# Patient Record
Sex: Female | Born: 1960 | Race: Black or African American | Hispanic: No | Marital: Married | Smoking: Never smoker
Health system: Southern US, Community
[De-identification: ages and names within clinical notes are randomized; demographics above are authoritative.]

## PROBLEM LIST (undated history)

## (undated) DIAGNOSIS — M329 Systemic lupus erythematosus, unspecified: Secondary | ICD-10-CM

## (undated) DIAGNOSIS — J45909 Unspecified asthma, uncomplicated: Secondary | ICD-10-CM

## (undated) DIAGNOSIS — I10 Essential (primary) hypertension: Secondary | ICD-10-CM

## (undated) DIAGNOSIS — IMO0002 Reserved for concepts with insufficient information to code with codable children: Secondary | ICD-10-CM

## (undated) DIAGNOSIS — L438 Other lichen planus: Secondary | ICD-10-CM

## (undated) DIAGNOSIS — Z86711 Personal history of pulmonary embolism: Secondary | ICD-10-CM

## (undated) DIAGNOSIS — Z8619 Personal history of other infectious and parasitic diseases: Secondary | ICD-10-CM

## (undated) HISTORY — PX: TOTAL ABDOMINAL HYSTERECTOMY W/ BILATERAL SALPINGOOPHORECTOMY: SHX83

## (undated) HISTORY — DX: Essential (primary) hypertension: I10

## (undated) HISTORY — DX: Other lichen planus: L43.8

## (undated) HISTORY — DX: Systemic lupus erythematosus, unspecified: M32.9

## (undated) HISTORY — DX: Personal history of other infectious and parasitic diseases: Z86.19

## (undated) HISTORY — DX: Unspecified asthma, uncomplicated: J45.909

## (undated) HISTORY — DX: Personal history of pulmonary embolism: Z86.711

## (undated) HISTORY — DX: Reserved for concepts with insufficient information to code with codable children: IMO0002

---

## 2001-01-15 ENCOUNTER — Other Ambulatory Visit: Admission: RE | Admit: 2001-01-15 | Discharge: 2001-01-15 | Payer: Self-pay | Admitting: Obstetrics and Gynecology

## 2002-10-26 ENCOUNTER — Other Ambulatory Visit: Admission: RE | Admit: 2002-10-26 | Discharge: 2002-10-26 | Payer: Self-pay | Admitting: Obstetrics and Gynecology

## 2004-06-29 ENCOUNTER — Other Ambulatory Visit: Admission: RE | Admit: 2004-06-29 | Discharge: 2004-06-29 | Payer: Self-pay | Admitting: Obstetrics and Gynecology

## 2004-08-30 ENCOUNTER — Encounter (INDEPENDENT_AMBULATORY_CARE_PROVIDER_SITE_OTHER): Payer: Self-pay | Admitting: *Deleted

## 2004-08-30 ENCOUNTER — Ambulatory Visit (HOSPITAL_COMMUNITY): Admission: RE | Admit: 2004-08-30 | Discharge: 2004-08-30 | Payer: Self-pay | Admitting: Obstetrics and Gynecology

## 2007-10-07 ENCOUNTER — Encounter (INDEPENDENT_AMBULATORY_CARE_PROVIDER_SITE_OTHER): Payer: Self-pay | Admitting: Obstetrics and Gynecology

## 2007-10-08 ENCOUNTER — Inpatient Hospital Stay (HOSPITAL_COMMUNITY): Admission: RE | Admit: 2007-10-08 | Discharge: 2007-10-09 | Payer: Self-pay | Admitting: Obstetrics and Gynecology

## 2007-10-19 DIAGNOSIS — Z86711 Personal history of pulmonary embolism: Secondary | ICD-10-CM

## 2007-10-19 HISTORY — DX: Personal history of pulmonary embolism: Z86.711

## 2007-11-05 ENCOUNTER — Inpatient Hospital Stay (HOSPITAL_COMMUNITY): Admission: AD | Admit: 2007-11-05 | Discharge: 2007-11-12 | Payer: Self-pay | Admitting: Obstetrics and Gynecology

## 2007-11-05 ENCOUNTER — Ambulatory Visit: Payer: Self-pay | Admitting: Internal Medicine

## 2007-11-05 IMAGING — CT CT ANGIO CHEST
3 of 4 series · 18 of 30 positions shown · IV contrast (150ml omni/300%)
Comparison: No prior studies are available for comparison.

CLINICAL DATA: 46-year-old with shortness of breath. Pain on inspiration. Hysterectomy one month ago. Evaluate for pulmonary embolism.
CT ANGIOGRAPHY OF CHEST:
TECHNIQUE: Multidetector CT imaging of the chest was performed during bolus injection of intravenous contrast.  Multiplanar CT angiographic image reconstructions were generated to evaluate the vascular anatomy.
Contrast:  150 cc Omnipaque 300.

[Series 4: recon 3: pe chest · axial · 0.58mm/px · z∈[-213,-24]mm · 11 of 231 slices shown]
[im 21/231  lung]
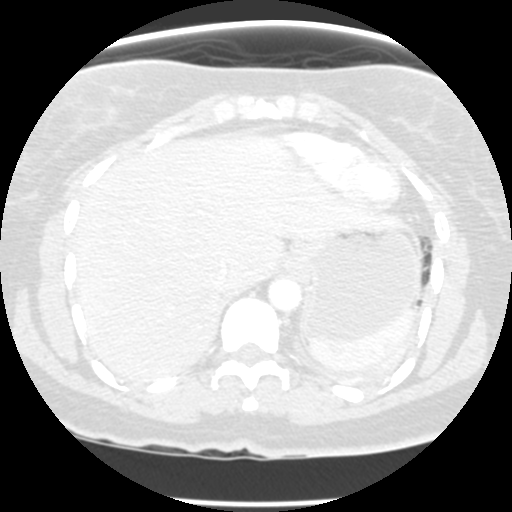
[im 42/231  mediastinal]
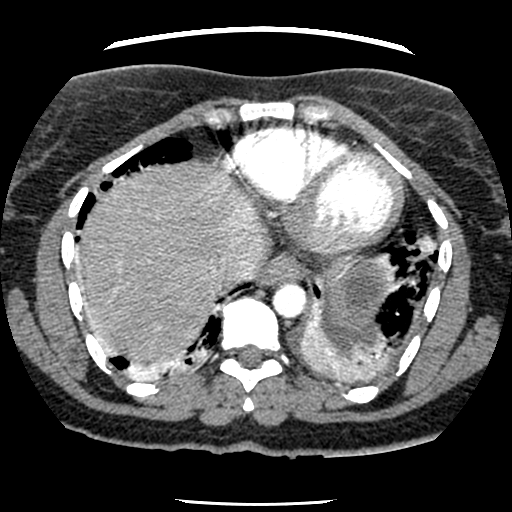
[im 63/231  lung]
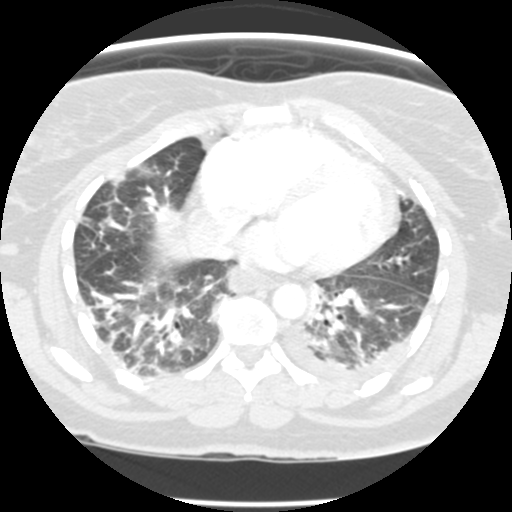
[im 84/231  mediastinal]
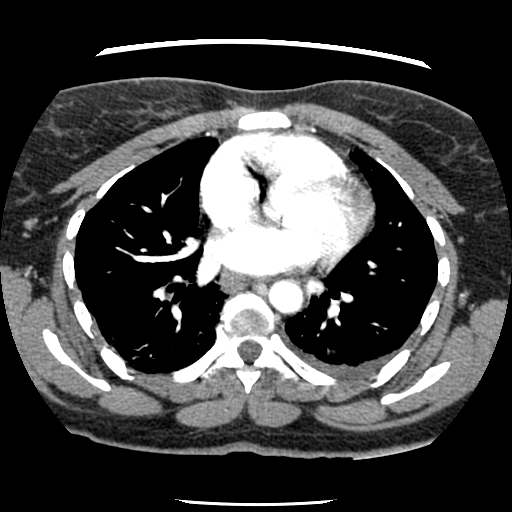
[im 105/231  lung]
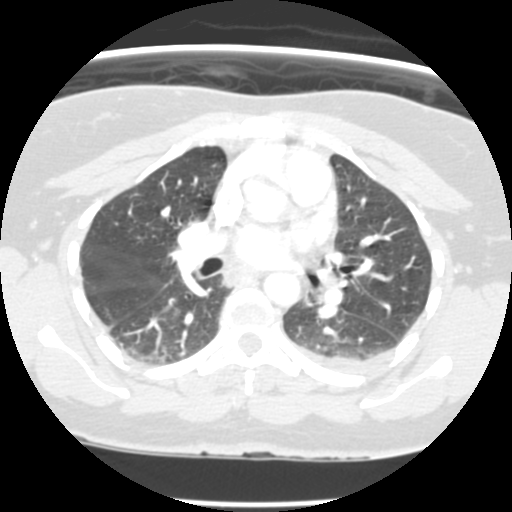
[im 116/231  mediastinal]
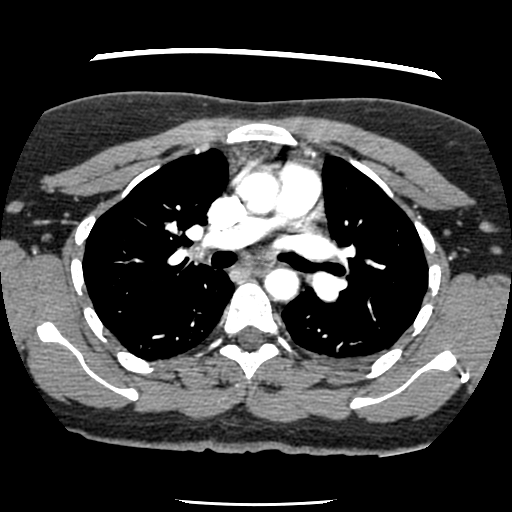
[im 126/231  lung]
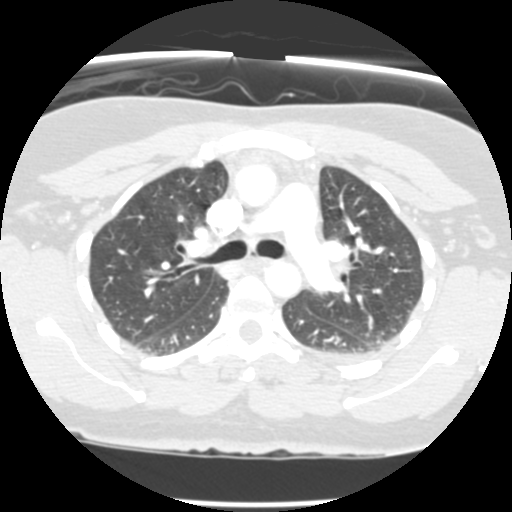
[im 147/231  mediastinal]
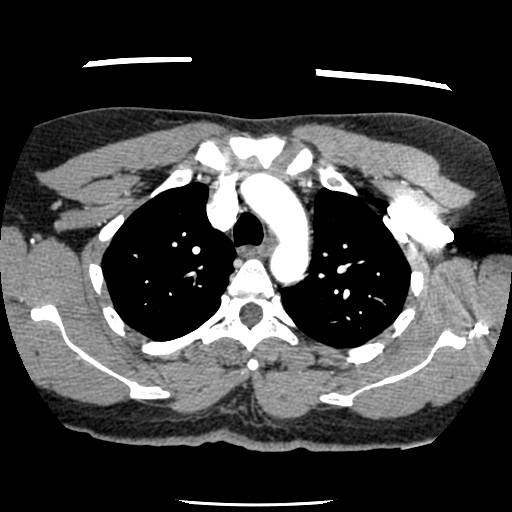
[im 168/231  lung]
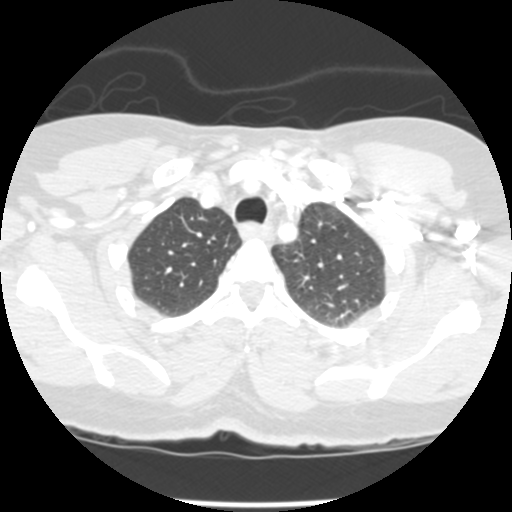
[im 189/231  mediastinal]
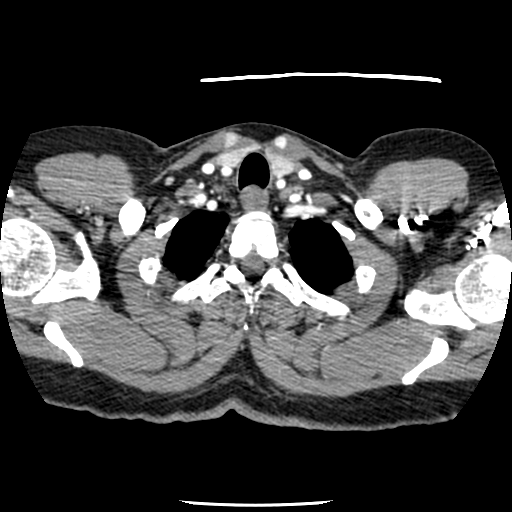
[im 210/231  lung]
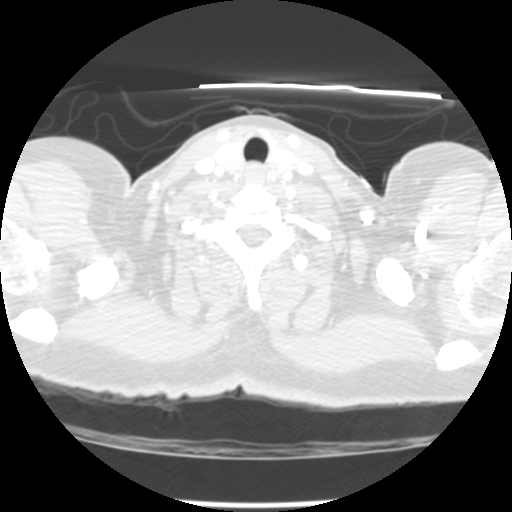

[Series 400: reformatted · coronal · 0.58mm/px · 3 of 86 slices shown (1 of 2)]
[im 22/86  lung]
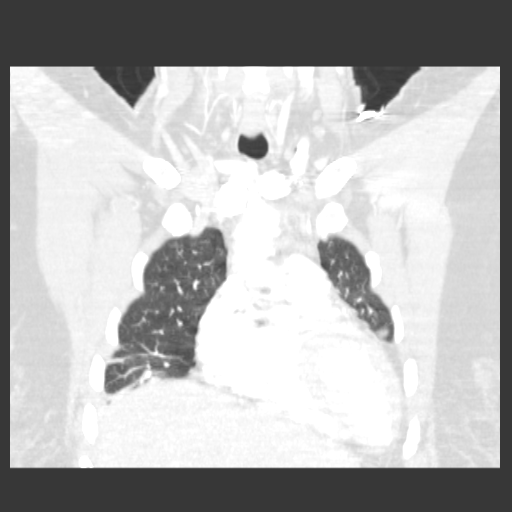
[im 43/86  lung]
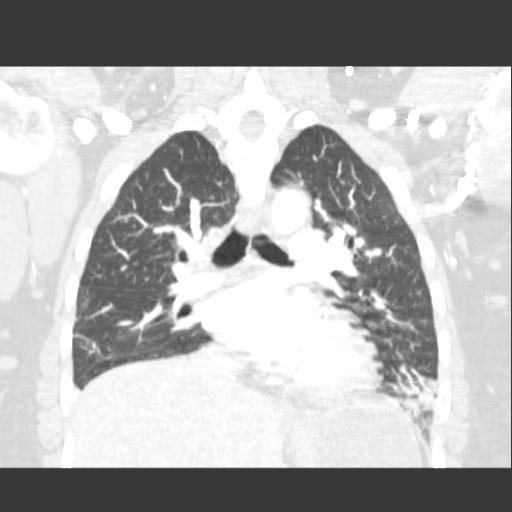
[im 64/86  lung]
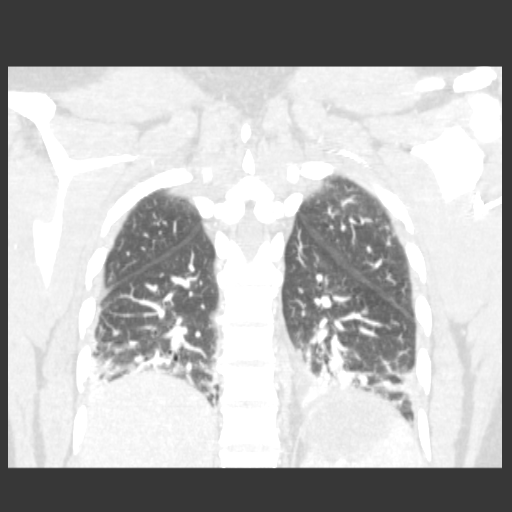

[Series 401: reformatted · sagittal · 0.58mm/px · 4 of 121 slices shown (2 of 2)]
[im 25/121  lung]
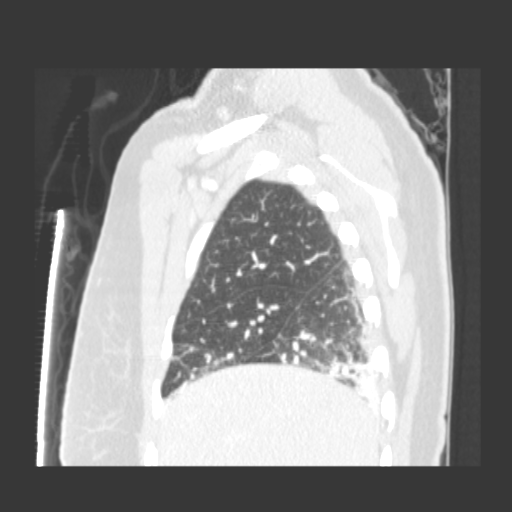
[im 49/121  lung]
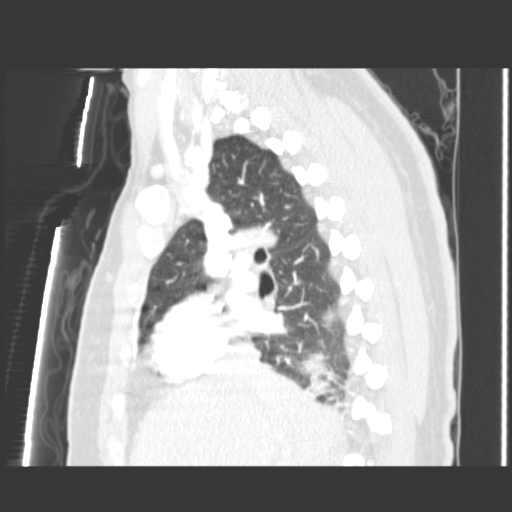
[im 73/121  lung]
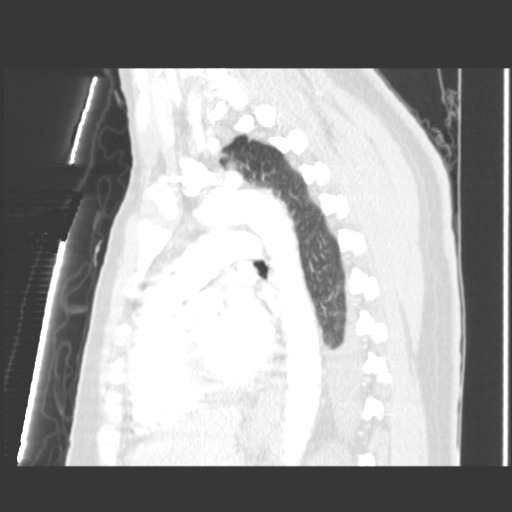
[im 97/121  lung]
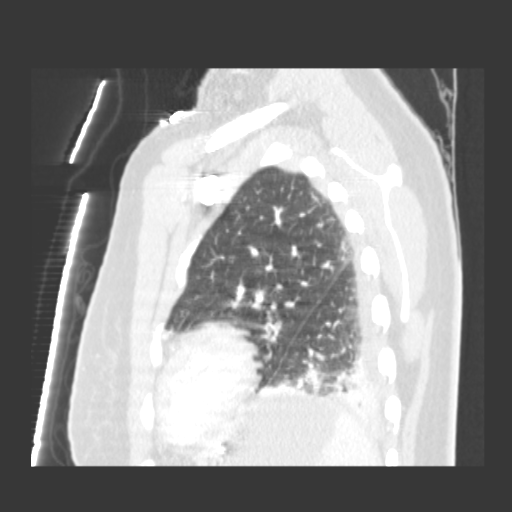

[18 of 30 positions shown; findings below may reference images not displayed]

FINDINGS: There are filling defects in a subsegmental branch of the left lower lobe (images #[id number] of series 4), consistent with pulmonary embolism. Additionally, there are filling defects in a subsegmental branch of the right lower lobe (image #[id number] of series 4).  There is significant atelectasis at both lung bases, somewhat limiting evaluation of some of the additional subsegmental pulmonary arteries.
No filling defects are identified in the main pulmonary arteries or the lobar branches.  No definite evidence of right heart strain is identified.  There is a small left pleural effusion. 
Extensive atelectasis in both lower lobe and focal areas of atelectasis in the right middle lobe and lingula. No discrete consolidation is identified. The trachea and mainstem bronchi are patent. The visualized thoracic spine vertebral bodies are aligned. The vertebral bodies are normal in height. 
The thoracic aorta is normal in caliber and contour. No dissection is identified. 
No mediastinal or hilar lymphadenopathy is identified.
IMPRESSION: 1.  Positive for pulmonary emboli to both lower lobes. This finding was called to Dr. PREM at [DATE] a.m. on [DATE].
2.  Small left pleural effusion.
3.  Extensive bilateral lower lobe atelectasis and scattered atelectasis in the right middle lobe and lingula.

## 2007-11-06 ENCOUNTER — Encounter (INDEPENDENT_AMBULATORY_CARE_PROVIDER_SITE_OTHER): Payer: Self-pay | Admitting: Internal Medicine

## 2007-11-06 ENCOUNTER — Ambulatory Visit: Payer: Self-pay | Admitting: Surgery

## 2007-11-06 IMAGING — CR DG CHEST 1V PORT
1 series · 1 of 1 positions shown · non-contrast
Comparison: CT chest [DATE].

CLINICAL DATA: Pulmonary embolism.  
 PORTABLE CHEST - 1 VIEW [DATE]:

[view not recorded]
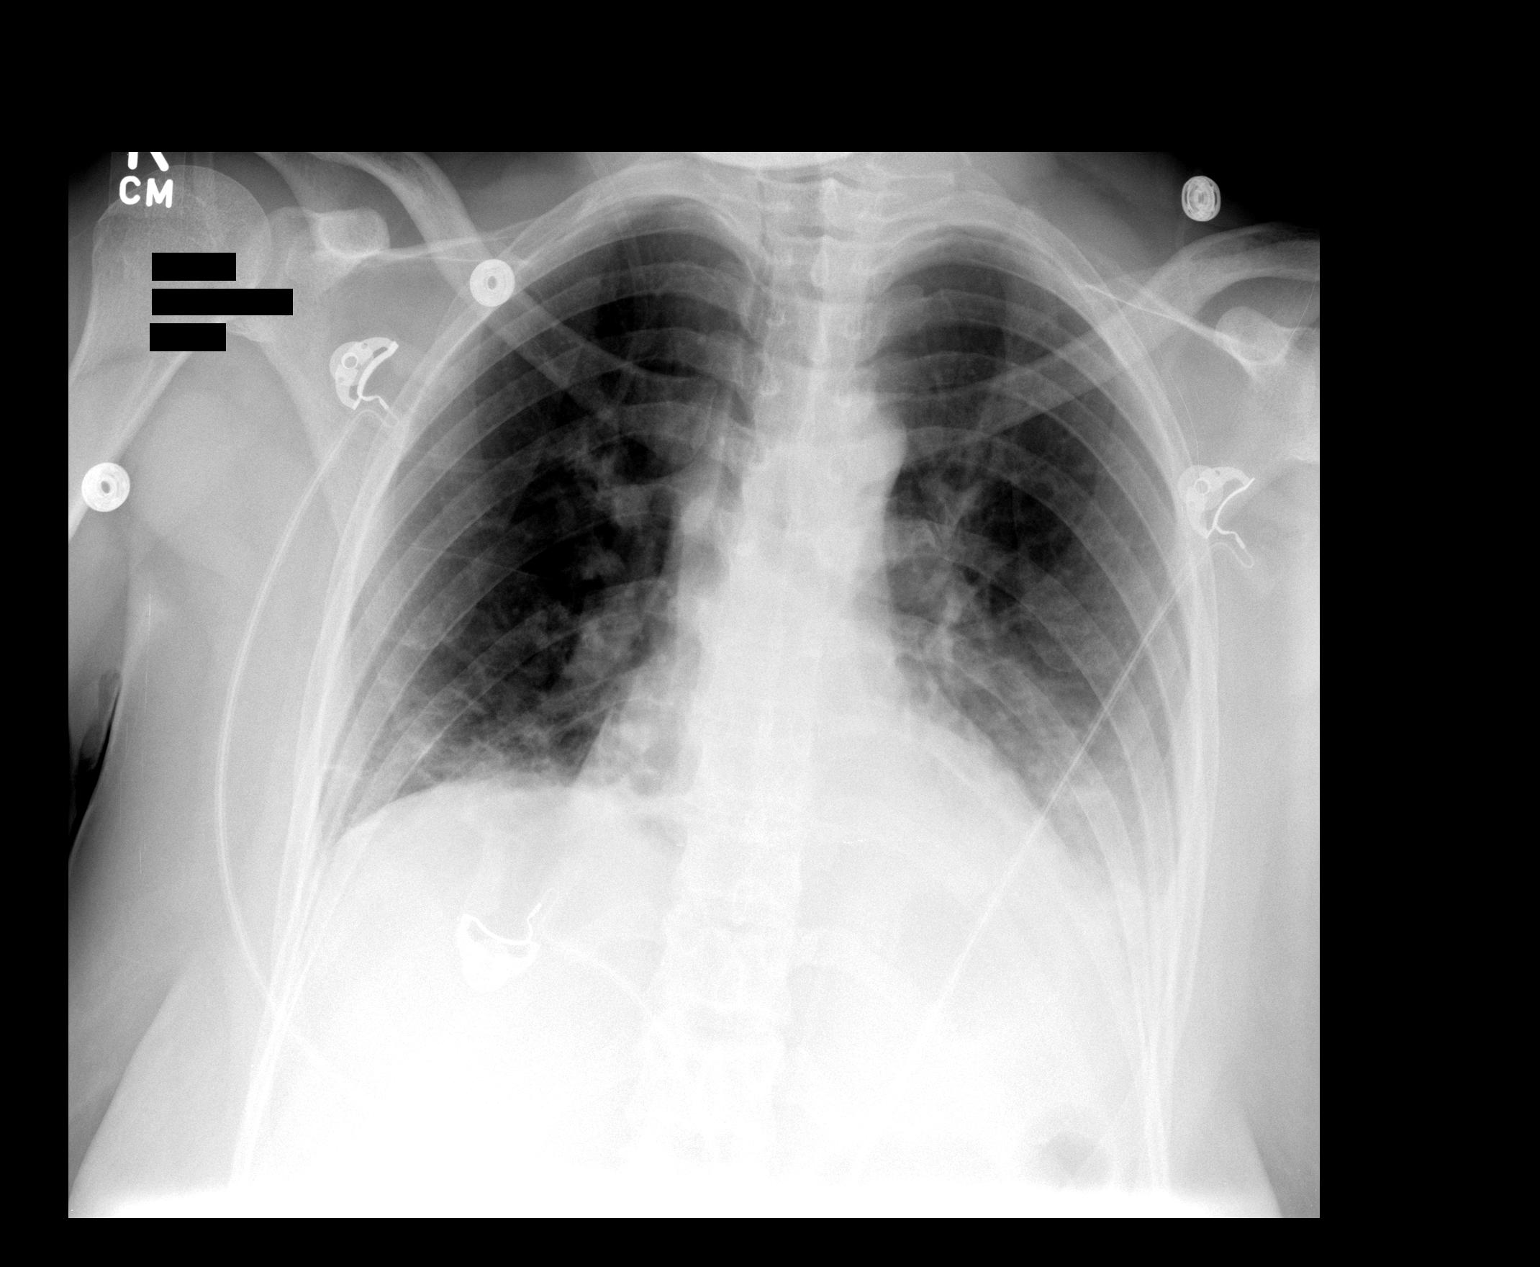

[1 of 1 positions shown; findings below may reference images not displayed]

FINDINGS: Trachea is midline.  Heart is at the upper limits of normal to mildly enlarged.  Left pleural effusion and bibasilar atelectasis.
IMPRESSION: Left pleural effusion and bibasilar atelectasis.

## 2007-11-08 ENCOUNTER — Encounter: Payer: Self-pay | Admitting: Internal Medicine

## 2007-11-08 IMAGING — CR DG CHEST 1V PORT
1 series · 1 of 1 positions shown · non-contrast
Comparison: [DATE]

CLINICAL DATA: Pulmonary embolism, followup.    
 PORTABLE CHEST - 1 VIEW:

[view not recorded]
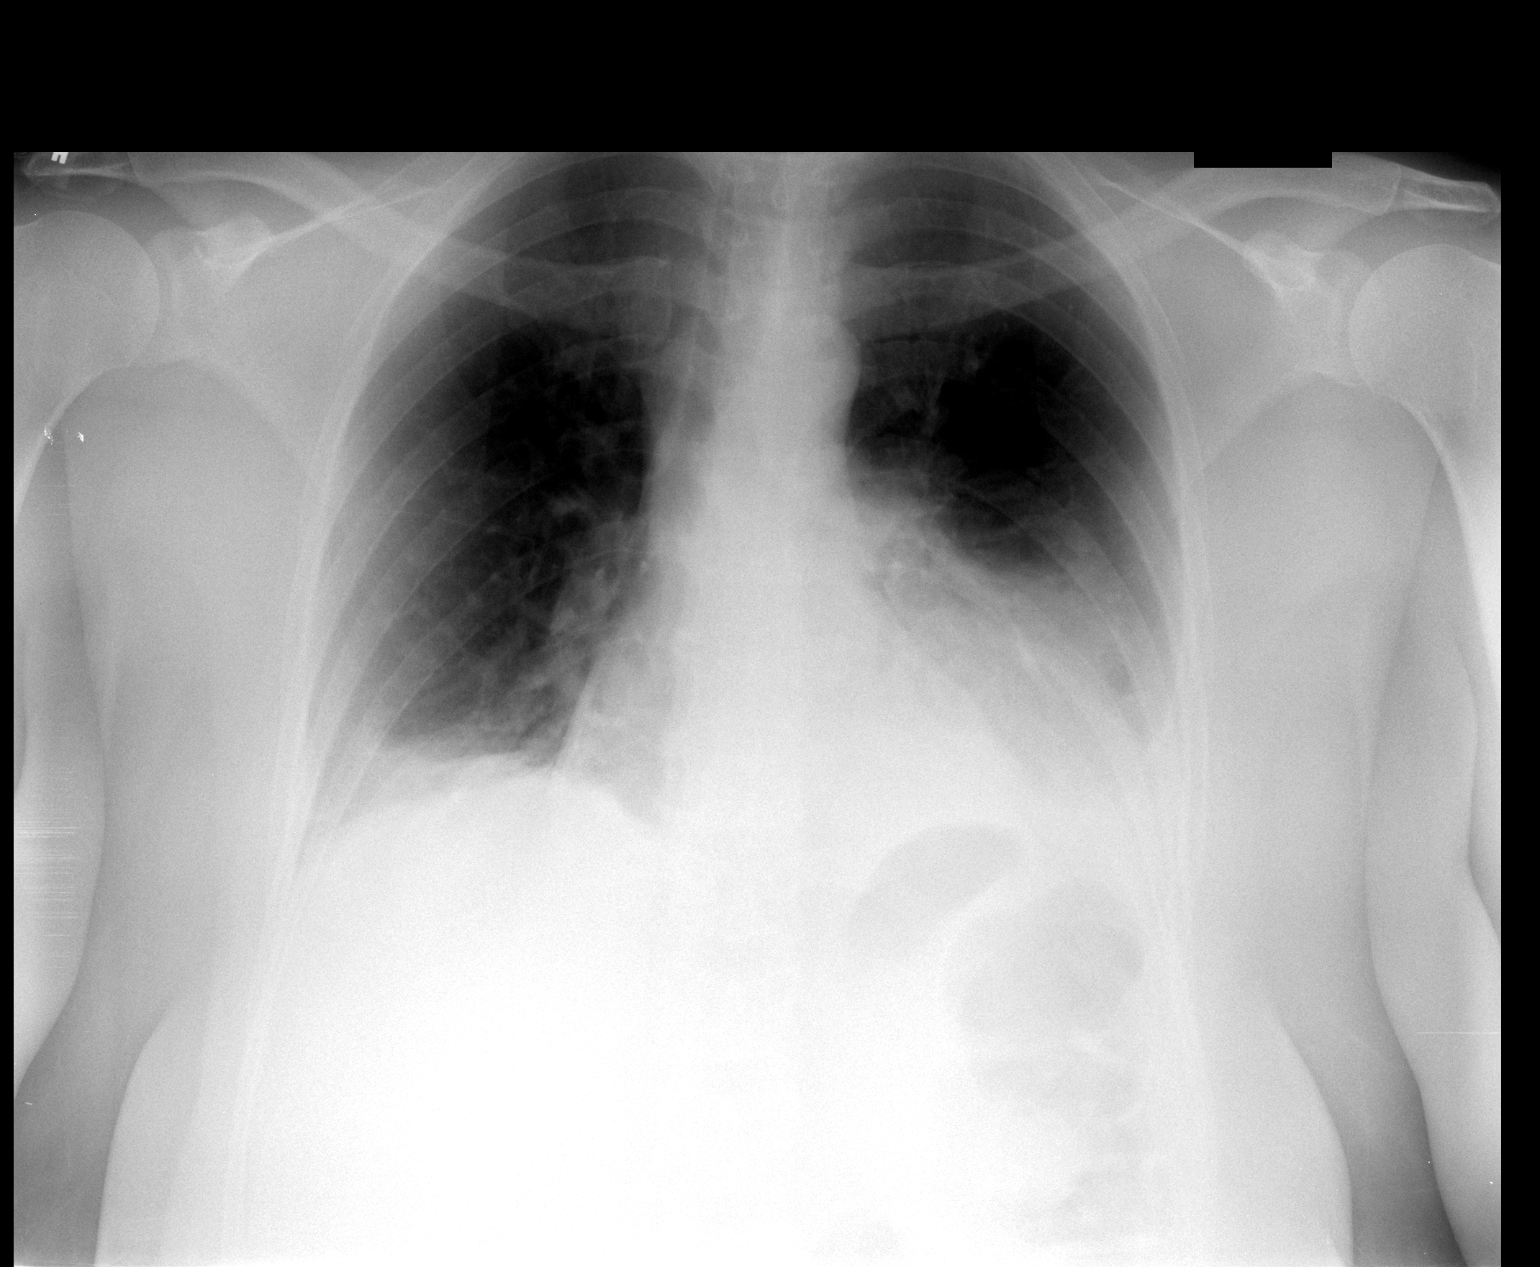

[1 of 1 positions shown; findings below may reference images not displayed]

FINDINGS: Heart size is normal.  Lung volumes are suboptimal.  Bibasilar atelectasis and trace effusions.  No change.
IMPRESSION: No change in trace effusions.

## 2007-11-10 IMAGING — CR DG CHEST 2V
2 series · 2 of 2 positions shown · non-contrast
Comparison: [DATE] and CT chest [DATE].

CLINICAL DATA: Pulmonary embolism.  Shortness of breath.
 [AP] VIEWS:

[w chest pa]
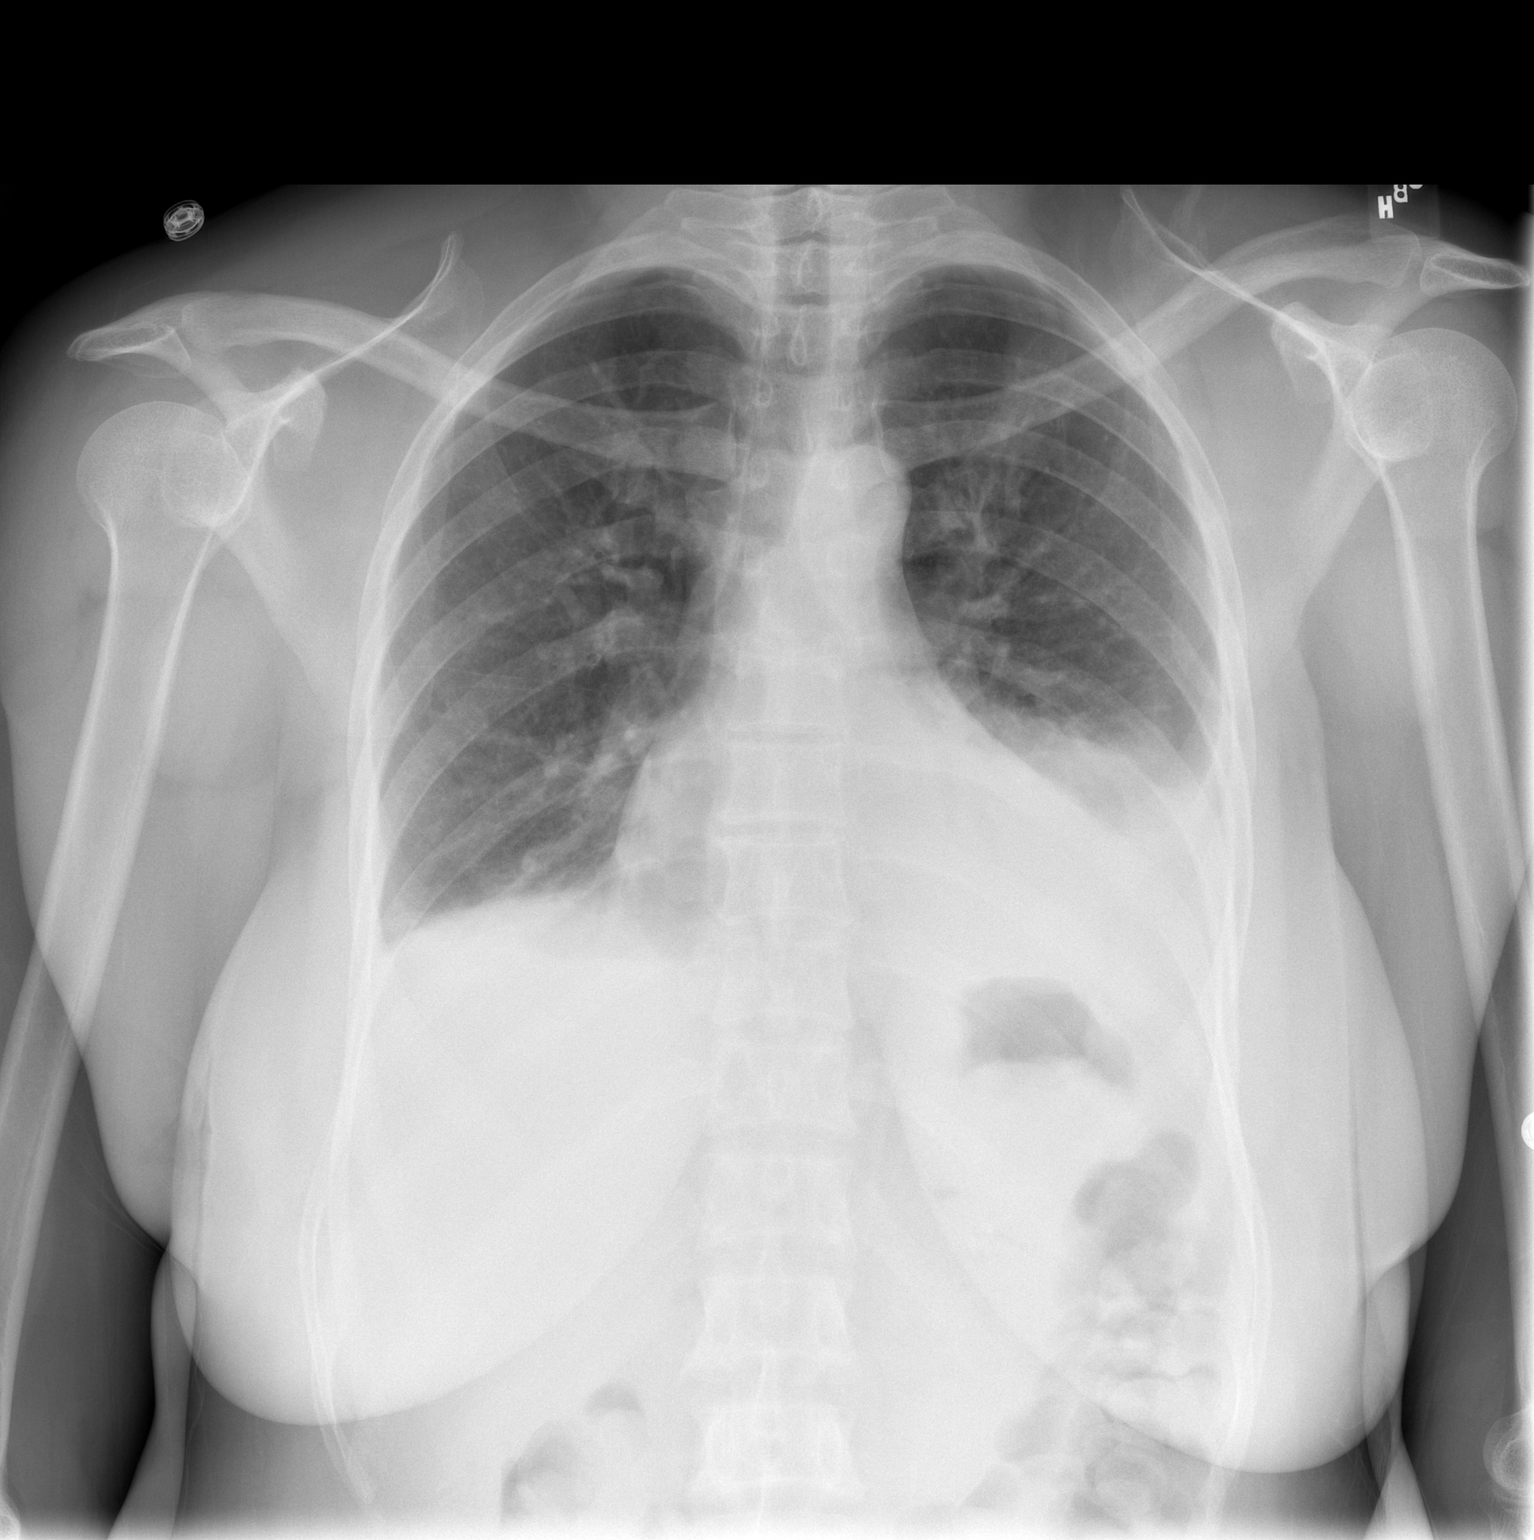

[w chest lat]
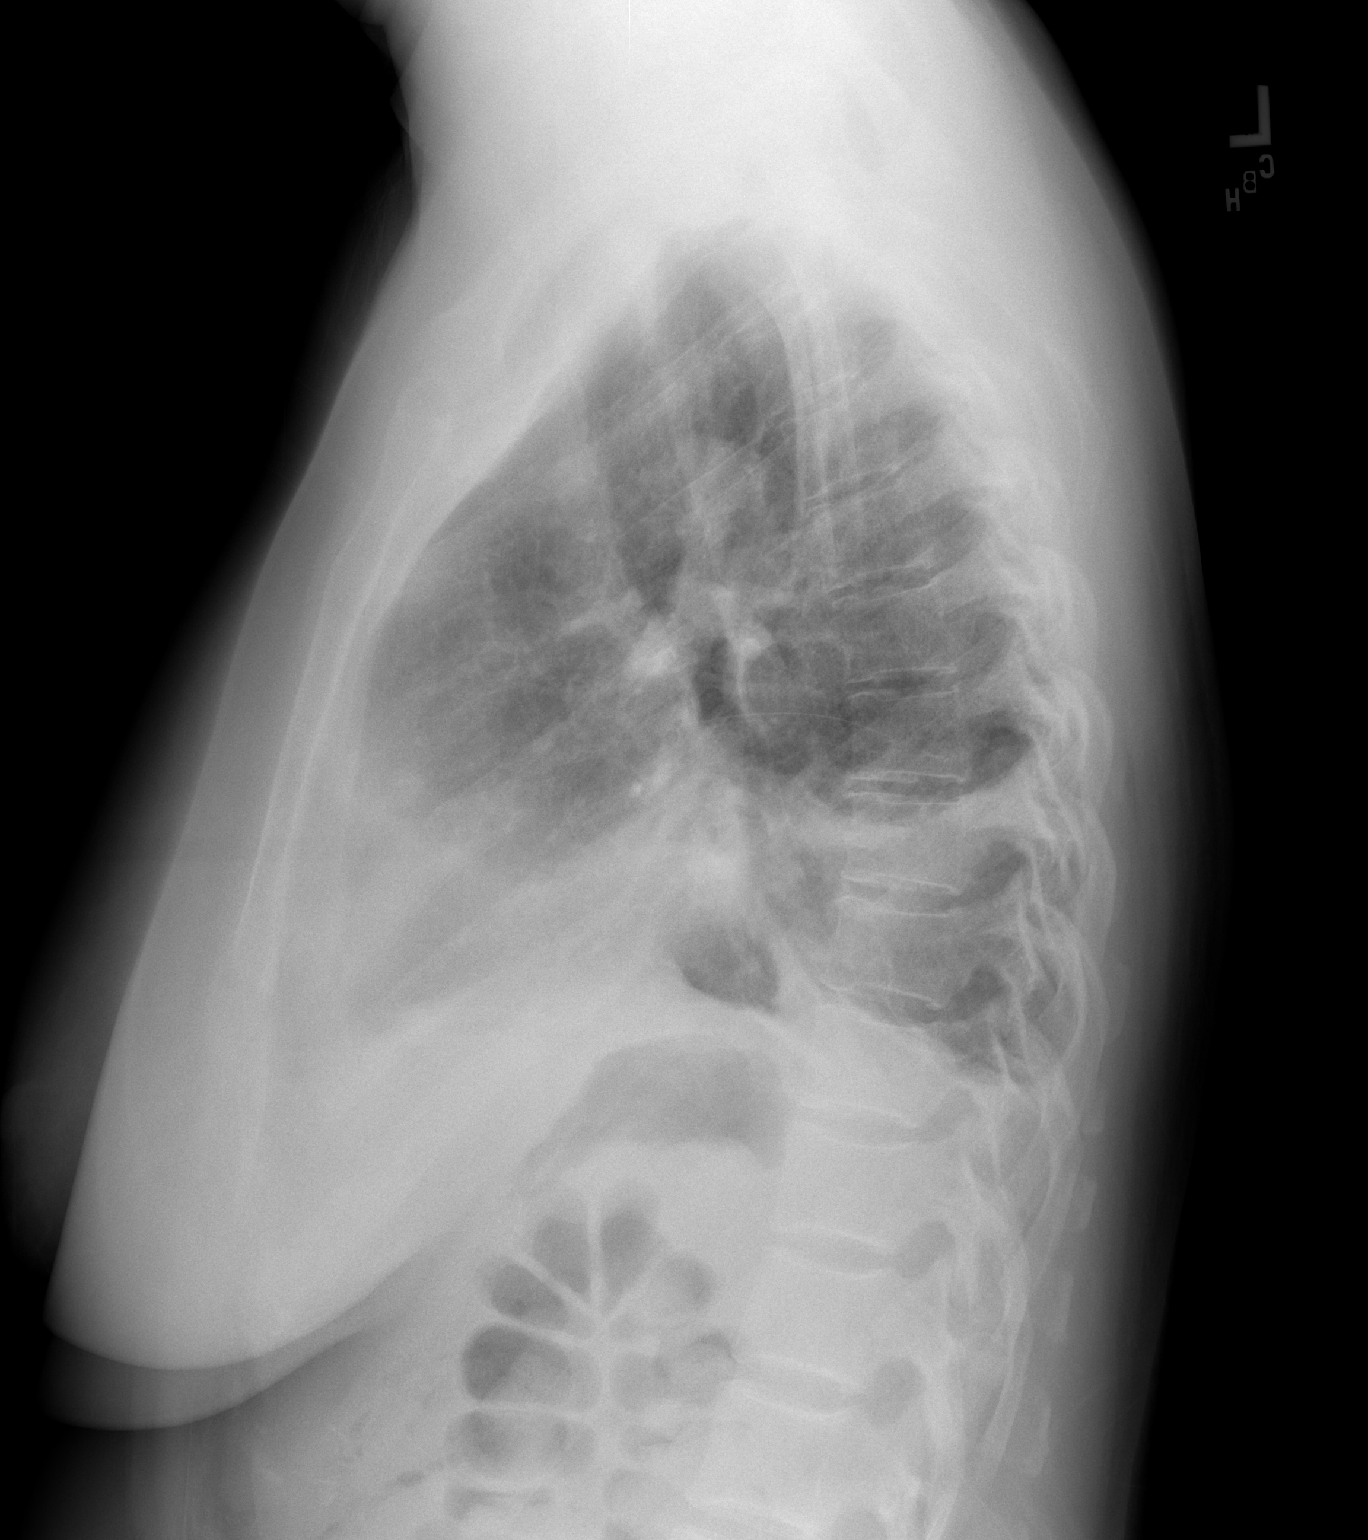

[2 of 2 positions shown; findings below may reference images not displayed]

FINDINGS: Left pleural effusion and left lower lobe airspace disease.  Trace right pleural effusion.
IMPRESSION: 1.  Left pleural effusion and left lower lobe airspace disease which may represent pulmonary infarction.
 2.  Trace right pleural effusion.

## 2007-11-11 IMAGING — CT CT ANGIO CHEST
2 series · 19 of 30 positions shown · IV contrast (100 ML OMNI 300)
Comparison: CT [DATE].

CLINICAL DATA: Left-sided chest pain.  Known pulmonary embolism. 
CT ANGIOGRAPHY OF CHEST ? [DATE]:
TECHNIQUE: Multidetector CT imaging of the chest was performed during bolus injection of intravenous contrast.  Multiplanar CT angiographic image reconstructions were generated to evaluate the vascular anatomy.
Contrast:  100 cc Omnipaque 300.

[Series 201: reformatted · sagittal · 0.60mm/px · 17 of 135 slices shown (1 of 2)]
[im 9/135  lung]
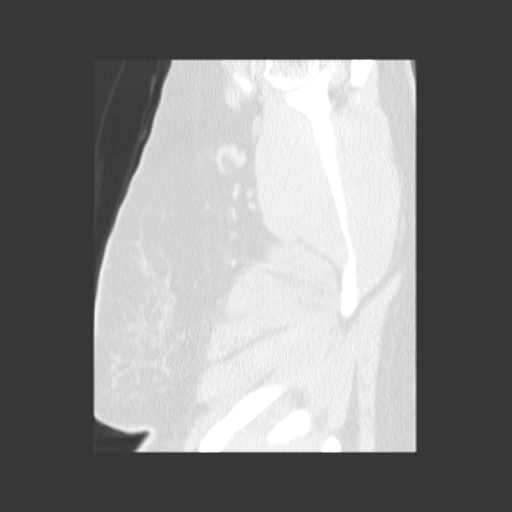
[im 17/135  mediastinal]
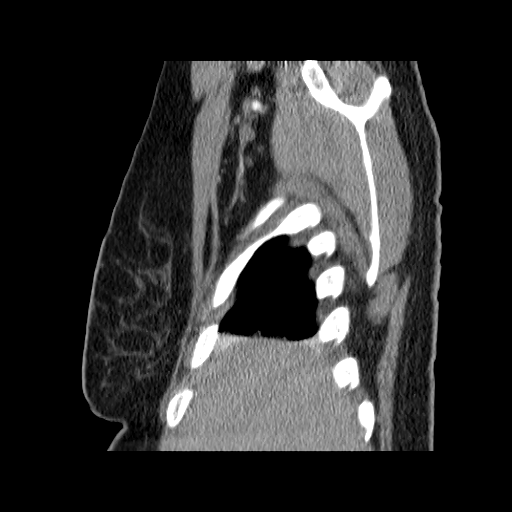
[im 26/135  lung]
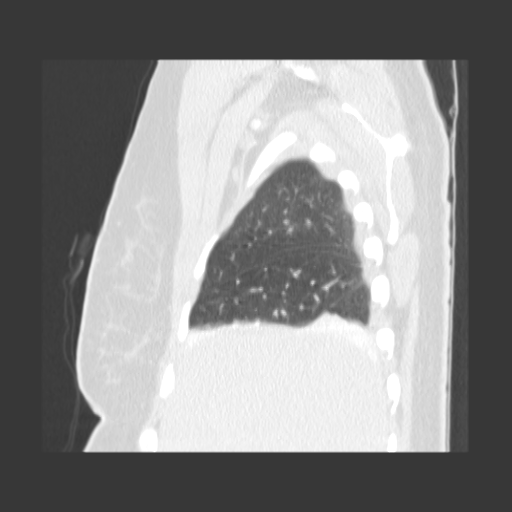
[im 34/135  mediastinal]
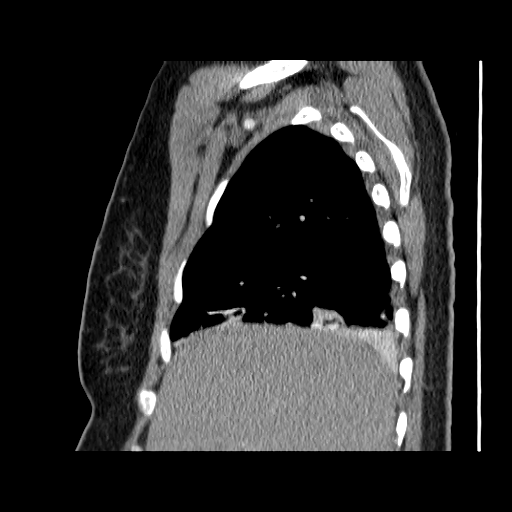
[im 42/135  lung]
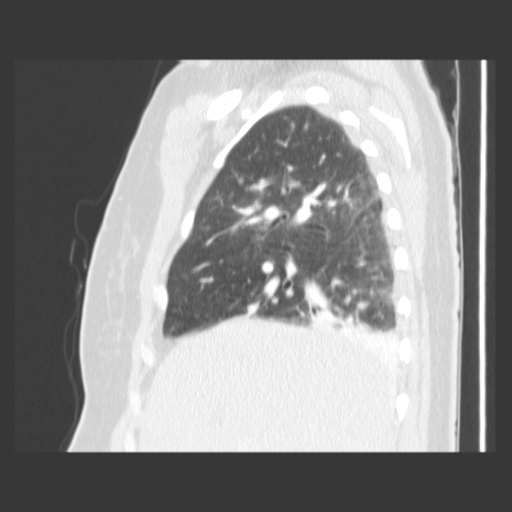
[im 45/135  mediastinal]
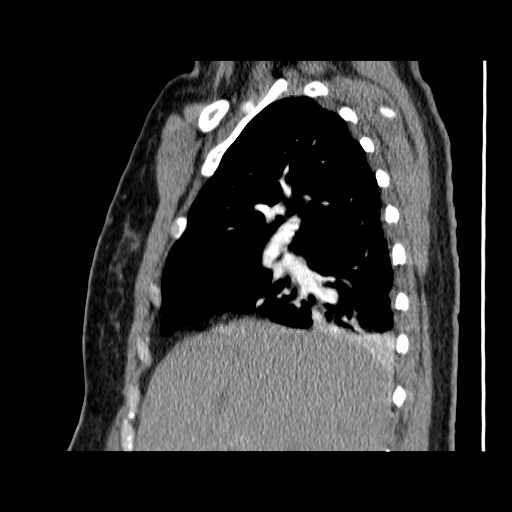
[im 51/135  lung]
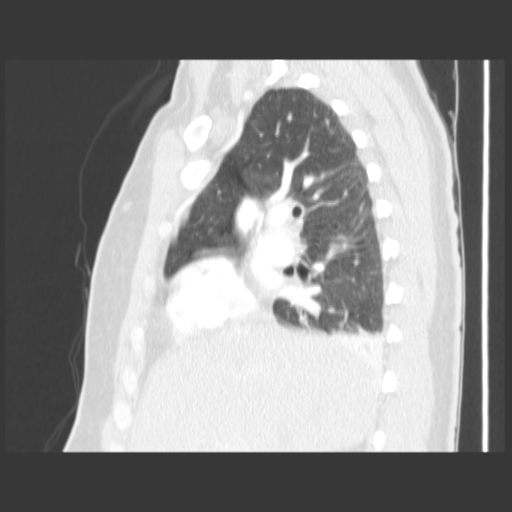
[im 59/135  mediastinal]
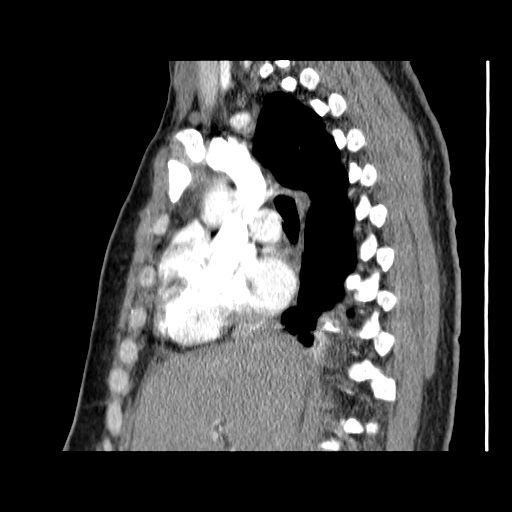
[im 68/135  lung]
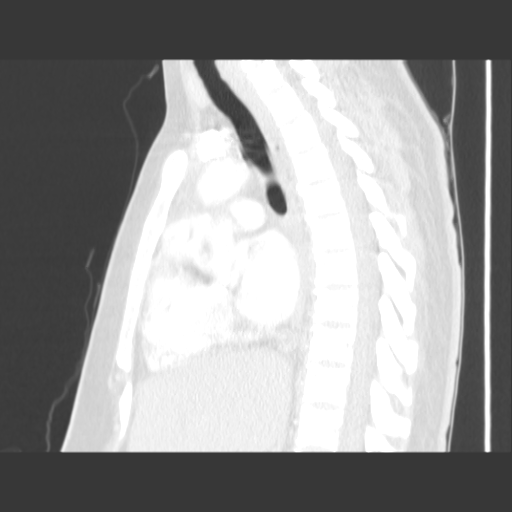
[im 76/135  mediastinal]
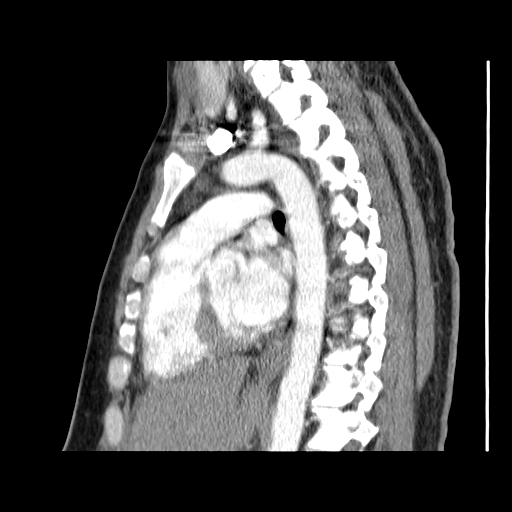
[im 84/135  lung]
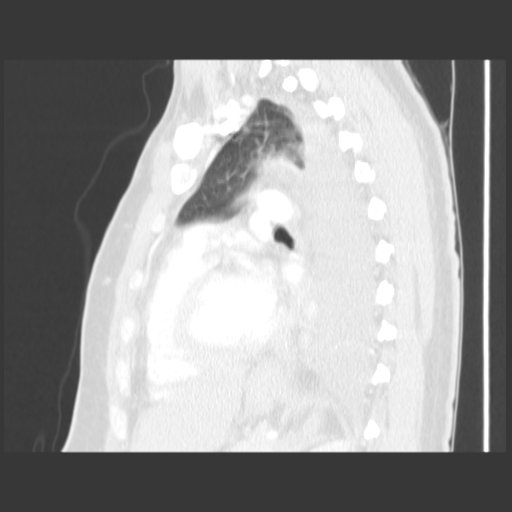
[im 90/135  mediastinal]
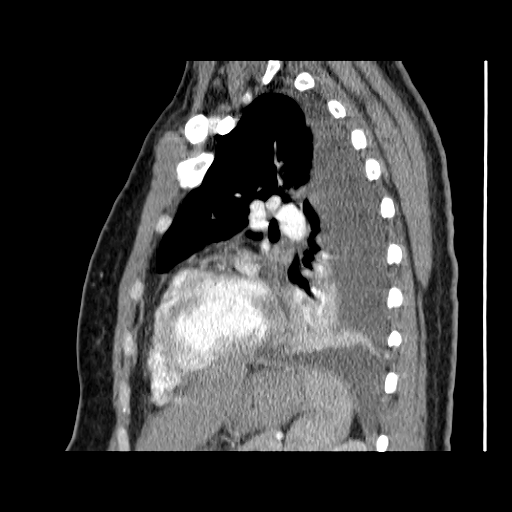
[im 93/135  lung]
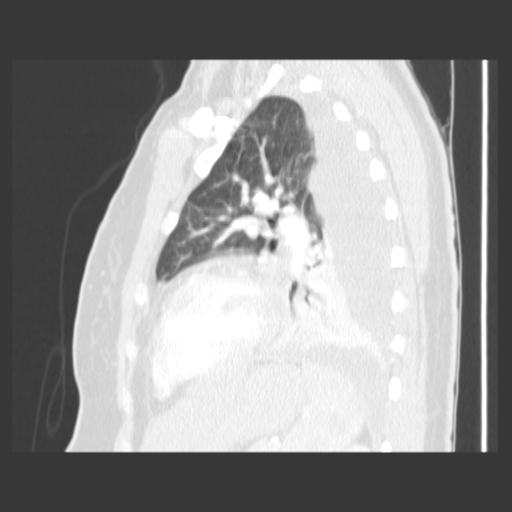
[im 101/135  mediastinal]
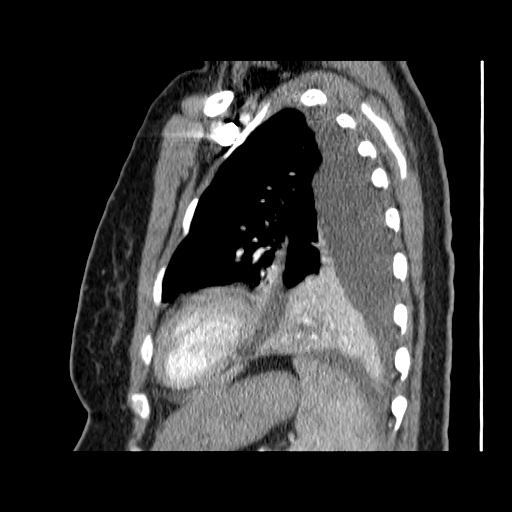
[im 109/135  lung]
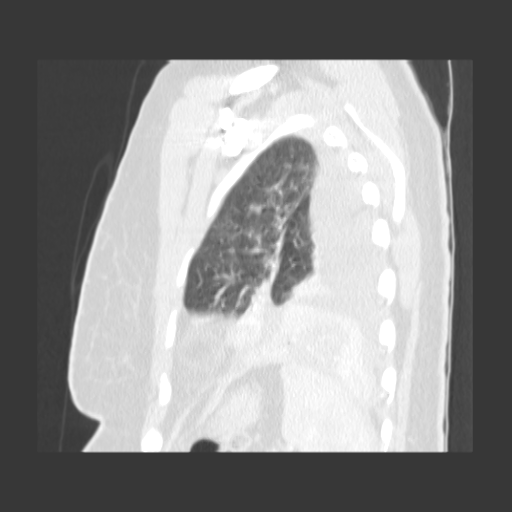
[im 118/135  mediastinal]
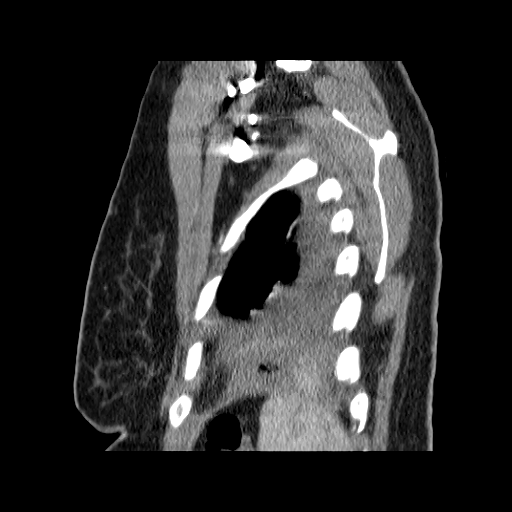
[im 126/135  lung]
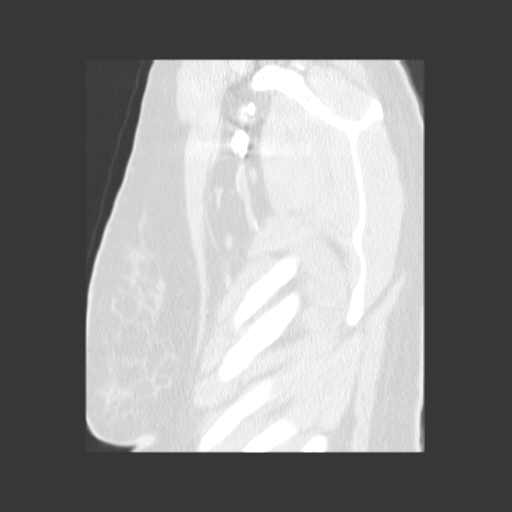

[Series 202: reformatted · coronal · 0.60mm/px · 2 of 92 slices shown (2 of 2)]
[im 10/92  lung]
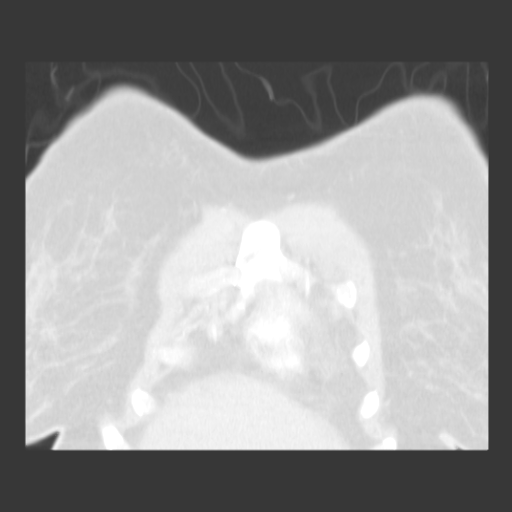
[im 19/92  lung]
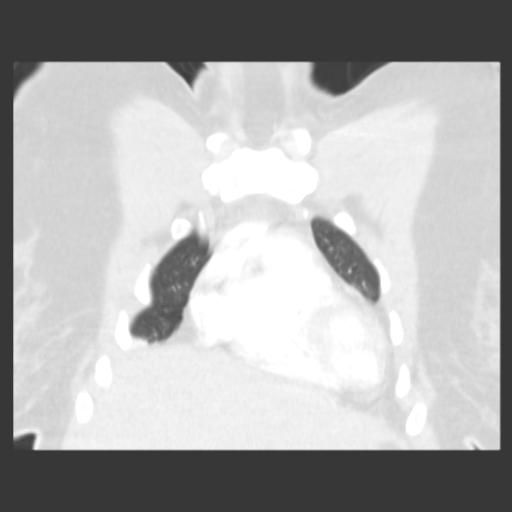

[19 of 30 positions shown; findings below may reference images not displayed]

FINDINGS: The examination is suboptimal for evaluation for pulmonary emboli due to early bolus timing.  The main and second order pulmonary arteries are adequately evaluated but more distal subsegmental embolism may not be as readily apparent.  No new central filling defect is seen to suggest acute pulmonary embolism.  The previously seen subsegmental emboli are not identified today, possibly due to early bolus timing, clot resorption or both.  Left pleural effusion has increased in size, now moderate.  There is heterogeneous perfusion to the atelectatic left lower lobe, suggestive of infarct.  Bibasilar atelectasis has increased since the prior study.   Heart size is at the upper limits of normal.  There is no evidence of evidence of right heart strain.
IMPRESSION: 1.  No new central pulmonary embolism identified, although the study is suboptimal for evaluation of more distal subsegmental emboli.  
2.  Increased bibasilar atelectasis, with suggestion of pulmonary infarct with heterogeneous perfusion to the left lower lobe.  
3.  Increased, now moderate-sized left pleural effusion.  
4.  No evidence for right heart strain.

## 2007-11-14 ENCOUNTER — Ambulatory Visit: Payer: Self-pay | Admitting: Cardiology

## 2007-11-18 ENCOUNTER — Ambulatory Visit: Payer: Self-pay | Admitting: Pulmonary Disease

## 2007-11-18 DIAGNOSIS — Z86711 Personal history of pulmonary embolism: Secondary | ICD-10-CM | POA: Insufficient documentation

## 2007-11-18 DIAGNOSIS — I2699 Other pulmonary embolism without acute cor pulmonale: Secondary | ICD-10-CM | POA: Insufficient documentation

## 2007-11-19 ENCOUNTER — Ambulatory Visit: Payer: Self-pay | Admitting: Internal Medicine

## 2007-11-19 LAB — CONVERTED CEMR LAB: Prothrombin Time: 20.1 s — ABNORMAL HIGH (ref 10.9–13.3)

## 2007-11-24 ENCOUNTER — Telehealth (INDEPENDENT_AMBULATORY_CARE_PROVIDER_SITE_OTHER): Payer: Self-pay | Admitting: *Deleted

## 2007-11-27 ENCOUNTER — Ambulatory Visit: Payer: Self-pay | Admitting: Internal Medicine

## 2007-12-04 ENCOUNTER — Ambulatory Visit: Payer: Self-pay | Admitting: Cardiology

## 2007-12-18 ENCOUNTER — Ambulatory Visit: Payer: Self-pay | Admitting: Cardiology

## 2007-12-23 ENCOUNTER — Ambulatory Visit: Payer: Self-pay | Admitting: Internal Medicine

## 2008-01-08 ENCOUNTER — Ambulatory Visit: Payer: Self-pay | Admitting: Internal Medicine

## 2008-01-16 ENCOUNTER — Ambulatory Visit: Payer: Self-pay | Admitting: Pulmonary Disease

## 2008-01-16 IMAGING — CR DG CHEST 2V
2 series · 2 of 2 positions shown · non-contrast
Comparison: [DATE]

CLINICAL DATA: Pulmonary embolus follow-up

CHEST - 2 VIEW

[view not recorded (1 of 2)]
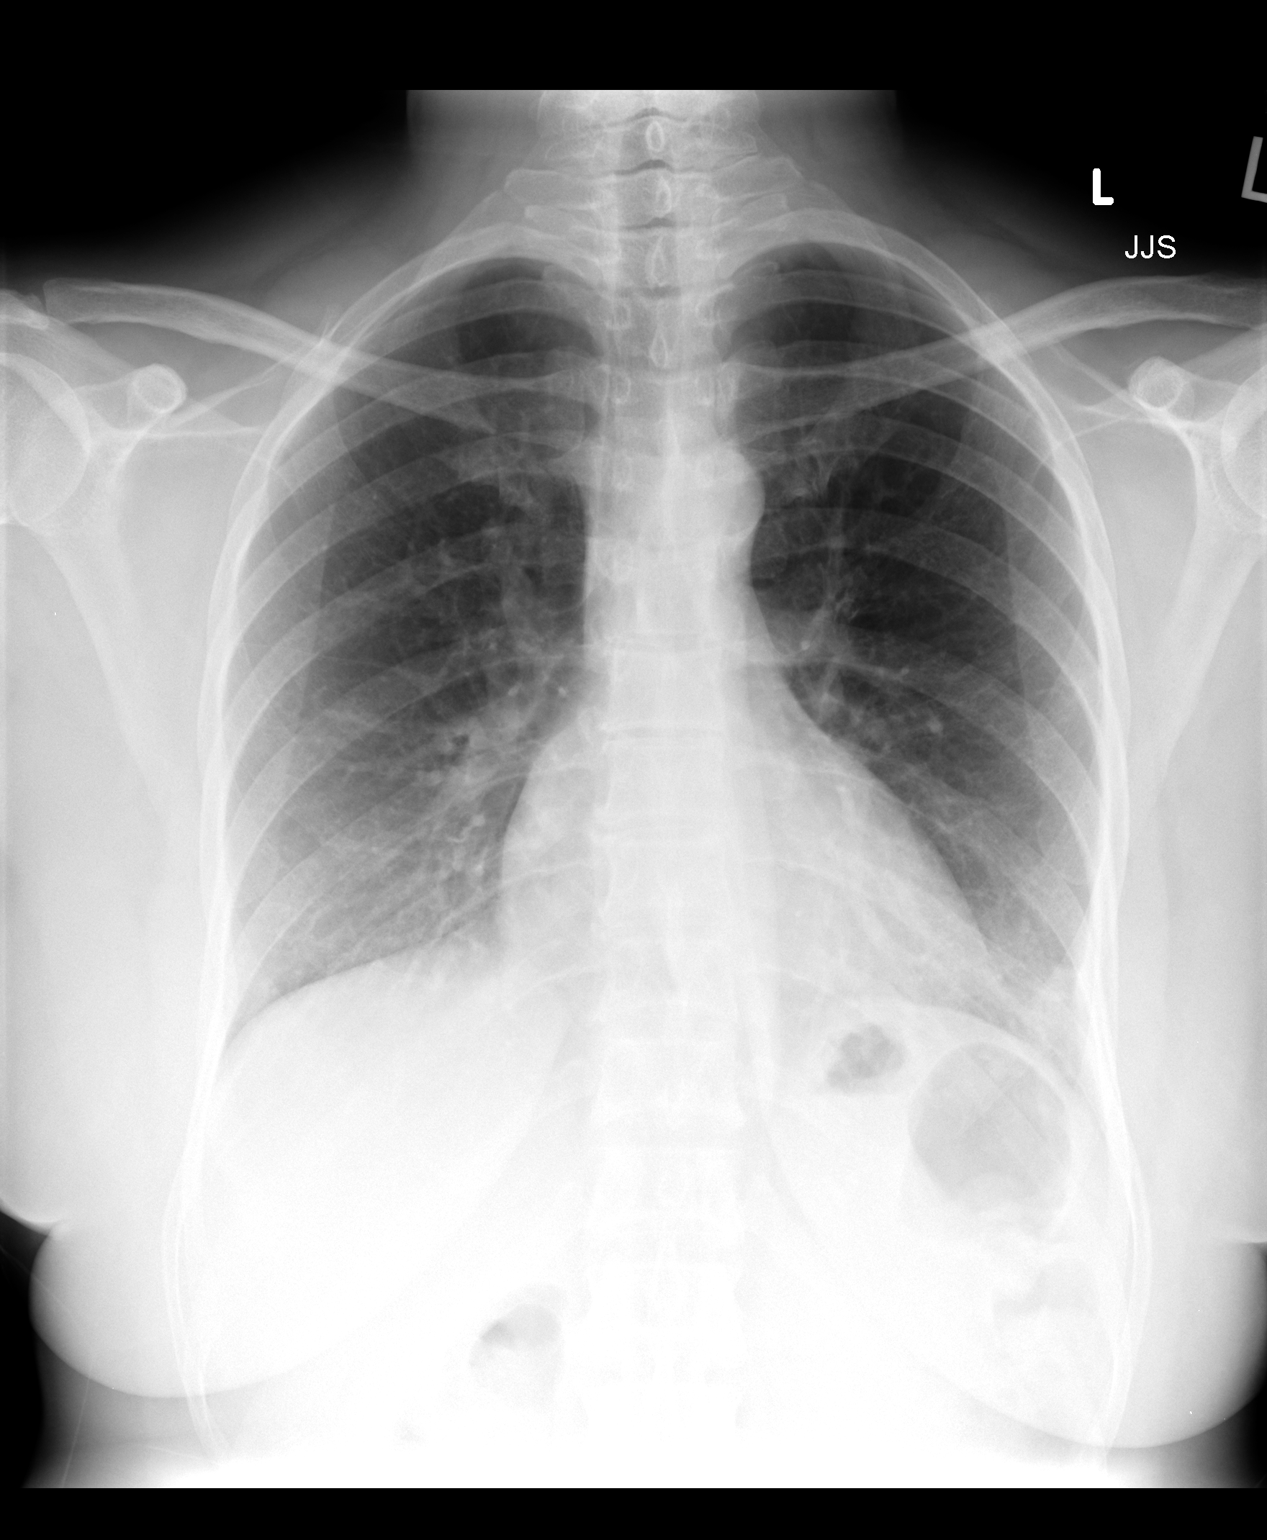

[view not recorded (2 of 2)]
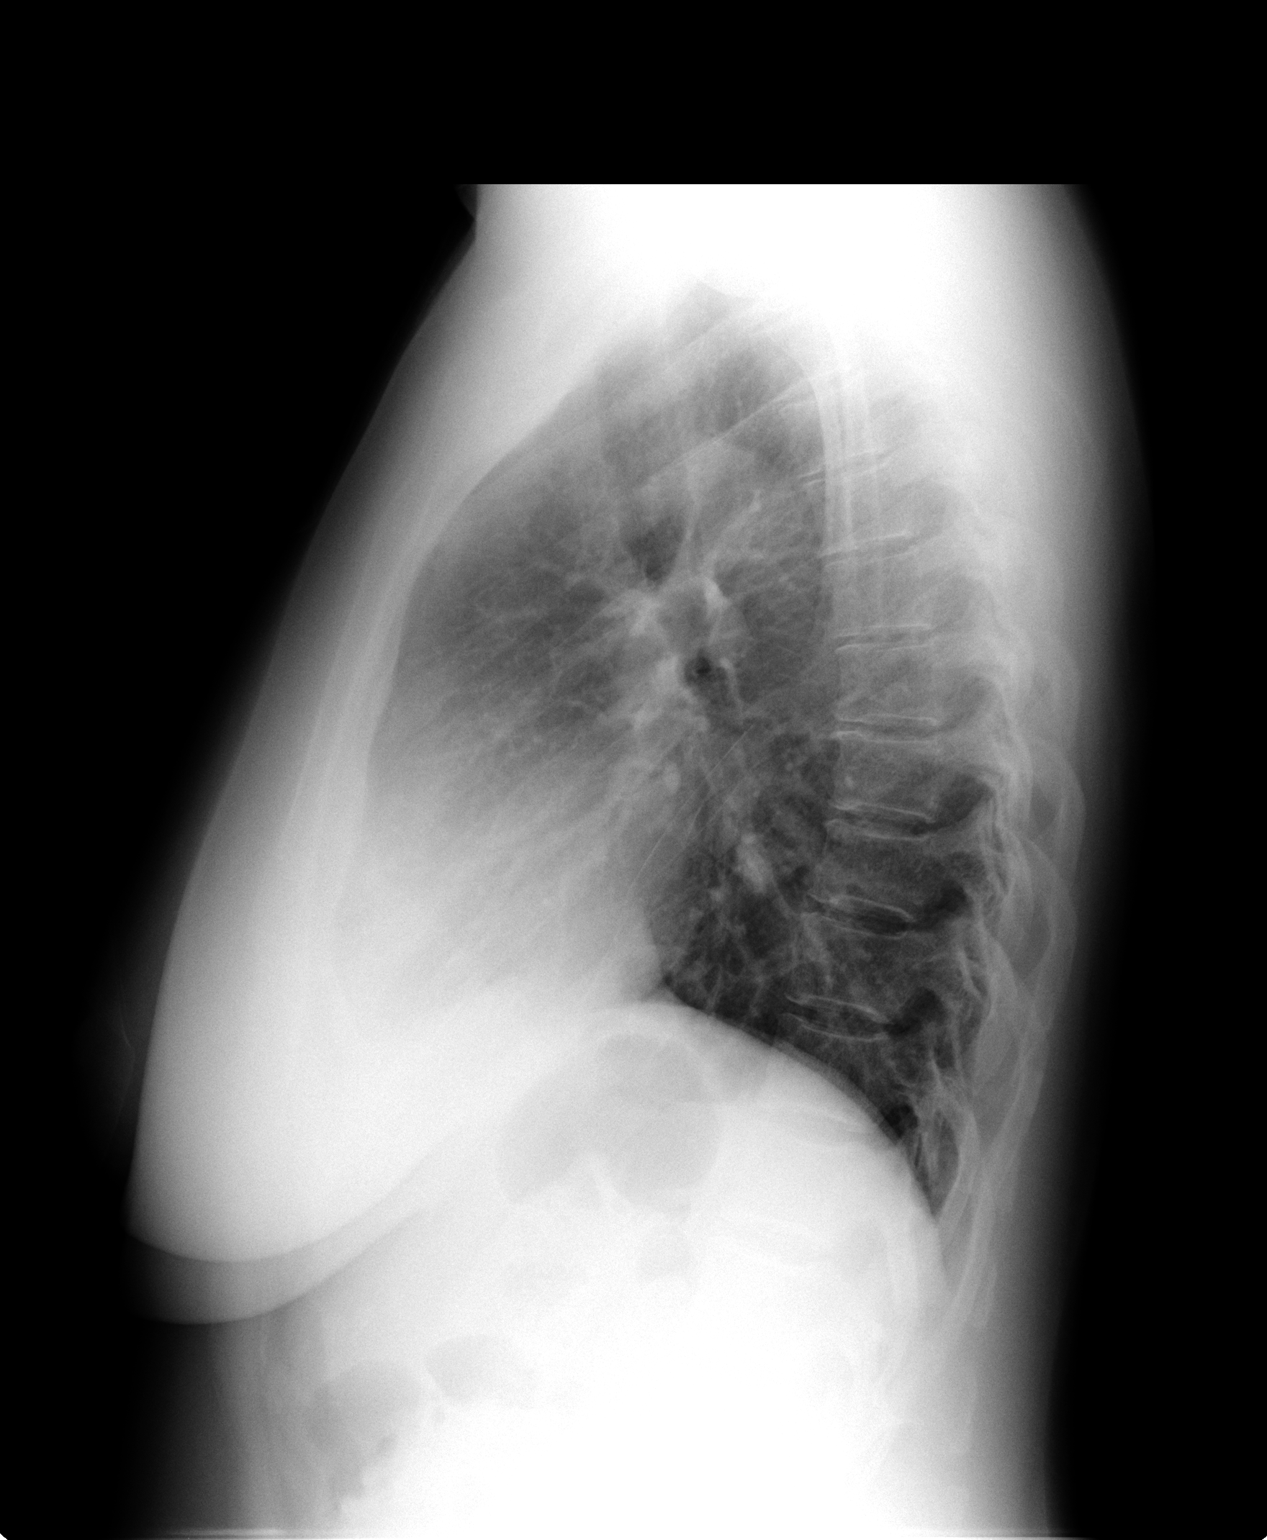

[2 of 2 positions shown; findings below may reference images not displayed]

FINDINGS: The cardiomediastinal silhouette is stable.  No acute
infiltrate or pleural effusion.  Small amount of left basilar
atelectasis or scarring noted.  No pulmonary edema.  Bony thorax is
unremarkable.
IMPRESSION: No acute infiltrate or edema.  Small amount of left basilar
atelectasis or scarring.

## 2008-02-05 ENCOUNTER — Ambulatory Visit: Payer: Self-pay | Admitting: Cardiology

## 2008-02-26 ENCOUNTER — Ambulatory Visit: Payer: Self-pay | Admitting: Cardiology

## 2008-03-18 ENCOUNTER — Ambulatory Visit: Payer: Self-pay | Admitting: Cardiology

## 2008-04-08 ENCOUNTER — Ambulatory Visit: Payer: Self-pay | Admitting: Internal Medicine

## 2008-05-06 ENCOUNTER — Ambulatory Visit: Payer: Self-pay | Admitting: Cardiology

## 2008-05-10 ENCOUNTER — Ambulatory Visit: Payer: Self-pay | Admitting: Pulmonary Disease

## 2009-06-21 ENCOUNTER — Emergency Department (HOSPITAL_COMMUNITY): Admission: EM | Admit: 2009-06-21 | Discharge: 2009-06-21 | Payer: Self-pay | Admitting: Emergency Medicine

## 2009-06-21 IMAGING — CT CT ANGIO CHEST
3 of 12 series · 18 of 46 positions shown · IV contrast (APPLIED)
Comparison: [DATE].

CTA CHEST

CLINICAL DATA: Chest pain.  Abdominal pain.  Elevated D-dimer.

CT ANGIOGRAPHY CHEST
CT ABDOMEN AND PELVIS WITH CONTRAST
TECHNIQUE: Multidetector CT imaging of the chest was performed
using the standard protocol during bolus administration of
intravenous contrast. Multiplanar CT image reconstructions
including MIPs were obtained to evaluate the vascular anatomy.
Multidetector CT imaging of the abdomen and pelvis was performed
intravenous contrast.
Contrast: 100 ml [W0]

[Series 7: pulm embolism 1.0 thins · axial · 0.51mm/px · z∈[-432,-242]mm · 13 of 223 slices shown]
[im 16/223  lung]
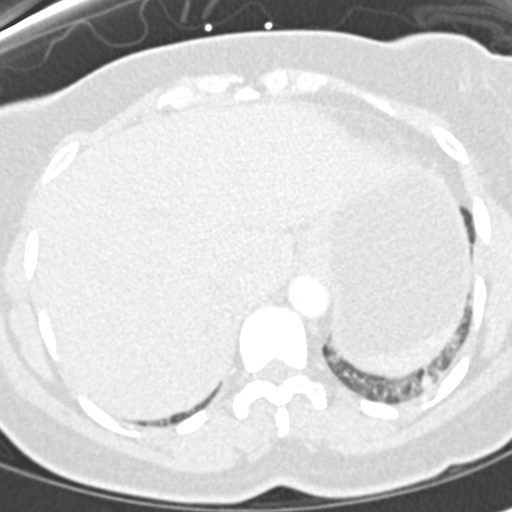
[im 32/223  soft-tissue]
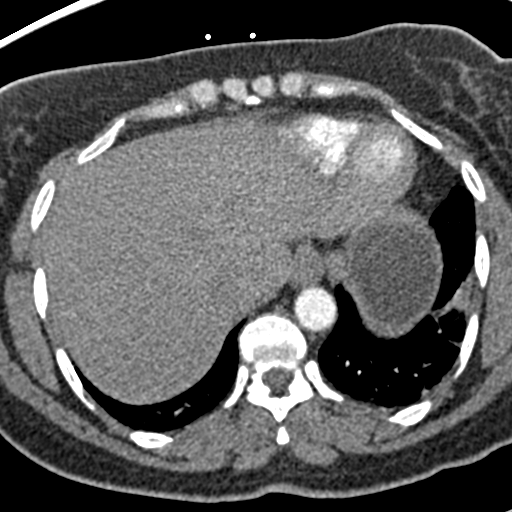
[im 48/223  lung]
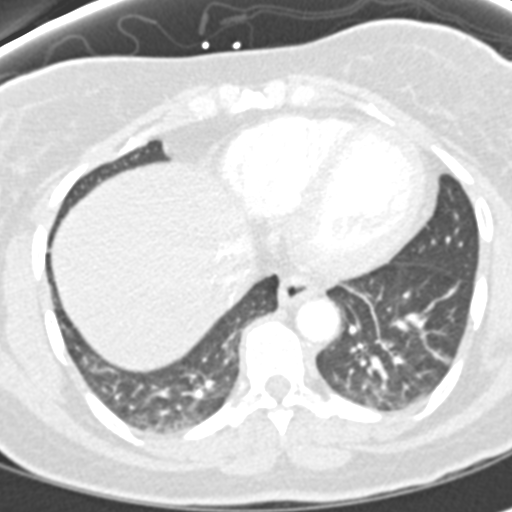
[im 64/223  soft-tissue]
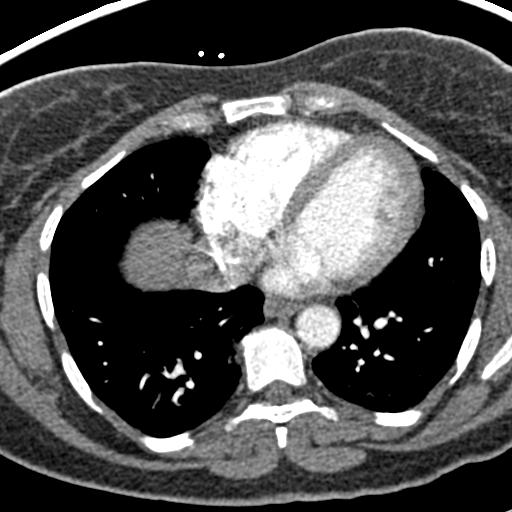
[im 80/223  lung]
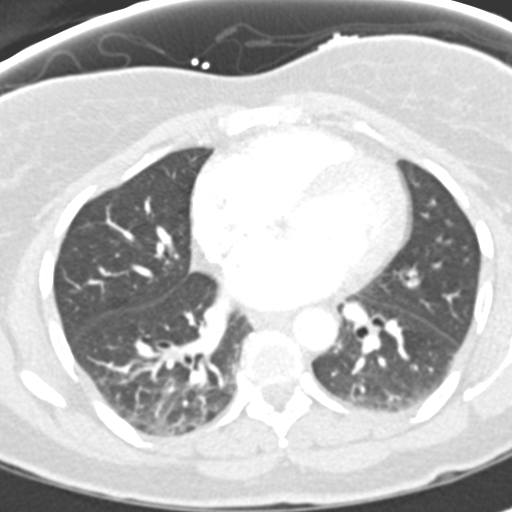
[im 96/223  soft-tissue]
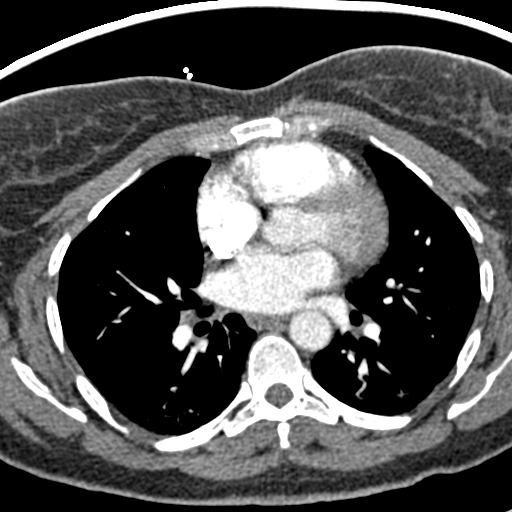
[im 112/223  lung]
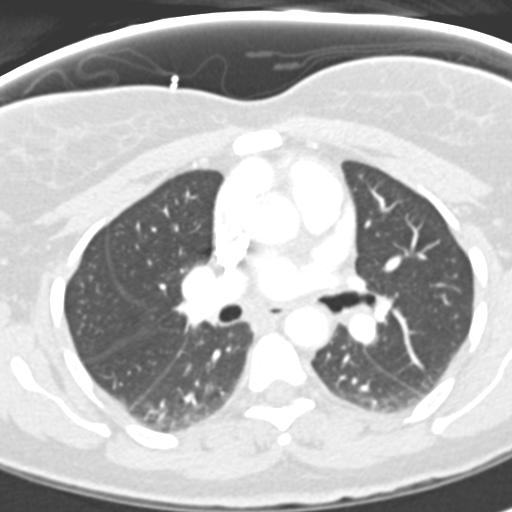
[im 127/223  soft-tissue]
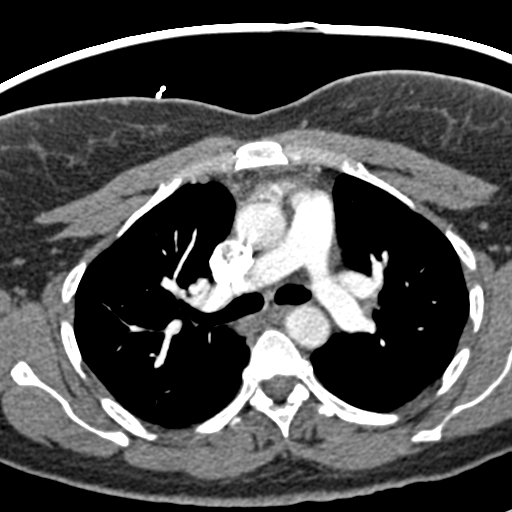
[im 143/223  lung]
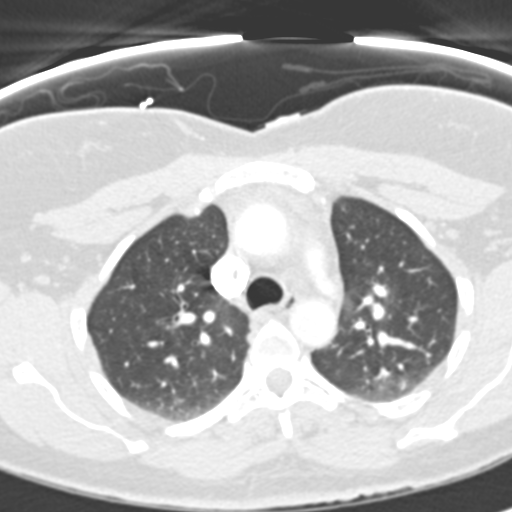
[im 159/223  soft-tissue]
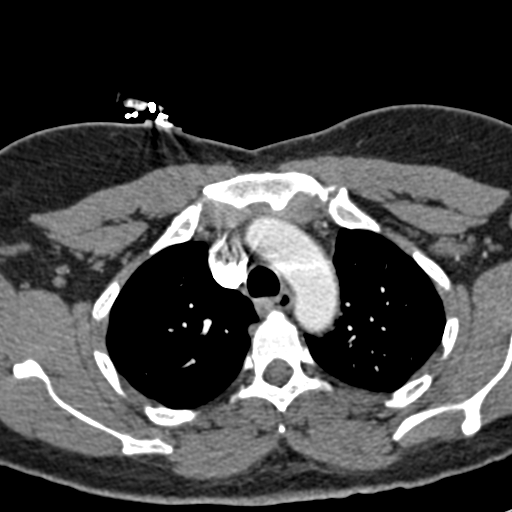
[im 175/223  lung]
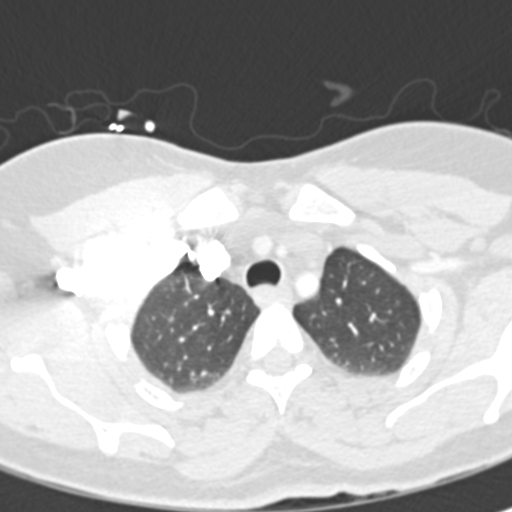
[im 191/223  soft-tissue]
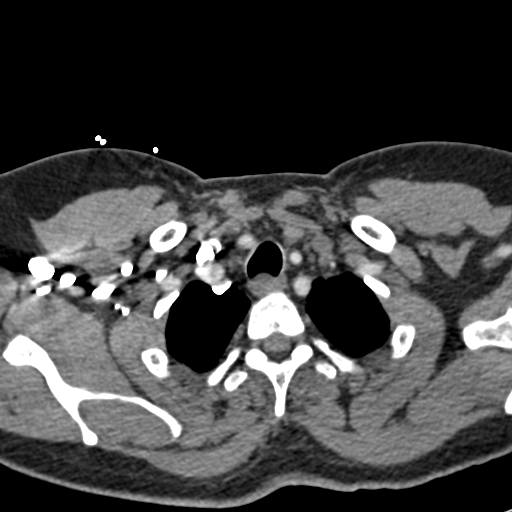
[im 207/223  lung]
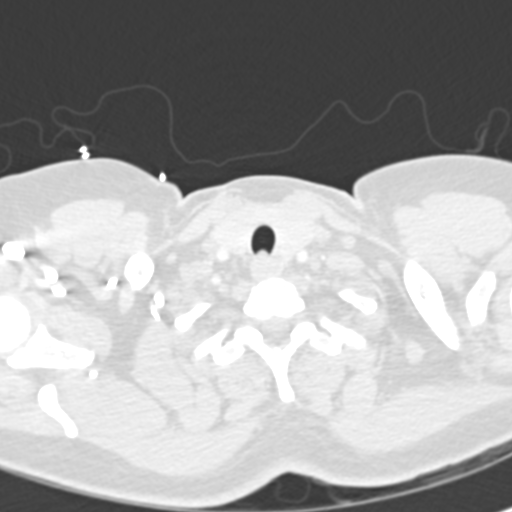

[Series 10: abd/pelv with 5.0 b31f st · axial · 0.62mm/px · z∈[-753,-528]mm · 4 of 92 slices shown]
[im 16/92  lung]
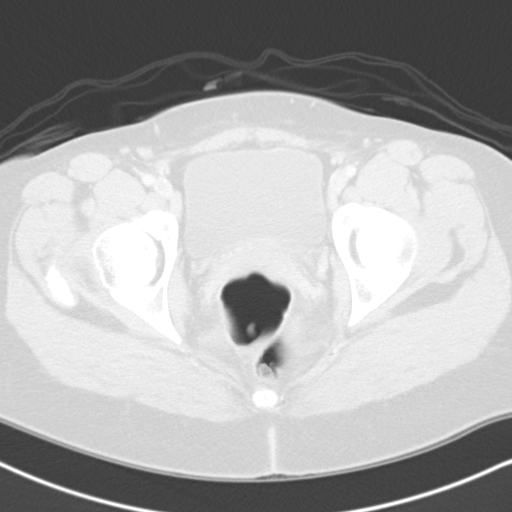
[im 31/92  lung]
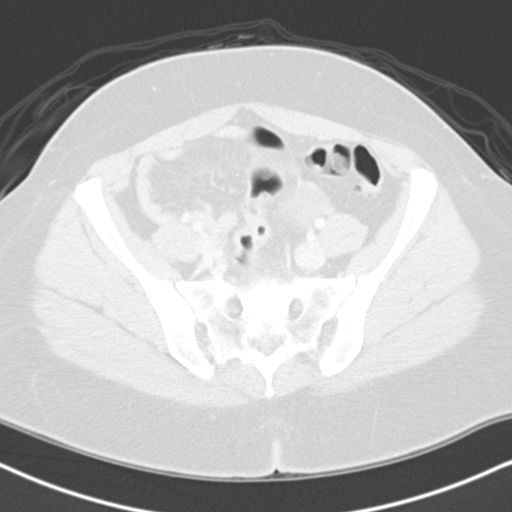
[im 46/92  lung]
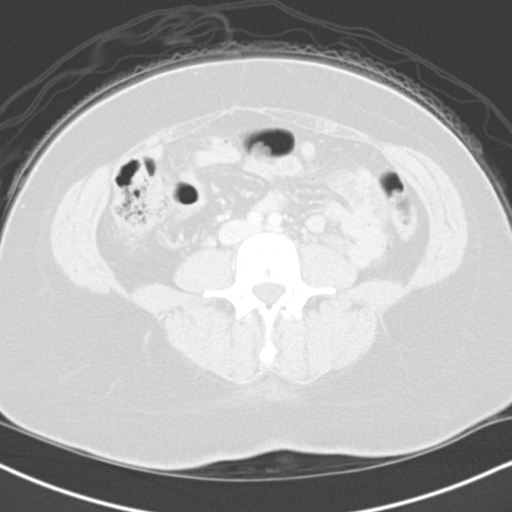
[im 61/92  lung]
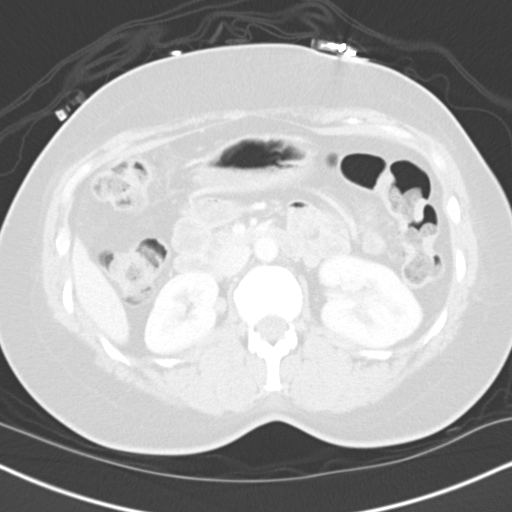

[Series 604: coronal mips · coronal · 0.51mm/px · 1 of 98 slices shown]
[im 49/98  soft-tissue]
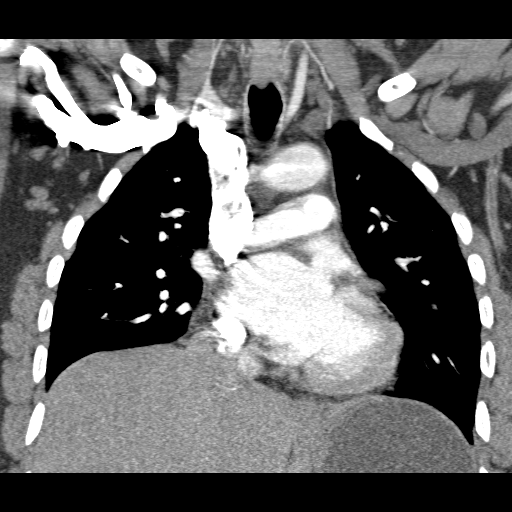

[18 of 46 positions shown; findings below may reference images not displayed]

FINDINGS: Pulmonary arterial opacification is excellent.  There
are no discernible pulmonary emboli.  There is mild atelectasis at
the lung bases, left more than right.  No pleural or pericardial
fluid.  No pulmonary mass.  No enlarged lymph nodes.

 Review of the MIP images confirms the above findings.
IMPRESSION: No discernible pulmonary emboli.

Mild atelectasis at the lung bases, left more than right.

CT ABDOMEN
FINDINGS: The liver has a normal appearance without focal lesions
or biliary ductal dilatation.  No calcified gallstones.  The spleen
is normal.  The pancreas is normal.  The adrenal glands are normal.
The kidneys are normal.  The aorta and IVC are normal.  No
retroperitoneal mass or adenopathy.  No free intraperitoneal fluid
or air.  No bowel pathology is seen.
IMPRESSION: Normal CT scan of the abdomen

CT PELVIS
FINDINGS: The appendix appears normal.  There is fluid filling the
cul-de-sac with a density measurement of simple fluid.  This does
not appear to be free fluid and probably represents a cystic
ovarian lesion.  This may arise from the left ovary.  There is been
previous hysterectomy.  Overall measurements are 12 cm front to
back, 6.5 cm cephalocaudal and 9.5 cm right to left.

No adenopathy.  No bowel pathology seen in the region.
IMPRESSION: Fluid density material filling the cul-de-sac.  I think is probably
represents a cystic ovarian lesion and could be benign or
malignant.  Overall measurements are 12 x 6.5 x 9.5 cm.

## 2009-06-21 IMAGING — CR DG CHEST 2V
2 series · 2 of 2 positions shown · non-contrast
Comparison: [DATE]

CLINICAL DATA: Chest pain

CHEST - 2 VIEW

[w chest pa]
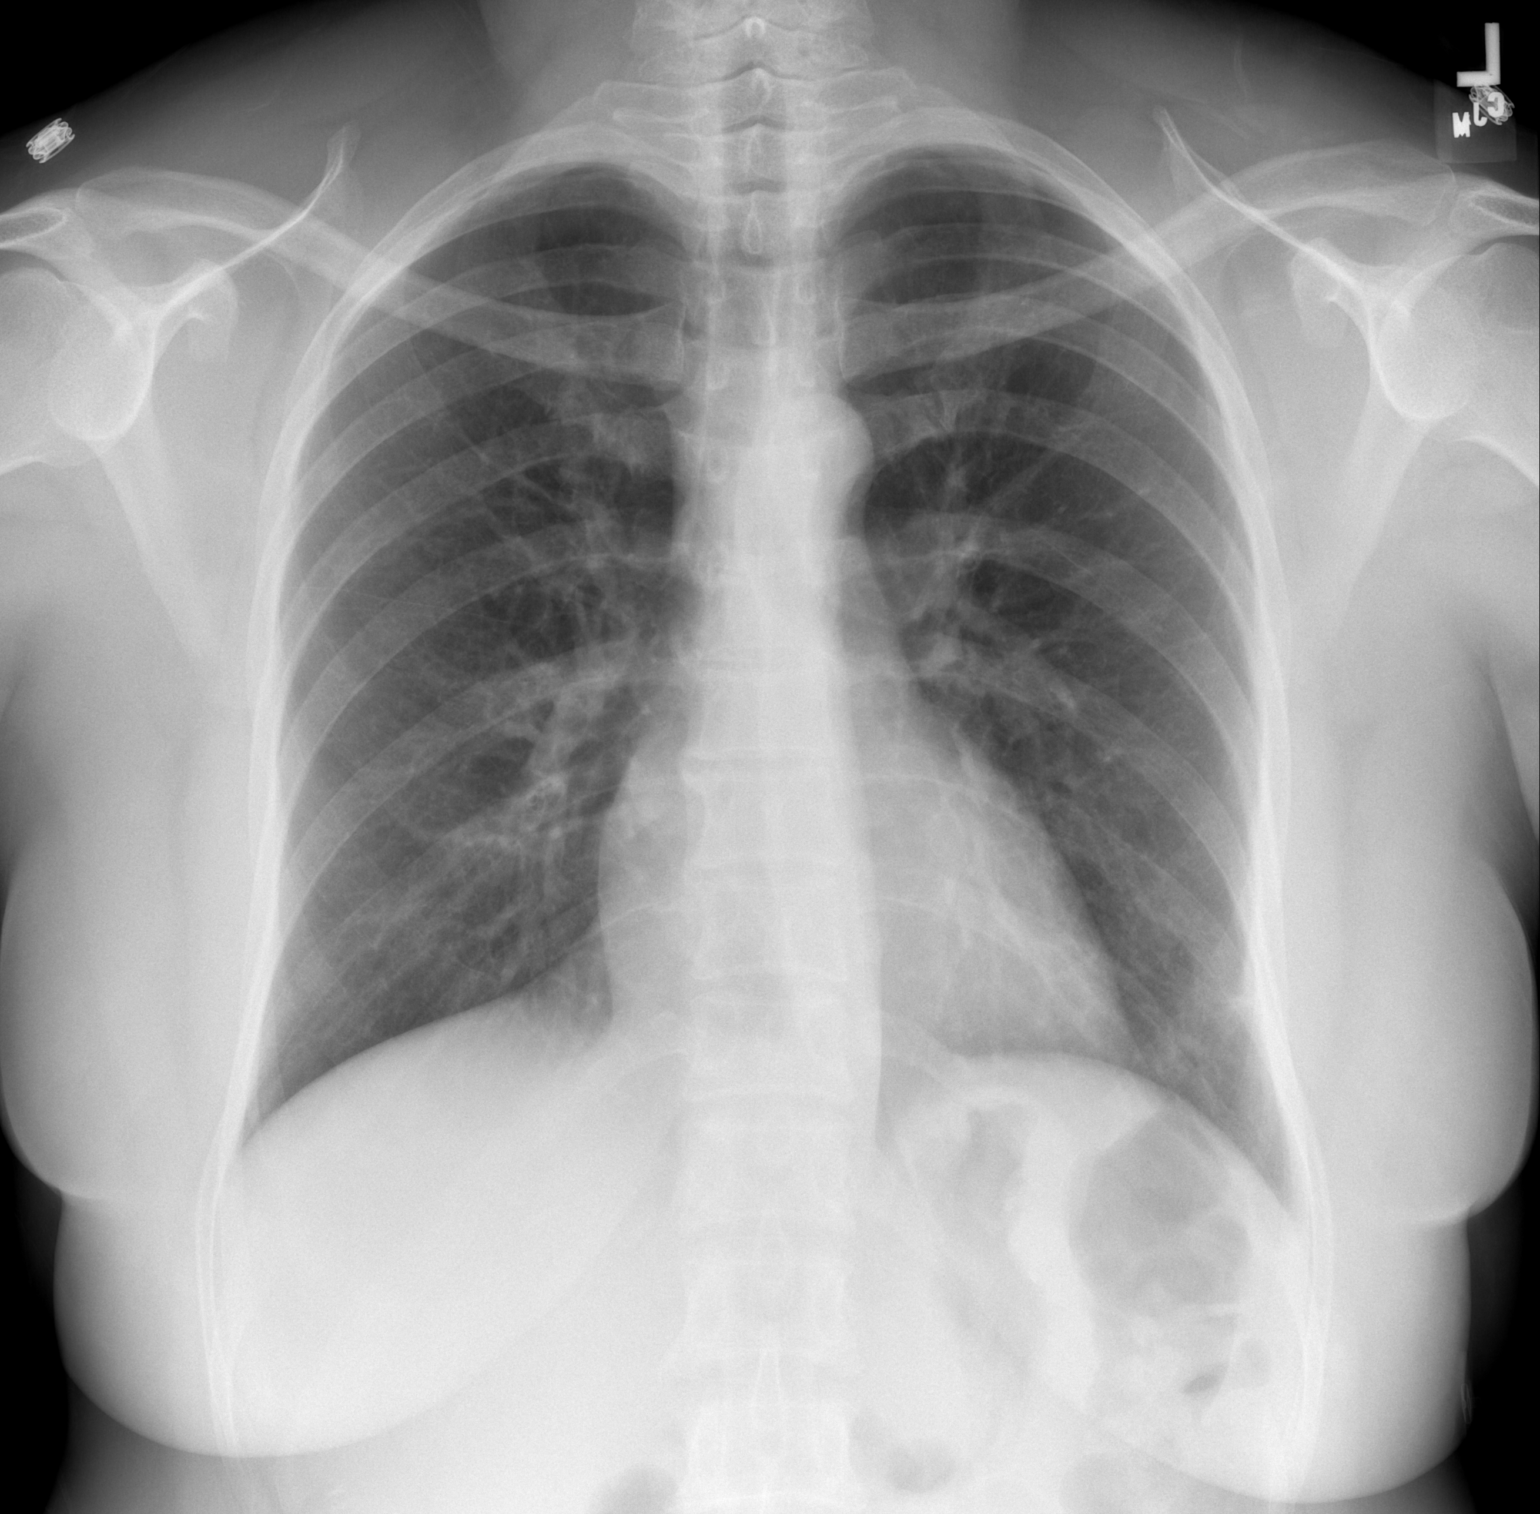

[w chest lat]
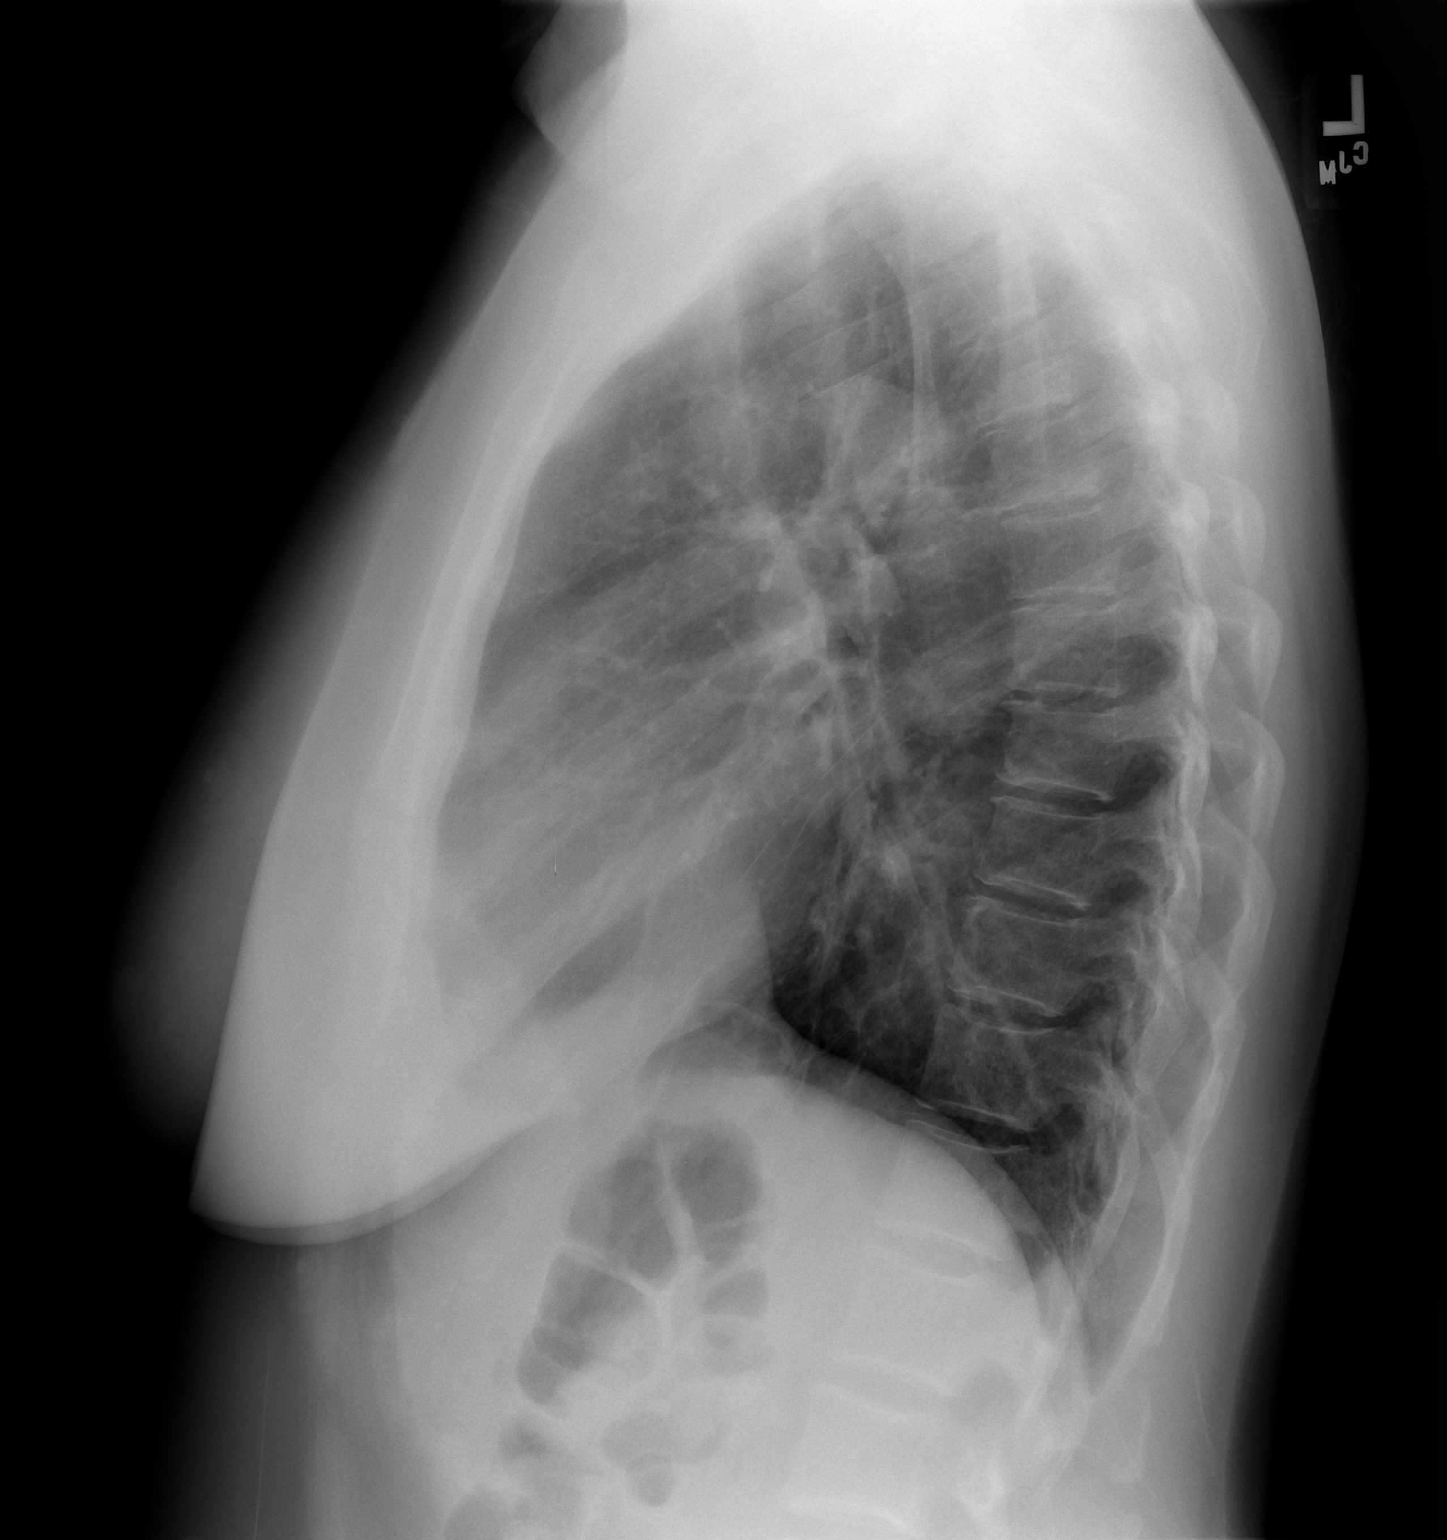

[2 of 2 positions shown; findings below may reference images not displayed]

FINDINGS: Heart size is normal.  The mediastinum is unremarkable.
Lungs are clear. There is mild scarring at the left base. No
effusions.  No soft tissue or bony abnormalities seen.
IMPRESSION: No active disease

## 2009-07-26 ENCOUNTER — Ambulatory Visit: Payer: Self-pay | Admitting: Pulmonary Disease

## 2009-08-04 ENCOUNTER — Ambulatory Visit (HOSPITAL_COMMUNITY): Admission: RE | Admit: 2009-08-04 | Discharge: 2009-08-04 | Payer: Self-pay | Admitting: Obstetrics and Gynecology

## 2009-09-27 ENCOUNTER — Ambulatory Visit: Payer: Self-pay | Admitting: Internal Medicine

## 2009-09-27 DIAGNOSIS — R7989 Other specified abnormal findings of blood chemistry: Secondary | ICD-10-CM | POA: Insufficient documentation

## 2009-09-27 DIAGNOSIS — G5600 Carpal tunnel syndrome, unspecified upper limb: Secondary | ICD-10-CM | POA: Insufficient documentation

## 2009-09-27 DIAGNOSIS — M26629 Arthralgia of temporomandibular joint, unspecified side: Secondary | ICD-10-CM | POA: Insufficient documentation

## 2009-09-27 LAB — CONVERTED CEMR LAB
BUN: 14 mg/dL (ref 6–23)
Basophils Absolute: 0.1 10*3/uL (ref 0.0–0.1)
Basophils Relative: 2.4 % (ref 0.0–3.0)
Chloride: 105 meq/L (ref 96–112)
Direct LDL: 132.6 mg/dL
Eosinophils Absolute: 0.1 10*3/uL (ref 0.0–0.7)
Eosinophils Relative: 1.6 % (ref 0.0–5.0)
HDL: 66.9 mg/dL (ref >39.00)
Hemoglobin: 13.2 g/dL (ref 12.0–15.0)
Lymphocytes Relative: 39.9 % (ref 12.0–46.0)
Lymphs Abs: 2.2 10*3/uL (ref 0.7–4.0)
MCHC: 32.6 g/dL (ref 30.0–36.0)
MCV: 87.1 fL (ref 78.0–100.0)
Monocytes Absolute: 0.6 10*3/uL (ref 0.1–1.0)
Monocytes Relative: 10.7 % (ref 3.0–12.0)
Neutro Abs: 2.5 10*3/uL (ref 1.4–7.7)
Neutrophils Relative %: 45.4 % (ref 43.0–77.0)
Platelets: 223 10*3/uL (ref 150.0–400.0)
RBC: 4.64 M/uL (ref 3.87–5.11)
RDW: 12.6 % (ref 11.5–14.6)
Total CHOL/HDL Ratio: 3
VLDL: 12.4 mg/dL (ref 0.0–40.0)
WBC: 5.5 10*3/uL (ref 4.5–10.5)

## 2010-07-07 ENCOUNTER — Ambulatory Visit: Payer: Self-pay | Admitting: Internal Medicine

## 2010-07-07 LAB — CONVERTED CEMR LAB
Basophils Absolute: 0 10*3/uL (ref 0.0–0.1)
Eosinophils Relative: 1.1 % (ref 0.0–5.0)
HCT: 38.8 % (ref 36.0–46.0)
Hemoglobin: 13.1 g/dL (ref 12.0–15.0)
INR: 1.1 — ABNORMAL HIGH (ref 0.8–1.0)
MCHC: 33.9 g/dL (ref 30.0–36.0)
MCV: 86.3 fL (ref 78.0–100.0)
Neutro Abs: 2.1 10*3/uL (ref 1.4–7.7)
Neutrophils Relative %: 48.6 % (ref 43.0–77.0)
Platelets: 215 10*3/uL (ref 150.0–400.0)
RBC: 4.49 M/uL (ref 3.87–5.11)
RDW: 13.4 % (ref 11.5–14.6)
WBC: 4.4 10*3/uL — ABNORMAL LOW (ref 4.5–10.5)

## 2010-07-09 ENCOUNTER — Encounter: Payer: Self-pay | Admitting: Internal Medicine

## 2010-08-10 ENCOUNTER — Ambulatory Visit: Payer: Self-pay | Admitting: Internal Medicine

## 2010-08-10 ENCOUNTER — Encounter (INDEPENDENT_AMBULATORY_CARE_PROVIDER_SITE_OTHER): Payer: Self-pay | Admitting: *Deleted

## 2010-08-10 DIAGNOSIS — J029 Acute pharyngitis, unspecified: Secondary | ICD-10-CM | POA: Insufficient documentation

## 2010-08-10 DIAGNOSIS — N390 Urinary tract infection, site not specified: Secondary | ICD-10-CM | POA: Insufficient documentation

## 2010-08-10 DIAGNOSIS — R109 Unspecified abdominal pain: Secondary | ICD-10-CM | POA: Insufficient documentation

## 2010-08-10 LAB — CONVERTED CEMR LAB
Glucose, Urine, Semiquant: NEGATIVE
Protein, U semiquant: NEGATIVE
Rapid Strep: NEGATIVE
Urobilinogen, UA: 0.2

## 2010-09-19 NOTE — Assessment & Plan Note (Signed)
Summary: bruise left thigh,hit on bed post/cd   Vital Signs:  Patient profile:   50 year old female Height:      63 inches Weight:      173 pounds BMI:     30.76 O2 Sat:      99 % on Room air Temp:     97.8 degrees F oral Pulse rate:   78 / minute BP sitting:   148 / 96  (left arm) Cuff size:   regular  Vitals Entered By: Bill Salinas CMA (July 07, 2010 9:37 AM)  O2 Flow:  Room air CC: pt here for evalution of bruise and knot on her left thigh after hitting it on her bed about 2 weeks ago/ ab   Primary Care Provider:  Norins  CC:  pt here for evalution of bruise and knot on her left thigh after hitting it on her bed about 2 weeks ago/ ab.  History of Present Illness: Patient presents due to a persistent bruise on the left thigh. She ran into the corner of the bed 2 weeks ago and this bruise seems slow to heal. She denies any other bleeding problems: no bleeding gums or other bruising. She is on no anticoagulants. She otherwise feels fine  She was diagnosed by dermatology with lupus of the skin and is treated with a steroid cream.  Current Medications (verified): 1)  None  Allergies (verified): No Known Drug Allergies  Past History:  Past Medical History: Last updated: 12/23/2007 PULMONARY EMBOLISM, HX OF (ICD-V12.51)  March '09 Childhood asthma chicken pox as a child    Past Surgical History: Last updated: 09/27/2009 Hysterectomy-secondary to fibroids with oopherectomy  G3P2  TAB 1 FH reviewed for relevance, SH/Risk Factors reviewed for relevance  Review of Systems  The patient denies anorexia, fever, weight loss, chest pain, headaches, abdominal pain, hematuria, muscle weakness, difficulty walking, and abnormal bleeding.    Physical Exam  General:  Well-developed,well-nourished,in no acute distress; alert,appropriate and cooperative throughout examination Head:  normocephalic and atraumatic.   Eyes:  C&S clear Lungs:  normal respiratory effort and  normal breath sounds.   Heart:  normal rate and regular rhythm.   Skin:  4 x 4 cm bruise left dorsal thigh with a firm 1cm nodule in the center. No other lesions noted.   Impression & Recommendations:  Problem # 1:  CONTUSION, LEFT THIGH (ICD-924.00) simple bruise. No evidence of bleeding disorder.  Plan - CBC and coags           reassurance.  addendum - Patient: Jasmine Watson Note: All result statuses are Final unless otherwise noted.  Tests: (1) CBC Platelet w/Diff (CBCD)   White Cell Count     [L]  4.4 K/uL                    4.5-10.5   Red Cell Count            4.49 Mil/uL                 3.87-5.11   Hemoglobin                13.1 g/dL                   16.1-09.6   Hematocrit                38.8 %  36.0-46.0   MCV                       86.3 fl                     78.0-100.0   MCHC                      33.9 g/dL                   16.1-09.6   RDW                       13.4 %                      11.5-14.6   Platelet Count            215.0 K/uL                  150.0-400.0   Neutrophil %              48.6 %                      43.0-77.0   Lymphocyte %              40.4 %                      12.0-46.0   Monocyte %                9.6 %                       3.0-12.0   Eosinophils%              1.1 %                       0.0-5.0   Basophils %               0.3 %                       0.0-3.0   Neutrophill Absolute      2.1 K/uL                    1.4-7.7   Lymphocyte Absolute       1.8 K/uL                    0.7-4.0   Monocyte Absolute         0.4 K/uL                    0.1-1.0  Eosinophils, Absolute                             0.0 K/uL                    0.0-0.7   Basophils Absolute        0.0 K/uL                    0.0-0.1  Tests: (2) PT (PTP)   Prothrombin Time          11.3 sec  9.7-11.8   INR                  [H]  1.1 ratio                   0.8-1.0  Tests: (3) PTT (PTTL)   Appt Time                 27.9 SEC                     21.7-28.8  Other Orders: TLB-CBC Platelet - w/Differential (85025-CBCD) TLB-PT (Protime) (85610-PTP) TLB-PTT (85730-PTTL)   Orders Added: 1)  TLB-CBC Platelet - w/Differential [85025-CBCD] 2)  TLB-PT (Protime) [85610-PTP] 3)  TLB-PTT [85730-PTTL] 4)  Est. Patient Level III [16109]

## 2010-09-19 NOTE — Assessment & Plan Note (Signed)
Summary: f/u appt/#/cd   Vital Signs:  Patient profile:   50 year old female Height:      63 inches Weight:      172 pounds BMI:     30.58 O2 Sat:      100 % on Room air Temp:     98.0 degrees F oral Pulse rate:   83 / minute BP sitting:   124 / 88  (left arm) Cuff size:   regular  Vitals Entered By: Bill Salinas CMA (September 27, 2009 3:57 PM)  O2 Flow:  Room air CC: pt here for follow up office visit, she complains of dizziness off and on x 2 weeks with no pattern/ pt is sch for mammogram this month and recently had a hysterectomy/ab   Primary Care Provider:  Raylin Diguglielmo  CC:  pt here for follow up office visit and she complains of dizziness off and on x 2 weeks with no pattern/ pt is sch for mammogram this month and recently had a hysterectomy/ab.  History of Present Illness: Patient presents for general medical exam. She has just seen her gynecologist for a normal exam.  She has seen Blima Ledger, OD for pain in the left eye. She is using drops. The pain is worse with light. Also a question of cholesterol plagues on fundiscopic exam.   She has been having pain in the right foot: it will feel cold and numb and hurt up to the knee. No trouble with walking or going up and down steps. She will have some pain at the heel with weight bearing.   Her right hand will also hurt, worse at night. She will have paresthesia in the hand and shehas to shake it out in the morning.   She will have pain across her left chest if she is in the wrong position. It is an aching pain.   Current Medications (verified): 1)  None  Allergies (verified): No Known Drug Allergies  Past History:  Past Medical History: Last updated: 12/23/2007 PULMONARY EMBOLISM, HX OF (ICD-V12.51)  March '09 Childhood asthma chicken pox as a child    Family History: Last updated: 12/23/2007 father - '38: prostate cancer (survivor), HTN, DM mother - '42:  brother - renal cell carcinoma Neg- Breast  or colon  Cancer; CAD (2nd degree family GM with CAD)  Social History: Last updated: 12/23/2007 HSG, 1 semester of college married '92 - 1 son - '93, 1 daughter - '98 work: Nursing ass't [employer]  Past Surgical History: Hysterectomy-secondary to fibroids with oopherectomy  G3P2  TAB 1  Review of Systems       The patient complains of decreased hearing, headaches, severe indigestion/heartburn, and suspicious skin lesions.  The patient denies anorexia, fever, weight loss, weight gain, syncope, dyspnea on exertion, peripheral edema, prolonged cough, abdominal pain, hematochezia, hematuria, incontinence, difficulty walking, abnormal bleeding, and angioedema.         Night sweats. Vaginal dryness and dysparunia. Having headaches twice a week -duration of 1 hour. Have been severe and limiting activity; no N/V. Has a darkening of the skin on the face just below the orbit.   Physical Exam  General:  WNWD AA female in no distress Head:  Normocephalic and atraumatic without obvious abnormalities. No apparent alopecia or balding. Eyes:  No corneal or conjunctival inflammation noted. EOMI. Perrla. Funduscopic exam benign, without hemorrhages, exudates or papilledema. Vision grossly normal. Ears:  R ear normal, L ear normal, and no external deformities.   Nose:  no external deformity and no external erythema.   Mouth:  Oral mucosa and oropharynx without lesions or exudates.  Teeth in good repair. Neck:  supple, full ROM, no thyromegaly, and no carotid bruits.   Chest Wall:  no deformities.   Breasts:  deferred Lungs:  Normal respiratory effort, chest expands symmetrically. Lungs are clear to auscultation, no crackles or wheezes. Heart:  Normal rate and regular rhythm. S1 and S2 normal without gallop, murmur, click, rub or other extra sounds. Abdomen:  soft, normal bowel sounds, no distention, no masses, and no hepatomegaly.  Tender to palpation in the epigastrum Genitalia:  deferred Msk:  normal ROM,  no joint tenderness, no joint swelling, no joint warmth, and no joint deformities.  Tender to palpation over both TMJ's Pulses:  2+ radial 2+ DP and PT bilaterally Extremities:  No clubbing, cyanosis, edema, or deformity noted with normal full range of motion of all joints.   Neurologic:  alert & oriented X3, cranial nerves II-XII intact, strength normal in all extremities, gait normal, and DTRs symmetrical and normal.  Positive Tinel's and Phalen's sign. Skin:  turgor normal, color normal, no rashes, no suspicious lesions, no ulcerations, and no edema.   Cervical Nodes:  no anterior cervical adenopathy and no posterior cervical adenopathy.   Psych:  Oriented X3, memory intact for recent and remote, normally interactive, and good eye contact.     Impression & Recommendations:  Problem # 1:  TEMPOROMANDIBULAR JOINT PAIN (ICD-524.62) Patient's facial pain is c/w TMJ pain/tenderness  Plan - avoid chewing gum, food should be small bites          check with dentist about possible malocclusion  Problem # 2:  CARPAL TUNNEL SYNDROME, BILATERAL (ICD-354.0) Postive exam for carpal tunnel  Plan - cock-up wrist splint for night-time use.   Problem # 3:  PARESTHESIA (ICD-782.0) Exam reveals normal sensation to deep vibration and normal circulation.  Plan - r/o metabolic cause, e.g. B12 deficiency.   Orders: TLB-B12 + Folate Pnl ([id number]-B12/FOL)  Addendum - B12 normal   Problem # 4:  Preventive Health Care (ICD-V70.0) Current with Gyn. she is seeing optometry in regard to painful eye. Her exam is grossly normal. labs are fine with LDL 132.6.  In summary - patient with minor discomforts with carpal tunnel and TMJ. Her chest wall pain appears to be inflammatory and may be relieved with local heat or lineament. She is otherwise doing well. she will return as needed.   Other Orders: TLB-Lipid Panel (80061-LIPID) TLB-CBC Platelet - w/Differential (85025-CBCD) TLB-BMP (Basic Metabolic  Panel-BMET) (80048-METABOL) TLB-TSH (Thyroid Stimulating Hormone) (16109-UEA)  Patient: Jasmine Watson Note: All result statuses are Final unless otherwise noted.  Tests: (1) Lipid Panel (LIPID)   Cholesterol          [H]  209 mg/dL                   5-409     ATP III Classification            Desirable:  < 200 mg/dL                    Borderline High:  200 - 239 mg/dL               High:  > = 240 mg/dL   Triglycerides             62.0 mg/dL  0.0-149.0     Normal:  <150 mg/dL     Borderline High:  161 - 199 mg/dL   HDL                       09.60 mg/dL                 >45.40   VLDL Cholesterol          12.4 mg/dL                  9.8-11.9  CHO/HDL Ratio:  CHD Risk                             3                    Men          Women     1/2 Average Risk     3.4          3.3     Average Risk          5.0          4.4     2X Average Risk          9.6          7.1     3X Average Risk          15.0          11.0                           Tests: (2) CBC Platelet w/Diff (CBCD)   White Cell Count          5.5 K/uL                    4.5-10.5   Red Cell Count            4.64 Mil/uL                 3.87-5.11   Hemoglobin                13.2 g/dL                   14.7-82.9   Hematocrit                40.4 %                      36.0-46.0   MCV                       87.1 fl                     78.0-100.0   MCHC                      32.6 g/dL                   56.2-13.0   RDW                       12.6 %                      11.5-14.6   Platelet Count  223.0 K/uL                  150.0-400.0   Neutrophil %              45.4 %                      43.0-77.0   Lymphocyte %              39.9 %                      12.0-46.0   Monocyte %                10.7 %                      3.0-12.0   Eosinophils%              1.6 %                       0.0-5.0   Basophils %               2.4 %                       0.0-3.0   Neutrophill Absolute      2.5 K/uL                     1.4-7.7   Lymphocyte Absolute       2.2 K/uL                    0.7-4.0   Monocyte Absolute         0.6 K/uL                    0.1-1.0  Eosinophils, Absolute                             0.1 K/uL                    0.0-0.7   Basophils Absolute        0.1 K/uL                    0.0-0.1  Tests: (3) BMP (METABOL)   Sodium                    140 mEq/L                   135-145   Potassium                 3.8 mEq/L                   3.5-5.1   Chloride                  105 mEq/L                   96-112   Carbon Dioxide            29 mEq/L                    19-32   Glucose  88 mg/dL                    16-10   BUN                       14 mg/dL                    9-60   Creatinine                0.8 mg/dL                   4.5-4.0   Calcium                   9.4 mg/dL                   9.8-11.9   GFR                       98.33 mL/min                >60  Tests: (4) TSH (TSH)   FastTSH                   1.01 uIU/mL                 0.35-5.50  Tests: (5) B12 + Folate Panel (B12/FOL)   Vitamin B12               519 pg/mL                   211-911   Folate                    13.4 ng/mL     Deficient  0.4 - 3.4 ng/mL     Indeterminate  3.4 - 5.4 ng/mL     Normal  >5.4 ng/mL  Tests: (6) Cholesterol LDL - Direct (DIRLDL)  Cholesterol LDL - Direct                             132.6 mg/dL     Optimal:  <147 mg/dL     Near or Above Optimal:  100-129 mg/dL     Borderline High:  829-562 mg/dL     High:  130-865 mg/dL     Very High:  >784 mg/dL

## 2010-09-19 NOTE — Letter (Signed)
[facility] [address] [place], [place]  [address] Phone: [phone]      July 10, 2010   [place] [address] [place], [place] [address]  RE:  LAB RESULTS  Dear  Ms. DYMEK,  The following is an interpretation of your most recent lab tests.  Please take note of any instructions provided or changes to medications that have resulted from your lab work.    CBC:  Good - no changes needed Coag labs normal   No evidence of any bleeding disorder. The contusion on your thigh will heal with time.   Please come see me if you have any questions about these lab results.   Sincerely Yours,    Jacques Navy MD  Patient: Jasmine Watson Note: All result statuses are Final unless otherwise noted.  Tests: (1) CBC Platelet w/Diff (CBCD)   White Cell Count     [L]  4.4 K/uL                    4.5-10.5   Red Cell Count            4.49 Mil/uL                 3.87-5.11   Hemoglobin                13.1 g/dL                   58.5-27.7   Hematocrit                38.8 %                      36.0-46.0   MCV                       86.3 fl                     78.0-100.0   MCHC                      33.9 g/dL                   82.4-23.5   RDW                       13.4 %                      11.5-14.6   Platelet Count            215.0 K/uL                  150.0-400.0   Neutrophil %              48.6 %                      43.0-77.0   Lymphocyte %              40.4 %                      12.0-46.0   Monocyte %                9.6 %  3.0-12.0   Eosinophils%              1.1 %                       0.0-5.0   Basophils %               0.3 %                       0.0-3.0   Neutrophill Absolute      2.1 K/uL                    1.4-7.7   Lymphocyte Absolute       1.8 K/uL                    0.7-4.0   Monocyte Absolute         0.4 K/uL                    0.1-1.0  Eosinophils, Absolute                             0.0 K/uL                     0.0-0.7   Basophils Absolute        0.0 K/uL                    0.0-0.1  Tests: (2) PT (PTP)   Prothrombin Time          11.3 sec                    9.7-11.8   INR                  [H]  1.1 ratio                   0.8-1.0  Tests: (3) PTT (PTTL)   Appt Time                 27.9 SEC                    21.7-28.8

## 2010-09-21 NOTE — Assessment & Plan Note (Signed)
Summary: cough/cold/men/cd   Vital Signs:  Patient profile:   50 year old female Height:      63 inches (160.02 cm) Weight:      173 pounds (78.64 kg) O2 Sat:      98 % on Room air Temp:     99.3 degrees F (37.39 degrees C) oral Pulse rate:   101 / minute BP sitting:   120 / 72  (left arm) Cuff size:   regular  Vitals Entered By: Orlan Leavens RMA (August 10, 2010 10:34 AM)  O2 Flow:  Room air CC: Cough& sore throat x's 4-5 days Is Patient Diabetic? No Pain Assessment Patient in pain? yes     Location: abdomen Type: aching Onset of pain  Pt also c/o abdominal pain started yesterday   Primary Care Provider:  Norins  CC:  Cough& sore throat x's 4-5 days.  History of Present Illness: here with abd pain - started as head cold symptoms - cough (nonproducitve) and ST onset 5 days ago - no HA, no fever or chills now c/o new suprapubic pain in past 24h - (primary concern) pain 8/10 -wax/wane in "waves" not worse or improved with movement or position but pain exac by cough spells no change in bowels - 2 normal BM yest, none today - no d/c c/o nausea 3 days ago but none now, no vomitting - tol food/drink/meds w/o probs no abd cramping, no travel -  no hx similar abd pain, no flank pain, no CP or back pain +dysuria, but no hematuria -  Clinical Review Panels:  CBC   WBC:  4.4 (07/07/2010)   RBC:  4.49 (07/07/2010)   Hgb:  13.1 (07/07/2010)   Hct:  38.8 (07/07/2010)   Platelets:  215.0 (07/07/2010)   MCV  86.3 (07/07/2010)   MCHC  33.9 (07/07/2010)   RDW  13.4 (07/07/2010)   PMN:  48.6 (07/07/2010)   Lymphs:  40.4 (07/07/2010)   Monos:  9.6 (07/07/2010)   Eosinophils:  1.1 (07/07/2010)   Basophil:  0.3 (07/07/2010)  Complete Metabolic Panel   Glucose:  88 (09/27/2009)   Sodium:  140 (09/27/2009)   Potassium:  3.8 (09/27/2009)   Chloride:  105 (09/27/2009)   CO2:  29 (09/27/2009)   BUN:  14 (09/27/2009)   Creatinine:  0.8 (09/27/2009)   Calcium:  9.4  (09/27/2009)   Current Medications (verified): 1)  None  Allergies (verified): No Known Drug Allergies  Past History:  Past Medical History: PULMONARY EMBOLISM, HX   March '09 Childhood asthma chicken pox as a child    Review of Systems  The patient denies weight loss, vision loss, decreased hearing, peripheral edema, and abdominal pain.         (clarification - no abd pain before yesterday AM)  Physical Exam  General:  Well-developed,well-nourished, no acute distress but uncomfortable - AA Ox4 - spouse at side Eyes:  vision grossly intact; pupils equal, round and reactive to light.  conjunctiva and lids normal.    Ears:  normal pinnae bilaterally, without erythema, swelling, or tenderness to palpation. TMs clear, without effusion, or cerumen impaction. Hearing grossly normal bilaterally  Mouth:  teeth and gums in good repair; mucous membranes moist, without lesions or ulcers. oropharynx clear without exudate, no erythema.  Lungs:  normal respiratory effort, no intercostal retractions or use of accessory muscles; normal breath sounds bilaterally - no crackles and no wheezes.    Heart:  normal rate, regular rhythm, no murmur, and no rub. BLE without  edema. Abdomen:  soft but tender over suprapubic region to light palp - no r/g - normal BS - no flank pain Neurologic:  alert & oriented X3 and cranial nerves II-XII symetrically intact.  strength normal in all extremities, sensation intact to light touch, and gait normal. speech fluent without dysarthria or aphasia; follows commands with good comprehension.    Impression & Recommendations:  Problem # 1:  SUPRAPUBIC PAIN (ICD-789.09)  mild +Udip - LGF and pain on exam to suprapub palp- suspect cystitis vs colitis (though no change bowels) tol by mouth w/o n/v - declines rx for phenergan tx emperic cipro for GU/GI infx also symptoms relief of viral syndrome (ST, dry cough) with tylenol and cough suppression - see next to call if  symptoms worse Orders: Ketorolac-Toradol 15mg  (E4540) Admin of Therapeutic Inj  intramuscular or subcutaneous (98119)  Discussed use of medications, application of heat or cold, and food/hydration  Problem # 2:  UTI (ICD-599.0) dysuria and suprapubic discomfort - see above - cipro Her updated medication list for this problem includes:    Ciprofloxacin Hcl 500 Mg Tabs (Ciprofloxacin hcl) .Marland Kitchen... 1 by mouth two times a day x 7 days  Orders: UA Dipstick w/o Micro (manual) (14782) Prescription Created Electronically [phone])  Problem # 3:  PHARYNGITIS (ICD-462) suspect viral syndrome - ENT exam ok -  tylenol as needed - cough suppression to help control abd pain cramps - rx done Her updated medication list for this problem includes:    Ciprofloxacin Hcl 500 Mg Tabs (Ciprofloxacin hcl) .Marland Kitchen... 1 by mouth two times a day x 7 days  Orders: Rapid Strep (30865)  Complete Medication List: 1)  Ciprofloxacin Hcl 500 Mg Tabs (Ciprofloxacin hcl) .Marland Kitchen.. 1 by mouth two times a day x 7 days 2)  Hydromet 5-1.5 Mg/74ml Syrp (Hydrocodone-homatropine) .... 5 cc by mouth every 6 hours as needed for cough/pain symptoms  Patient Instructions: 1)  it was good to see you today. 2)  Cipro for your bladder infection and abdominal pain - also hydrocodone syrup for cough and pain -  3)  your prescriptions have been electronically submitted/faxed to your pharmacy. Please take as directed. Contact our office if you believe you're having problems with the medication(s).  4)  Tordol shot for pain given in office today 5)  work note provided - no lifitng x 72h, call if unable to go into work due to pain or fever for different note 6)  Get plenty of rest, drink lots of clear liquids, and use Tylenol or Ibuprofen for fever and comfort. Return in 7-10 days if you're not better:sooner if you're feeling worse. Prescriptions: HYDROMET 5-1.5 MG/5ML SYRP (HYDROCODONE-HOMATROPINE) 5 cc by mouth every 6 hours as needed for  cough/pain symptoms  #8 oz x 0   Entered and Authorized by:   Newt Lukes MD   Signed by:   Newt Lukes MD on 08/10/2010   Method used:   Printed then faxed to ...       [facility] [address] [phone]* (retail)       [address]       [place], [place]  [address]       Ph: [phone] or [phone]       Fax: [phone]   RxID:   [id number] CIPROFLOXACIN HCL 500 MG TABS (CIPROFLOXACIN HCL) 1 by mouth two times a day x 7 days  #14 x 0   Entered and Authorized by:  Newt Lukes MD   Signed by:   Newt Lukes MD on 08/10/2010   Method used:   Electronically to        [facility] [address] [phone]* (retail)       [address]       [place], [place]  [address]       Ph: [phone] or [phone]       Fax: [phone]   RxID:   [id number]    Medication Administration  Injection # 1:    Medication: Ketorolac-Toradol 15mg     Diagnosis: SUPRAPUBIC PAIN (ICD-789.09)    Route: IM    Site: LUOQ gluteus    Exp Date: 09/20/2010    Lot #: [id number]    Mfr: NOVALPLUS    Comments: Gave total of 60mg     Patient tolerated injection without complications    Given by: Orlan Leavens RMA (August 10, 2010 11:14 AM)  Orders Added: 1)  Rapid Strep [56387] 2)  UA Dipstick w/o Micro (manual) [81002] 3)  Ketorolac-Toradol 15mg  [J1885] 4)  Admin of Therapeutic Inj  intramuscular or subcutaneous [96[address]] 5)  Est. Patient Level IV [56433] 6)  Prescription Created Electronically [phone]    Laboratory Results   Urine Tests    Routine Urinalysis   Color: yellow Appearance: Clear Glucose: negative   (Normal Range: Negative) Bilirubin: negative   (Normal Range: Negative) Ketone: negative   (Normal Range: Negative) Spec. Gravity: <1.005   (Normal Range: 1.003-1.035) Blood: trace-intact   (Normal Range: Negative) pH: 5.0   (Normal Range: 5.0-8.0) Protein: negative   (Normal Range: Negative) Urobilinogen: 0.2    (Normal Range: 0-1) Nitrite: negative   (Normal Range: Negative) Leukocyte Esterace: small   (Normal Range: Negative)      Other Tests  Rapid Strep: negative

## 2010-09-21 NOTE — Letter (Signed)
Summary: [facility]  [address] [place], [place] [address]   Phone: [phone]  Fax: [phone]    Today's Date: August 10, 2010  Name of Patient: Jasmine Watson  The above named patient had a medical visit today 08/10/10 Please take this into consideration when reviewing the time away from work  Special Instructions:  [  ] No Lifting  [  ] To be off the remainder of today, returning to the normal work 08/11/10    Sincerely yours,   Dr. Rene Paci

## 2010-11-02 ENCOUNTER — Ambulatory Visit (INDEPENDENT_AMBULATORY_CARE_PROVIDER_SITE_OTHER): Payer: PRIVATE HEALTH INSURANCE | Admitting: Internal Medicine

## 2010-11-03 ENCOUNTER — Encounter: Payer: Self-pay | Admitting: Internal Medicine

## 2010-11-03 ENCOUNTER — Ambulatory Visit (INDEPENDENT_AMBULATORY_CARE_PROVIDER_SITE_OTHER): Payer: PRIVATE HEALTH INSURANCE | Admitting: Internal Medicine

## 2010-11-03 DIAGNOSIS — M25519 Pain in unspecified shoulder: Secondary | ICD-10-CM | POA: Insufficient documentation

## 2010-11-03 DIAGNOSIS — M549 Dorsalgia, unspecified: Secondary | ICD-10-CM | POA: Insufficient documentation

## 2010-11-03 DIAGNOSIS — M542 Cervicalgia: Secondary | ICD-10-CM

## 2010-11-03 DIAGNOSIS — M7710 Lateral epicondylitis, unspecified elbow: Secondary | ICD-10-CM

## 2010-11-07 NOTE — Letter (Signed)
Summary: Generic Letter  [facility]  [address] [place], [place] [address]   Phone: [phone]  Fax: [phone]    11/03/2010  Jasmine Watson [address] [place], [place]  [address]  [place]  Dear [name],       You should be restricted at work to no lifiting  patients or other that is more than 20 lbs until   November 19, 2010 due to your medical condition.          Sincerely,   Oliver Barre MD

## 2010-11-07 NOTE — Assessment & Plan Note (Signed)
Summary: right arm pain/#/norins/cd   Vital Signs:  Patient profile:   50 year old female Height:      64 inches Weight:      175.13 pounds BMI:     30.17 O2 Sat:      98 % on 0.25 L/min Temp:     98.4 degrees F oral Pulse rate:   75 / minute BP sitting:   110 / 80  (left arm) Cuff size:   regular  Vitals Entered By: Zella Ball Ewing CMA (AAMA) (November 03, 2010 3:15 PM)  O2 Flow:  0.25 L/min CC: Right Arm pain/RE   Primary Care Provider:  Norins  CC:  Right Arm pain/RE.  History of Present Illness: here with mult concerns of pain; c/o mod to severe gradually worsening right elbow pain worse over the past few wks and just too much to ignore now;  also with pain milder to the right ant shoulder without sweling, falls or injury;  also with discrete pain to the lower midline cervical area with mild right trapezoid area tenderness as well but no clear radicular symptoms or RUE weakness or numbness, bowel or bladder change, fever, wt loss, injury , fall, or gait difficulty.  Shoulder only hurts to lift the arm, arm throbs all the time now, worse to pick up heavier objects or turn pateints at work,  nothing makes better .  Also incidetnly with left lower back pain with tenderness adn swelling, worse to get up from chair.  Pt denies CP, worsening sob, doe, wheezing, orthopnea, pnd, worsening LE edema, palps, dizziness or syncope  Pt denies polydipsia, polyuria.  No fever, wt loss, night sweats, loss of appetite or other constitutional symptoms   Preventive Screening-Counseling & Management      Drug Use:  no.    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 5 Cc By Mouth Every 6 Hours As Needed For Cough/pain Symptoms  Current Medications (verified): 1)  Prednisone 10 Mg Tabs (Prednisone) .... 3po Qd For 3days, Then 2po Qd For 3days, Then 1po Qd For 3days, Then Stop 2)  Flexeril 5 Mg Tabs (Cyclobenzaprine Hcl) .Marland Kitchen.. 1po Three Times A Day As Needed  Allergies (verified): No Known Drug Allergies  Past History:  Past Medical History: Last updated: 08/10/2010 PULMONARY EMBOLISM, HX   March '09 Childhood asthma chicken pox as a child    Past Surgical History: Last updated: 09/27/2009 Hysterectomy-secondary to fibroids with oopherectomy  G3P2  TAB 1  Social History: Last updated: 11/03/2010 HSG, 1 semester of college married '92 - 1 son - '93, 1 daughter - '98 work: [employer] Drug use-no  Risk Factors: Caffeine Use: 0 (12/23/2007) Exercise: yes (12/23/2007)  Risk Factors: Smoking Status: never (12/23/2007)  Social History: HSG, 1 semester of college married '92 - 1 son - '93, 1 daughter - '98 work: [employer] Drug use-no  Review of Systems       all otherwise negative  per pt -    Physical Exam  General:  alert and overweight-appearing.   Head:  normocephalic and atraumatic.   Eyes:  vision grossly intact, pupils equal, and pupils round.   Ears:  R ear normal and L ear normal.   Nose:  no external deformity and no nasal discharge.   Mouth:  no gingival abnormalities and pharynx pink and moist.   Neck:  supple and no masses.   Lungs:  normal respiratory effort and normal breath sounds.   Heart:  normal rate and regular rhythm.   Abdomen:  soft, non-tender, and normal bowel sounds.   Msk:  right elbow with 2+ tender swellig at the right lateral epicondylar area;  also  tender to the right bicipital tendon insertion site, spine nontender without paravertebral tedner except for left lumbar tender muscluar spasm Extremities:  no edema, no erythema  Neurologic:  strength normal in all extremities, sensation intact to light touch, gait normal, and DTRs symmetrical and normal.   Skin:  color normal and no rashes.   Psych:  not depressed appearing and slightly anxious.     Impression & Recommendations:  Problem # 1:  LATERAL EPICONDYLITIS, RIGHT (ICD-726.32) mod to severe;  declines ortho and cortisone to start;  for work note - no lifting for 2 wks;  predpack asd;  and consider ortho if not improved or worse after back to lifitng at work; for tylenol as needed   Problem # 2:  SHOULDER PAIN, RIGHT (ICD-719.41)  mild, prob bicipital tendonitis by exam - same tx as above  Her updated medication list for this problem includes:    Flexeril 5 Mg Tabs (Cyclobenzaprine hcl) .Marland Kitchen... 1po three times a day as needed  Discussed shoulder exercises, use of moist heat or ice, and medication.   Problem # 3:  BACK PAIN (ICD-724.5)  with obvious left lumbar MSK spasm;  same tx as above, also for flexeril as needed   Her updated medication list for this problem includes:    Flexeril 5 Mg Tabs (Cyclobenzaprine hcl) .Marland Kitchen... 1po three times a day as needed  Discussed use of moist heat or ice, modified activities, medications, and stretching/strengthening exercises. To be seen in 2 weeks if no improvement; sooner if worsening of symptoms.   Problem # 4:  NECK PAIN (ICD-723.1)  low cervical in the midline but exam benign for acute - ok for tx as above, f/u any worsening symptoms but does not have radicalar pain at this time  Her updated medication list for this problem includes:    Flexeril 5 Mg Tabs (Cyclobenzaprine hcl) .Marland Kitchen... 1po three times a day as needed  Complete Medication List: 1)  Prednisone 10 Mg Tabs (Prednisone) .... 3po qd for 3days, then 2po qd for 3days, then  1po qd for 3days, then stop 2)  Flexeril 5 Mg Tabs (Cyclobenzaprine hcl) .Marland Kitchen.. 1po three times a day as needed  Patient Instructions: 1)  Please take all new medications as prescribed 2)  Continue all previous medications as before this visit 3)  You are given the work note today 4)  Please call or return for considertion of orthopedic referral for cortisone therapy 5)  Please schedule an appointment with your primary doctor as needed Prescriptions: FLEXERIL 5 MG TABS (CYCLOBENZAPRINE HCL) 1po three times a day as needed  #50 x 0   Entered and Authorized by:   Corwin Levins MD   Signed by:   Corwin Levins MD on 11/03/2010   Method  used:   Print then Give to Patient   RxID:   [id number] PREDNISONE 10 MG TABS (PREDNISONE) 3po qd for 3days, then 2po qd for 3days, then 1po qd for 3days, then stop  #18 x 0   Entered and Authorized by:   Corwin Levins MD   Signed by:   Corwin Levins MD on 11/03/2010   Method used:   Print then Give to Patient   RxID:   [id number]    Orders Added: 1)  Est. Patient Level IV [32951]

## 2010-11-14 ENCOUNTER — Encounter: Payer: Self-pay | Admitting: *Deleted

## 2010-11-16 ENCOUNTER — Ambulatory Visit (INDEPENDENT_AMBULATORY_CARE_PROVIDER_SITE_OTHER): Payer: PRIVATE HEALTH INSURANCE | Admitting: Internal Medicine

## 2010-11-16 ENCOUNTER — Encounter: Payer: Self-pay | Admitting: Internal Medicine

## 2010-11-16 DIAGNOSIS — M25519 Pain in unspecified shoulder: Secondary | ICD-10-CM

## 2010-11-16 DIAGNOSIS — M7710 Lateral epicondylitis, unspecified elbow: Secondary | ICD-10-CM

## 2010-11-16 NOTE — Progress Notes (Signed)
  Subjective:    Patient ID: Jasmine Watson, female    DOB: 1960/12/05, 50 y.o.   MRN: [id number]  HPI Jasmine Watson was seen by Dr. Jonny Ruiz on March 16th for right arm/elbow/shoulder pain - diagnosed with epicondylitis and bicipital tendonitis. She also had neck pain. She was treated with flexeril and steroid burst and taper. She reports that she got some relief with the iintial dosing of prednisone but the pain now is similar in distribution and severity as it was on the 16th. She is having numbness and tingling in the hand. She denies any frank weakness in the right arm and hand. She does have some pain across her right chest.   PMH, FMH, SocHx - reveiwed for changes and relevance  Review of Systems  Constitutional: Negative.  Negative for fever, chills and appetite change.  HENT: Negative.   Eyes: Negative.   Respiratory: Negative.   Cardiovascular: Negative.   Musculoskeletal:       Pain in the right elbow and shoulder and hand  Neurological: Negative.        Objective:   Physical Exam  Nursing note and vitals reviewed. Constitutional: She is oriented to person, place, and time. She appears well-developed and well-nourished.  HENT:  Head: Normocephalic and atraumatic.  Eyes: Conjunctivae are normal.  Neck: Normal range of motion. Neck supple.       No pain in the neck with movement or with rotation.  Cardiovascular: Normal rate.   Pulmonary/Chest: Effort normal and breath sounds normal.  Musculoskeletal:       Right UE with full RMOM at hand, wrist, elbow and shoulder, although there is a click in the right shoulder. Tenderness in the forearm laterally with pronation against resistance. Tender to palpation over the lateral epicondyle; tender at the biceps tendon and insertion.  Neurological: She is alert and oriented to person, place, and time. She has normal reflexes.       Normal sensation to light touch, pin-prick and deep vibratory sensation.           Assessment & Plan:    1. Lateral epicondylitis - symptoms are very similar to last visit. She did see some improvement with steroids at initial doses.  Plan - NSAID of choice - GI precautions given           Exercise and stretch - referred to YouTube  2. Bicipital tenodonitis - no evidence or findings to suggest central origin, i.e. Disk disease.  Plan - NSAID of choice           Exercise       If symptoms persist will consider imaging of cervical spine and /or referral to sports medicine.

## 2010-11-16 NOTE — Patient Instructions (Signed)
Tendonitis of the elbow - called lateral epicondylitis (tennis elbow) and tendonitis of the  Biceps tendon.    Plan - take aleve 2 tabs in the AM and 2 tabs in the PM. Watch for any stomach irritation: pain, grumbling, persistent hunger pains. If this happens stop the aleve.   Do simple range of motion exercises: go to [email or web address], in the search bar enter tennis elbow and exercise to see videos about exercises; also look up biceps tendonitis and exercise. Do your choice of exercises twice a day. If this does not seem to be improving in another 2 weeks let me know so I can refer you to a sport medicine doctor.  In terms of work and lifting - do what you can, but if it hurts don't do it.

## 2010-11-21 LAB — BASIC METABOLIC PANEL
CO2: 23 mEq/L (ref 19–32)
Creatinine, Ser: 0.78 mg/dL (ref 0.4–1.2)
GFR calc non Af Amer: >60 mL/min (ref >60)
Glucose, Bld: 90 mg/dL (ref 70–99)

## 2010-11-21 LAB — CBC
HCT: 40.2 % (ref 36.0–46.0)
Hemoglobin: 13.5 g/dL (ref 12.0–15.0)
MCHC: 33.7 g/dL (ref 30.0–36.0)
Platelets: 224 10*3/uL (ref 150–400)
WBC: 4.6 10*3/uL (ref 4.0–10.5)

## 2010-11-22 LAB — DIFFERENTIAL
Eosinophils Absolute: 0.1 10*3/uL (ref 0.0–0.7)
Eosinophils Relative: 2 % (ref 0–5)
Lymphs Abs: 2 10*3/uL (ref 0.7–4.0)
Monocytes Absolute: 0.5 10*3/uL (ref 0.1–1.0)

## 2010-11-22 LAB — COMPREHENSIVE METABOLIC PANEL
AST: 11 U/L (ref 0–37)
BUN: 11 mg/dL (ref 6–23)
GFR calc Af Amer: >60 mL/min (ref >60)

## 2010-11-22 LAB — URINALYSIS, ROUTINE W REFLEX MICROSCOPIC
Bilirubin Urine: NEGATIVE
Glucose, UA: NEGATIVE mg/dL
Ketones, ur: NEGATIVE mg/dL
Nitrite: NEGATIVE
Protein, ur: NEGATIVE mg/dL
Specific Gravity, Urine: 1.018 (ref 1.005–1.030)
Urobilinogen, UA: 0.2 mg/dL (ref 0.0–1.0)

## 2010-11-22 LAB — CBC
Hemoglobin: 12.9 g/dL (ref 12.0–15.0)
Platelets: 225 10*3/uL (ref 150–400)
RDW: 13.1 % (ref 11.5–15.5)
WBC: 6.1 10*3/uL (ref 4.0–10.5)

## 2010-11-22 LAB — URINE MICROSCOPIC-ADD ON

## 2010-11-22 LAB — POCT CARDIAC MARKERS
CKMB, poc: <1 ng/mL — ABNORMAL LOW (ref 1.0–8.0)
Myoglobin, poc: 78.2 ng/mL (ref 12–200)
Troponin i, poc: <0.05 ng/mL (ref 0.00–0.09)

## 2010-11-22 LAB — URINE CULTURE: Culture: NO GROWTH

## 2011-01-02 NOTE — Consult Note (Signed)
NAMECLARY, BOULAIS NO.:  [id number]   MEDICAL RECORD NO.:  [id number]          PATIENT TYPE:  INP   LOCATION:  2626                         FACILITY:  [facility]   PHYSICIAN:  Nelda Bucks, MD DATE OF BIRTH:  Nov 13, 1960   DATE OF CONSULTATION:  11/05/2007  DATE OF DISCHARGE:                                 CONSULTATION   REQUESTING PHYSICIAN:  Guy Sandifer. Henderson Cloud, M.D.   This is a 50 year old African American female gravida 2 para 2, status  post four weeks ago having a laparoscopic vaginal hysterectomy secondary  to fibroids that were symptomatic.  To note, the patient's mother had  history of phlebitis, possible DVT at similar age in the 39s.  She four  weeks ago had surgery and yesterday on the 17th of March developed acute  onset of left back pain around her ninth rib, which was nonradiating.  She also complained of some significant chest pain underneath the breast  with acute onset shortness of breath, which was with no major  exacerbating symptoms.  The pain as described in the left back and  underneath the left breast was nonradiating and otherwise was described  as sharp.  This pain also was worsened when she took a deep breath, what  we consider pleuritic chest pain, but she denied any central chest pain.  There is no leg pain recently and no pain in her calves, but she does  say that she has had limited ambulation since surgery and husband states  she has basically been on and off the couch.  She denies any changes in  her sputum production, no fevers, no hemoptysis.  No unexplained weight  loss over the last six months.  CAT scan was ordered by Dr. Henderson Cloud,  which shows bibasilar pulmonary embolism and we were asked for  consultation.   ALLERGIES:  No known drug allergies.   MEDICATIONS:  P.r.n. Percocet after discharge.   PAST MEDICAL/SURGICAL HISTORY:  1. Fibroids.  Status post laparoscopic vaginal hysterectomy as      described above four  weeks prior.  2. Sickle cell trait.  Very clearly stated that she does not have      sickle cell disease.   SOCIAL HISTORY:  She is married, two children age 5 and 56.  She does  not drink.  She does not do drugs.  She is a Psychologist, sport and exercise.  She has not  worked since she had her surgery.  She denies smoking.   FAMILY HISTORY:  Positive in her father who had prostate cancer and her  mother who had phlebitis, again possible DVT in her 30s unexplained and  not associated with any kind of surgery.  Her brother was told he had  kidney cancer and had a nephrectomy for that.   REVIEW OF SYSTEMS:  Negative for dysuria.  No abdominal pain.  No  nausea, no vomiting.  Again, no weight loss and no pain in her lymph  nodes under her axillary region or femoral region according to patient.   PHYSICAL EXAMINATION:  Physical examination showed a blood pressure of  135/81, heart rate 109, saturation 93% on room air.  Temperature 99.2,  respiratory rate 12.  GENERAL:  Inspection shows this patient to be alert, in mild pain,  distressed during deep inspiration.  HEENT:  Negative for lymphadenopathy.  Negative for bruits.  LUNGS:  Showed poor air entry, but were clear throughout anteriorly and  posteriorly with ____________  splinting secondary to pain, and had a  very poor inspiratory effort.  HEART:  S1, S2.  Regular rate and rhythm.  Heart sounds are mildly  distant, but clear and no right heave is noted.  No split S1 or S2.  ABDOMEN:  Showed well healed wounds.  Bowel sounds were normal, active.  No rebound, no guarding.  EXTREMITIES:  Showed no edema and no signs of unequalness to her legs.  Negative for Homan's sign.  NEUROLOGICAL:  Examination showed the patient alert and oriented x3.  Nonfocal.   LABORATORY DATA:  Shows a CAT scan that I reviewed with the radiologist,  which shows small bibasilar pulmonary emboli under the distal  subsegments with associated atelectasis with a small left-sided  pleural  effusion and significant atelectasis noted at the bases.  Laboratory  data shows a sodium of 133, potassium normal at 4.0, chloride 105,  bicarb 22, BUN  11, creatinine 0.76, glucose 133.  White count is  elevated at 12.4, hemoglobin 12.8, hematocrit 37.7, platelet count 309  with MCV of 83. Albumin is low at 3.4.  LFTs are normal.  Fibrinogen is  524, which is normal.  PT is 15.3.  INR is 1.2.  PTT 26.  ABGs show a pH  of 7.38, PCO2 of 36, PaO2 mildly low at 78 on room air with a bicarb of  21 and 96% saturation.   ASSESSMENT/PLAN:  1. Acute bibasilar bilateral pulmonary embolisms secondary to surgical      stress.  Rule out underlying genetic predisposition.  2. Significant atelectasis to bases status post surgery.  3. Mild hypoxemia secondary to #1 and #2.  4. Sickle cell trait.  5. Status post vaginal hysterectomy.  6. Mild hyponatremia.   At this time we will make consideration for moving the patient to [facility] for further therapy with heparin to Coumadin.  I  will plan Coumadin for a total of six months for this patient pending  laboratory and other findings.  I will also check Dopplers of lower  extremities.  Her EKG does not show any right heart strain.  Hemodynamically she is stable.  There is no need for any echo at this  time given her significant stability and the size of the pulmonary  embolisms, which appear to be small.  I think her atelectasis is  contributing significantly to her splinting and difficulty during  inspiration also contributing significantly to her pain during  inspiration.  We will need to give her aggressive incentive spirometry  and aggressive pain control.  I think also that the CT scan showing  atelectasis at the bases might be skewing our visualization and  undercrawling some of the pulmonary embolisms that may be present for  this patient.  A lupus anticoagulant peat, prothrombin time 16109, GU  mutation  homocysteine level and a Factor V Leiden have all been ordered  and will be pending for followup for [place].  As an outpatient, the patient  will have a repeat protein CS and antithrombin 3 levels, which would be  more accurate.  After clot has formed, it has a tendency  to be reduced  and need to be repeated as an outpatient when she is on Coumadin.  She  does state that she has sickle cell trait.  She denies history of sickle  cell disease.  She has never had a crisis in her life and her hemoglobin  is stable at this time.  We will keep her saturation at 94 to 95% or  greater, currently requiring room air.  We will also check a serum  osmolarity, urin osmolarity, urine sodium for her mild hyponatremia.  We  will ___________ given the fact that she has another small effusion on  the left-hand side.  We will assess her volume status using the studies  I have just mentioned.  Patient will be taking a full, normal diet  I do  not see any infarcted lung, although given her pain discomfort, you  would think that maybe that was a possibility.  CT scan clearly does not  show infarcted lung.  We will repeat a portable chest x-ray tomorrow  morning and follow her fever curve as well as her white blood cell  count.  I think her white blood cell count probably is somewhat of a  demargination response from the pulmonary embolisms as well.  I would  have a very low threshold for starting antibiotics for this patient if  she developed any kind of fever.  At this point there is no need for  blood cultures or sputum, but we will follow her very closely.  If this  develops, we would be assessing those.  I have described the  plan with Dr. Henderson Cloud who is agreeable to transferring this patient to  our service PCCM as well as to the patient's husband and patient at the  bedside.  All questions and concerns have bene answered for them.   PLAN OF COURSE:  Heparin to Coumadinization for possibly six weeks.       Nelda Bucks, MD  Electronically Signed     DJF/MEDQ  D:  11/05/2007  T:  11/05/2007  Job:  [id number]

## 2011-01-02 NOTE — Op Note (Signed)
NAMEMIGNON, BECHLER                ACCOUNT NO.:  [id number]   MEDICAL RECORD NO.:  [id number]          PATIENT TYPE:  INP   LOCATION:  9317                          FACILITY:  [facility]   PHYSICIAN:  Michelle L. Grewal, M.D.DATE OF BIRTH:  May 22, 1961   DATE OF PROCEDURE:  10/07/2007  DATE OF DISCHARGE:  10/09/2007                               OPERATIVE REPORT   PREOPERATIVE DIAGNOSIS:  Symptomatic fibroids, menorrhagia, and  dysmenorrhea.   POSTOPERATIVE DIAGNOSIS:  Symptomatic fibroids, menorrhagia, and  dysmenorrhea.   PROCEDURE:  Laparoscopic-assisted vaginal hysterectomy.   SURGEON:  Michelle L. Vincente Poli, M.D.   ASSISTANTFreddy Finner, M.D.   ANESTHESIA:  General.   ESTIMATED BLOOD LOSS:  300 mL   DRAINS:  Foley.   PROCEDURE:  The patient was taken to the operating room after informed  consent was obtained.  She was then prepped and draped in the usual  sterile fashion.  A Foley catheter with was inserted and draining clear  urine.   The uterine manipulator was inserted into the uterus.  Attention was  turned to the abdomen.  A small infraumbilical incision was made, and  the Veress needle was inserted.  Pneumoperitoneum was performed.  The  Veress needle was removed, and an 11-mm trocar was inserted.  The  laparoscope was introduced through the trocar sheath.  A 5-mm suprapubic  trocar was placed under direct visualization.  Exam revealed the uterus  was enlarged, about 12 weeks' size and myomatous.  Adnexa were normal.  We then identified the triple pedicle on either side.  I took the gyrus  and placed it just across the triple pedicle on the right and then on  the left, burned it, and carried that down easily through to the round  ligament on each side.  Hemostasis was excellent.  We then removed the  laparoscope but kept the trocars in and released the pneumoperitoneum.  I then placed a weighted speculum in the vagina.  A circumferential  incision was made around  the cervix.  The posterior cul-de-sac was  entered sharply using Mayo scissors, and the anterior cul-de-sac was  entered sharply using Metzenbaum scissors.  Curved Heaney clamps were  placed just across the uterosacral cardinal ligaments on either side.  Each pedicle was cut and suture ligated using 0 Vicryl suture.  I walked  my way up the broad ligament.  Each pedicle was suture ligated using 0  Vicryl suture.  Once we reached the level of the triple pedicle, the  uterus was retroflexed and then removed without difficulty.  The vaginal  cuff was closed from 3 to 9 o'clock in continuous running locked stitch  using 0 Vicryl suture.  The cuff was closed completely anterior to  posterior using a running locked stitch using 0 Vicryl suture.  At this  point, we then went back up to the abdomen, and I replaced the  pneumoperitoneum.  The cul-de-sac was irrigated, and everything  was hemostatic.  We released the pneumoperitoneum, removed the trocars  carefully, and then closed the incisions using 3-0 Vicryl and Dermabond  skin  adhesive.  All sponge, lap and instrument counts were correct x2.   The patient went to recovery room in stable condition.      Michelle L. Vincente Poli, M.D.  Electronically Signed     MLG/MEDQ  D:  10/12/2007  T:  10/13/2007  Job:  [id number]

## 2011-01-02 NOTE — Discharge Summary (Signed)
NAMEEYLIN, PONTARELLI NO.:  [id number]   MEDICAL RECORD NO.:  [id number]          PATIENT TYPE:  INP   LOCATION:  5040                         FACILITY:  [facility]   PHYSICIAN:  Nelda Bucks, MD DATE OF BIRTH:  09-05-1960   DATE OF ADMISSION:  11/05/2007  DATE OF DISCHARGE:  11/12/2007                               DISCHARGE SUMMARY   FINAL DIAGNOSES:  1. Bilateral pulmonary emboli with left pleural effusion, atelectasis,      and probable pulmonary infarct.  2. Chest pain secondary to bilateral pulmonary emboli with left      pleural effusion, atelectasis, and probable pulmonary infarct.  3. Status post laparoscopic-assisted vaginal hysterectomy.   LABORATORY DATA:  Date November 10, 2007, PT/INR 29.9/2.7.  Date November 11, 2007, PT/INR 35.4/3.4.  Date November 12, 2007, PT/INR is 36.3/3.5.  Antinuclear antibody panel was positive.  Date November 12, 2007,  hemoglobin 9.8, hematocrit 28.8, platelet count 321. CT angio completed  of chest completed on November 11, 2007, with the following findings:  No  new central pulmonary emboli.  Prior segmental emboli not seen.  Increased atelectasis and possible left lower lobe infarct with  heterogeneous infusion.  Increased left effusion.  No evidence of right  heart strain.   BRIEF HISTORY:  This is a 50 year old African American female status  post laparoscopic vaginal hysterectomy approximately 4 weeks prior to  admission, developed onset of left back pain around her ninth rib, which  was nonradiating in nature.  She also complained of significant chest  pain under the left rib and left breast with exertional dyspnea.  She  underwent a CT of chest, which demonstrated positive findings consistent  with pulmonary emboli, the pulmonary critical care service was asked to  evaluate.   HOSPITAL COURSE BY DISCHARGE DIAGNOSES:  1. Bilateral pulmonary emboli with probable left lower lobe pulmonary      infarct, left pleural effusion,  and atelectasis.  Ms. Lanphear was      transferred from [facility] on November 05, 2007, to [facility].  Therapy consisted of IV heparin as well as Coumadin      bridge.  Her INR was dosed, she became therapeutic, and actually      supratherapeutic during hospitalization.  She has been maintained      on a heparin drip during this time.  She is now status post 4 days      of overlap.  Hemodynamically stable, no evidence of bleeding.  Upon      time of discharge, the CT findings are as noted.  Her pain is      resolving, or controllable with low-dose narcotics, and no new      evidence of worsening new pulmonary emboli.  She will be discharged      to home with instructions to follow at the [facility]      Coumadin [facility].  She will followup with Dr. Marcelyn Bruins in the      outpatient setting for further pulmonary needs.  2. Pain secondary to  bilateral pulmonary emboli with probable left      lower lobe pulmonary infarct, left pleural effusion, and      atelectasis.  This has been managed with oral narcotics.  She now      reports her pain is much controlled.  3. Gastroesophageal reflux disease.  Plan for this is to continue      Protonix at home.   DISCHARGE INSTRUCTIONS:  Diet, Coumadin restriction diet.   ACTIVITY:  As tolerated.   FOLLOWUP:  Dr. Marcelyn Bruins on Tuesday, November 18, 2007, at 4:15 p.m.  Also Dr. Debby Bud as a primary care Gabryella Murfin on Dec 23, 2007.  She also has  Coumadin clinic followup on the 27th which is a Friday of March.   DISCHARGE MEDICATIONS:  1. Protonix p.o. daily.  2. Warfarin.  She was provided 5-mg tablets, and instructed to not      take these until instructed by the pharmacist from the Coumadin      clinic.  Additionally, she was prescribed a total of 60 Percocet      tablets without refill.  She was instructed she can take 1-2      tablets every 6 hours as needed for pain.  Ms. Donlan was instructed      that should she develop  worsening fever, chest discomfort, or pain      worse than current level, additionally increased shortness of      breath, she should report to the nearest emergency room.  Ms. Dicocco      has met maximum benefit from inpatient care.  She will now be      released to home where she will undergo the remainder of her      recovery.      Zenia Resides, NP      Nelda Bucks, MD  Electronically Signed    PB/MEDQ  D:  11/12/2007  T:  11/13/2007  Job:  [id number]   cc:   Barbaraann Share, MD,FCCP  Stann Mainland Vincente Poli, M.D.  Rosalyn Gess Norins, MD

## 2011-01-05 NOTE — Op Note (Signed)
NAMELACYE, MCCARN                ACCOUNT NO.:  [id number]   MEDICAL RECORD NO.:  [id number]          PATIENT TYPE:  AMB   LOCATION:  SDC                           FACILITY:  [facility]   PHYSICIAN:  Michelle L. Grewal, M.D.DATE OF BIRTH:  Sep 15, 1960   DATE OF PROCEDURE:  08/30/2004  DATE OF DISCHARGE:                                 OPERATIVE REPORT   PREOPERATIVE DIAGNOSIS:  Menorrhagia and endometrial polyps.   POSTOPERATIVE DIAGNOSIS:  Menorrhagia and endometrial polyps.   OPERATION PERFORMED:  Dilation and curettage and diagnostic hysteroscopy.   SURGEON:  Michelle L. Vincente Poli, M.D.   ASSISTANT:  None.   ANESTHESIA:  MAC with paracervical block.   SPECIMENS:  Uterine curettings.   ESTIMATED BLOOD LOSS:  50 mL.   DESCRIPTION OF PROCEDURE:  The patient was taken to the operating room and  she was given sedation.  She was placed in lithotomy position and she was  prepped and draped in the usual sterile fashion.  An in and out catheter was  used to empty the bladder.  The speculum was inserted into the vagina.  The  cervix was grasped with a tenaculum and the cervical internal os was gently  dilated using Pratt dilators after a paracervical block was performed.  She  tolerated that very well.  A diagnostic hysteroscope was inserted into the  uterine cavity and with good visualization, we were able to note two polyps  emanating from the left wall of the uterus and the posterior wall of the  uterus.  The hysteroscope was removed and a sharp curet was inserted and the  uterus thoroughly curetted of all tissue.  Polyp forceps were used to remove  the remainder of the tissue.  We then reinserted the diagnostic hysteroscope  and the uterine cavity was cleaned.  All instruments were removed from the  uterine cavity.  Sponge, lap, needle and instruments were all correct times  two.  The patient tolerated the procedure well, was given Toradol and went  to recovery room in stable  condition.     [place]   MLG/MEDQ  D:  08/30/2004  T:  08/30/2004  Job:  [id number]

## 2011-05-11 LAB — CBC
HCT: 29.8 — ABNORMAL LOW
HCT: 39
Hemoglobin: 10.3 — ABNORMAL LOW
Hemoglobin: 13.2
MCHC: 33.9
MCHC: 34.7
MCV: 85.2
MCV: 85.4
Platelets: 213
RBC: 4.56
RDW: 12.6
WBC: 5.5

## 2011-05-11 LAB — BASIC METABOLIC PANEL
CO2: 22
Calcium: 8.4
Chloride: 99
Creatinine, Ser: 0.84
GFR calc Af Amer: >60
GFR calc non Af Amer: >60
Glucose, Bld: 84
Potassium: 3.7

## 2011-05-14 LAB — CBC
HCT: 28.8 — ABNORMAL LOW
HCT: 29.7 — ABNORMAL LOW
HCT: 30.7 — ABNORMAL LOW
HCT: 31.5 — ABNORMAL LOW
HCT: 31.6 — ABNORMAL LOW
HCT: 33.4 — ABNORMAL LOW
Hemoglobin: 12.8
Hemoglobin: 10.1 — ABNORMAL LOW
Hemoglobin: 10.3 — ABNORMAL LOW
Hemoglobin: 10.5 — ABNORMAL LOW
Hemoglobin: 10.6 — ABNORMAL LOW
Hemoglobin: 11.1 — ABNORMAL LOW
Hemoglobin: 11.4 — ABNORMAL LOW
Hemoglobin: 9.8 — ABNORMAL LOW
MCHC: 33.8
MCHC: 32.8
MCHC: 33.2
MCHC: 34
MCV: 83.2
MCV: 83.5
Platelets: 309
Platelets: 263
Platelets: 321
RBC: 3.58 — ABNORMAL LOW
RBC: 3.68 — ABNORMAL LOW
RBC: 3.79 — ABNORMAL LOW
RBC: 4
RBC: 4.01
RBC: 4.06
RDW: 12.4
RDW: 12.7
WBC: 10.2
WBC: 11.1 — ABNORMAL HIGH
WBC: 6.3
WBC: 6.6
WBC: 7.1

## 2011-05-14 LAB — LUPUS ANTICOAGULANT PANEL
DRVVT: 49.5 — ABNORMAL HIGH (ref 36.1–47.0)
Lupus Anticoagulant: NOT DETECTED
dRVVT Incubated 1:1 Mix: 41.9 (ref 36.1–47.0)

## 2011-05-14 LAB — ANTITHROMBIN III: AntiThromb III Func: 112 (ref 76–126)

## 2011-05-14 LAB — COMPREHENSIVE METABOLIC PANEL
ALT: 23
Albumin: 3.4 — ABNORMAL LOW
Alkaline Phosphatase: 61
Calcium: 8.6
Glucose, Bld: 133 — ABNORMAL HIGH
Potassium: 4
Sodium: 133 — ABNORMAL LOW
Total Protein: 7.9

## 2011-05-14 LAB — PROTIME-INR
INR: 1.9 — ABNORMAL HIGH
INR: 2.7 — ABNORMAL HIGH
INR: 3.4 — ABNORMAL HIGH
INR: 3.5 — ABNORMAL HIGH
Prothrombin Time: 17.3 — ABNORMAL HIGH
Prothrombin Time: 29.9 — ABNORMAL HIGH
Prothrombin Time: 35.4 — ABNORMAL HIGH
Prothrombin Time: 36.3 — ABNORMAL HIGH

## 2011-05-14 LAB — DIC (DISSEMINATED INTRAVASCULAR COAGULATION)PANEL
D-Dimer, Quant: 1.35 — ABNORMAL HIGH
Fibrinogen: 524 — ABNORMAL HIGH
Platelets: 309
Prothrombin Time: 15.3 — ABNORMAL HIGH
aPTT: 26

## 2011-05-14 LAB — SEDIMENTATION RATE: Sed Rate: 36 — ABNORMAL HIGH

## 2011-05-14 LAB — CARDIAC PANEL(CRET KIN+CKTOT+MB+TROPI)
Relative Index: INVALID
Total CK: 29

## 2011-05-14 LAB — CARDIOLIPIN ANTIBODIES, IGG, IGM, IGA
Anticardiolipin IgG: <7 — ABNORMAL LOW (ref <11)
Anticardiolipin IgM: <7 — ABNORMAL LOW (ref <10)

## 2011-05-14 LAB — BASIC METABOLIC PANEL
CO2: 25
Calcium: 8.6
GFR calc Af Amer: >60
Potassium: 3.7
Sodium: 135

## 2011-05-14 LAB — BLOOD GAS, ARTERIAL
Acid-base deficit: 2.8 — ABNORMAL HIGH
Bicarbonate: 21.1
TCO2: 22.3
pCO2 arterial: 36.2
pH, Arterial: 7.384

## 2011-05-14 LAB — OSMOLALITY: Osmolality: 275

## 2011-05-14 LAB — AMYLASE: Amylase: 69

## 2011-05-14 LAB — HEPARIN LEVEL (UNFRACTIONATED): Heparin Unfractionated: 0.35

## 2011-05-14 LAB — ANTI-NUCLEAR AB-TITER (ANA TITER): ANA Titer 1: 1:40 {titer} — ABNORMAL HIGH

## 2011-05-14 LAB — BETA-2-GLYCOPROTEIN I ABS, IGG/M/A
Beta-2 Glyco I IgG: 5 U/mL (ref <20)
Beta-2-Glycoprotein I IgM: <4 U/mL (ref <10)

## 2011-05-14 LAB — SODIUM, URINE, RANDOM: Sodium, Ur: 31

## 2012-04-21 ENCOUNTER — Emergency Department (HOSPITAL_COMMUNITY)
Admission: EM | Admit: 2012-04-21 | Discharge: 2012-04-21 | Disposition: A | Discharge status: Home / Self Care | Payer: No Typology Code available for payment source | Attending: Emergency Medicine | Admitting: Emergency Medicine

## 2012-04-21 ENCOUNTER — Encounter (HOSPITAL_COMMUNITY): Payer: Self-pay | Admitting: Family Medicine

## 2012-04-21 DIAGNOSIS — R071 Chest pain on breathing: Secondary | ICD-10-CM | POA: Insufficient documentation

## 2012-04-21 DIAGNOSIS — Z803 Family history of malignant neoplasm of breast: Secondary | ICD-10-CM | POA: Insufficient documentation

## 2012-04-21 DIAGNOSIS — S335XXA Sprain of ligaments of lumbar spine, initial encounter: Secondary | ICD-10-CM | POA: Insufficient documentation

## 2012-04-21 DIAGNOSIS — Z8051 Family history of malignant neoplasm of kidney: Secondary | ICD-10-CM | POA: Insufficient documentation

## 2012-04-21 DIAGNOSIS — Z8249 Family history of ischemic heart disease and other diseases of the circulatory system: Secondary | ICD-10-CM | POA: Insufficient documentation

## 2012-04-21 DIAGNOSIS — Z8042 Family history of malignant neoplasm of prostate: Secondary | ICD-10-CM | POA: Insufficient documentation

## 2012-04-21 DIAGNOSIS — Z800 Family history of malignant neoplasm of digestive organs: Secondary | ICD-10-CM | POA: Insufficient documentation

## 2012-04-21 DIAGNOSIS — Z833 Family history of diabetes mellitus: Secondary | ICD-10-CM | POA: Insufficient documentation

## 2012-04-21 MED ORDER — HYDROCODONE-ACETAMINOPHEN 5-325 MG PO TABS: 2.0000 | ORAL_TABLET | ORAL | Status: AC | PRN

## 2012-04-21 MED ORDER — HYDROCODONE-ACETAMINOPHEN 5-325 MG PO TABS
2.0000 | ORAL_TABLET | Freq: Once | ORAL | Status: AC
  Administered 2012-04-21: 2 via ORAL
  Filled 2012-04-21: qty 2

## 2012-04-21 NOTE — ED Notes (Signed)
Pt ambulatory with pain, pt AxO x4, follows commands, speaks in complete sentences, Pupils equal & reactive to light, no memory loss
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
## 2012-04-22 ENCOUNTER — Other Ambulatory Visit: Payer: Self-pay | Admitting: Obstetrics and Gynecology

## 2012-04-25 ENCOUNTER — Encounter: Payer: Self-pay | Admitting: Gastroenterology

## 2012-06-11 ENCOUNTER — Other Ambulatory Visit: Payer: PRIVATE HEALTH INSURANCE | Admitting: Gastroenterology

## 2012-08-22 ENCOUNTER — Ambulatory Visit (AMBULATORY_SURGERY_CENTER): Payer: PRIVATE HEALTH INSURANCE | Admitting: *Deleted

## 2012-08-22 VITALS — Ht 64.0 in | Wt 181.0 lb

## 2012-08-22 DIAGNOSIS — Z1211 Encounter for screening for malignant neoplasm of colon: Secondary | ICD-10-CM

## 2012-08-22 MED ORDER — MOVIPREP 100 G PO SOLR: ORAL | Status: DC

## 2012-08-27 ENCOUNTER — Encounter: Payer: Self-pay | Admitting: Gastroenterology

## 2012-09-05 ENCOUNTER — Encounter: Payer: Self-pay | Admitting: Gastroenterology

## 2012-09-05 ENCOUNTER — Ambulatory Visit (AMBULATORY_SURGERY_CENTER): Payer: PRIVATE HEALTH INSURANCE | Admitting: Gastroenterology

## 2012-09-05 VITALS — BP 125/50 | HR 63 | Temp 97.5°F | Resp 17 | Ht 64.0 in | Wt 181.0 lb

## 2012-09-05 DIAGNOSIS — Z1211 Encounter for screening for malignant neoplasm of colon: Secondary | ICD-10-CM

## 2012-09-05 MED ORDER — SODIUM CHLORIDE 0.9 % IV SOLN: 500.0000 mL | INTRAVENOUS | Status: DC

## 2012-09-05 NOTE — Patient Instructions (Signed)
YOU HAD AN ENDOSCOPIC PROCEDURE TODAY AT THE [facility]: Refer to the procedure report that was given to you for any specific questions about what was found during the examination.  If the procedure report does not answer your questions, please call your gastroenterologist to clarify.  If you requested that your care partner not be given the details of your procedure findings, then the procedure report has been included in a sealed envelope for you to review at your convenience later.  YOU SHOULD EXPECT: Some feelings of bloating in the abdomen. Passage of more gas than usual.  Walking can help get rid of the air that was put into your GI tract during the procedure and reduce the bloating. If you had a lower endoscopy (such as a colonoscopy or flexible sigmoidoscopy) you may notice spotting of blood in your stool or on the toilet paper. If you underwent a bowel prep for your procedure, then you may not have a normal bowel movement for a few days.  DIET: Your first meal following the procedure should be a light meal and then it is ok to progress to your normal diet.  A half-sandwich or bowl of soup is an example of a good first meal.  Heavy or fried foods are harder to digest and may make you feel nauseous or bloated.  Likewise meals heavy in dairy and vegetables can cause extra gas to form and this can also increase the bloating.  Drink plenty of fluids but you should avoid alcoholic beverages for 24 hours.  ACTIVITY: Your care partner should take you home directly after the procedure.  You should plan to take it easy, moving slowly for the rest of the day.  You can resume normal activity the day after the procedure however you should NOT DRIVE or use heavy machinery for 24 hours (because of the sedation medicines used during the test).    SYMPTOMS TO REPORT IMMEDIATELY: A gastroenterologist can be reached at any hour.  During normal business hours, 8:30 AM to 5:00 PM Monday through Friday,  call [phone].  After hours and on weekends, please call the GI answering service at [phone] who will take a message and have the physician on call contact you.   Following lower endoscopy (colonoscopy or flexible sigmoidoscopy):  Excessive amounts of blood in the stool  Significant tenderness or worsening of abdominal pains  Swelling of the abdomen that is new, acute  Fever of 100F or higher    FOLLOW UP: If any biopsies were taken you will be contacted by phone or by letter within the next 1-3 weeks.  Call your gastroenterologist if you have not heard about the biopsies in 3 weeks.  Our staff will call the home number listed on your records the next business day following your procedure to check on you and address any questions or concerns that you may have at that time regarding the information given to you following your procedure. This is a courtesy call and so if there is no answer at the home number and we have not heard from you through the emergency physician on call, we will assume that you have returned to your regular daily activities without incident.  SIGNATURES/CONFIDENTIALITY: You and/or your care partner have signed paperwork which will be entered into your electronic medical record.  These signatures attest to the fact that that the information above on your After Visit Summary has been reviewed and is understood.  Full responsibility of the confidentiality   of this discharge information lies with you and/or your care-partner.     Information on diverticulosis & high fiber diet given to you today 

## 2012-09-05 NOTE — Op Note (Addendum)
[facility] [address] [place], [address]   COLONOSCOPY PROCEDURE REPORT  PATIENT: Jasmine, Watson  MR#: [id number] BIRTHDATE: Nov 22, 1960 , 51  yrs. old GENDER: Female ENDOSCOPIST: Rachael Fee, MD REFERRED AO:[id number] Esther Hardy, M.D. PROCEDURE DATE:  09/05/2012 PROCEDURE:   Colonoscopy, screening ASA CLASS:   Class II INDICATIONS:average risk screening. MEDICATIONS: Fentanyl 100 mcg IV, Versed 6 mg IV, and These medications were titrated to patient response per physician's verbal order  DESCRIPTION OF PROCEDURE:   After the risks benefits and alternatives of the procedure were thoroughly explained, informed consent was obtained.  A digital rectal exam revealed no abnormalities of the rectum.   The LB [id number]  endoscope was introduced through the anus and advanced to the cecum, which was identified by both the appendix and ileocecal valve. No adverse events experienced.   The quality of the prep was good, using MoviPrep  The instrument was then slowly withdrawn as the colon was fully examined.  COLON FINDINGS: A normal appearing cecum, ileocecal valve, and appendiceal orifice were identified.  The ascending, hepatic flexure, transverse, splenic flexure, descending, sigmoid colon and rectum appeared unremarkable.  No polyps or cancers were seen. There was mild left sided diverticulosis  Retroflexed views revealed no abnormalities. The time to cecum=6 minutes 05 seconds. Withdrawal time=5 minutes 22 seconds.  The scope was withdrawn and the procedure completed. COMPLICATIONS: There were no complications.  ENDOSCOPIC IMPRESSION: Normal colon  except for mild left sided diverticulosis No polyps or cancers  RECOMMENDATIONS: You should continue to follow colorectal cancer screening guidelines for "routine risk" patients with a repeat colonoscopy in 10 years. There is no need for FOBT (stool) testing for at least 5 years.   eSigned:  Rachael Fee, MD 09/05/2012 9:59 AM Revised: 09/05/2012 9:59 AM

## 2012-09-05 NOTE — Progress Notes (Signed)
The pt did have some cramping with the scope advancement to the cecum medications titrated per md orders.  Once the cecum was reached the pt relaxed and rested comfortably. Maw

## 2012-09-05 NOTE — Progress Notes (Signed)
Patient did not experience any of the following events: a burn prior to discharge; a fall within the facility; wrong site/side/patient/procedure/implant event; or a hospital transfer or hospital admission upon discharge from the facility. (G8907) Patient did not have preoperative order for IV antibiotic SSI prophylaxis. (G8918)  

## 2012-09-08 ENCOUNTER — Telehealth: Payer: Self-pay | Admitting: *Deleted

## 2012-09-08 NOTE — Telephone Encounter (Signed)
  Follow up Call-  Call back number 09/05/2012  Post procedure Call Back phone  # [phone]  Permission to leave phone message Yes     Patient questions:  Do you have a fever, pain , or abdominal swelling? yes Pain Score  5 *  Have you tolerated food without any problems? yes  Have you been able to return to your normal activities? yes  Do you have any questions about your discharge instructions: Diet   no Medications  no Follow up visit  no  Do you have questions or concerns about your Care? no  Actions: * If pain score is 4 or above: Physician/ provider Notified : Rob Bunting, MD. pt says she worked sat & sun was scheduled off today so she is at home , pt c/o LLQ abd pain since procedure which is a 5 on pain scale . Questioned pt as to what foods she has been eating,pt denied gas producing foods. Pt. Denies fever , bleeding , or abd being tight or hard. Pt says she feels bloated, Encouraged pt to call back if pain does not go away today. Encouraged pt to drink warm liquids and stay away from gas producing foods and we discussed what foods are gas producing. This c/o pain will be forwarded to Dr. Christella Hartigan.

## 2012-09-09 ENCOUNTER — Telehealth: Payer: Self-pay | Admitting: Gastroenterology

## 2012-09-09 NOTE — Telephone Encounter (Signed)
Pt had colon 09/05/12 and is worried that she has not had a BM since the procedure.  I advised the pt that it takes a few days to have regular bowel movements after the colon prep.  She was told to drink plenty of fluids and if she has not had a bowel movement in a few more days to call back.  Pt agreed

## 2012-09-11 NOTE — Telephone Encounter (Signed)
Left message on machine to call back  

## 2012-09-11 NOTE — Telephone Encounter (Signed)
I am out of town this week. Seeing this note for first time.  Patty, Can you check up on her, see how she is feeling in AM (thurs)  thanksw

## 2012-09-12 NOTE — Telephone Encounter (Signed)
Left message on machine and with family member to have the pt return my call no response received

## 2012-11-12 ENCOUNTER — Ambulatory Visit (INDEPENDENT_AMBULATORY_CARE_PROVIDER_SITE_OTHER)
Admission: RE | Admit: 2012-11-12 | Discharge: 2012-11-12 | Disposition: A | Discharge status: Home / Self Care | Payer: PRIVATE HEALTH INSURANCE | Source: Ambulatory Visit | Attending: Internal Medicine | Admitting: Internal Medicine

## 2012-11-12 ENCOUNTER — Other Ambulatory Visit (INDEPENDENT_AMBULATORY_CARE_PROVIDER_SITE_OTHER): Payer: PRIVATE HEALTH INSURANCE

## 2012-11-12 ENCOUNTER — Encounter: Payer: Self-pay | Admitting: Internal Medicine

## 2012-11-12 ENCOUNTER — Ambulatory Visit (INDEPENDENT_AMBULATORY_CARE_PROVIDER_SITE_OTHER): Payer: PRIVATE HEALTH INSURANCE | Admitting: Internal Medicine

## 2012-11-12 VITALS — BP 122/68 | HR 72 | Temp 97.9°F | Ht 64.0 in | Wt 180.0 lb

## 2012-11-12 DIAGNOSIS — M5430 Sciatica, unspecified side: Secondary | ICD-10-CM

## 2012-11-12 DIAGNOSIS — R200 Anesthesia of skin: Secondary | ICD-10-CM

## 2012-11-12 DIAGNOSIS — M5432 Sciatica, left side: Secondary | ICD-10-CM

## 2012-11-12 DIAGNOSIS — R209 Unspecified disturbances of skin sensation: Secondary | ICD-10-CM

## 2012-11-12 IMAGING — CR DG LUMBAR SPINE COMPLETE 4+V
5 series · 5 of 5 positions shown · non-contrast
Comparison: CT [DATE] to

CLINICAL DATA: low back spasms.

LUMBAR SPINE - COMPLETE 4+ VIEW

[view not recorded (1 of 5)]
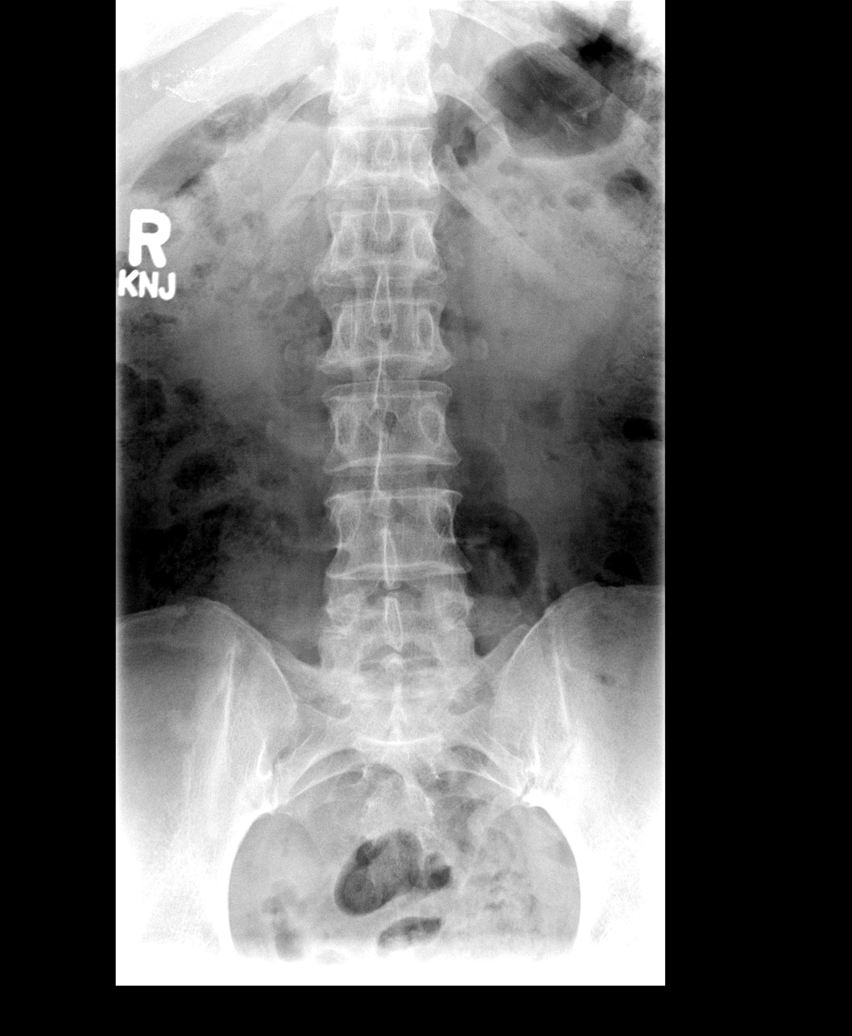

[view not recorded (2 of 5)]
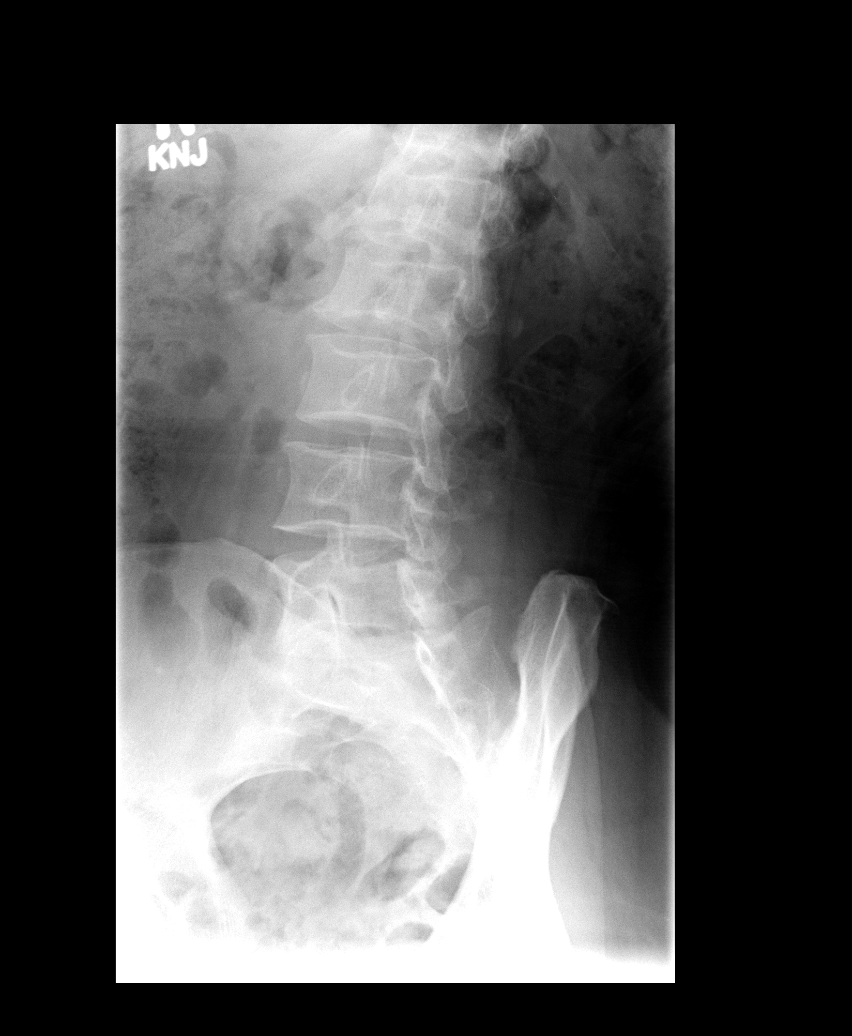

[view not recorded (3 of 5)]
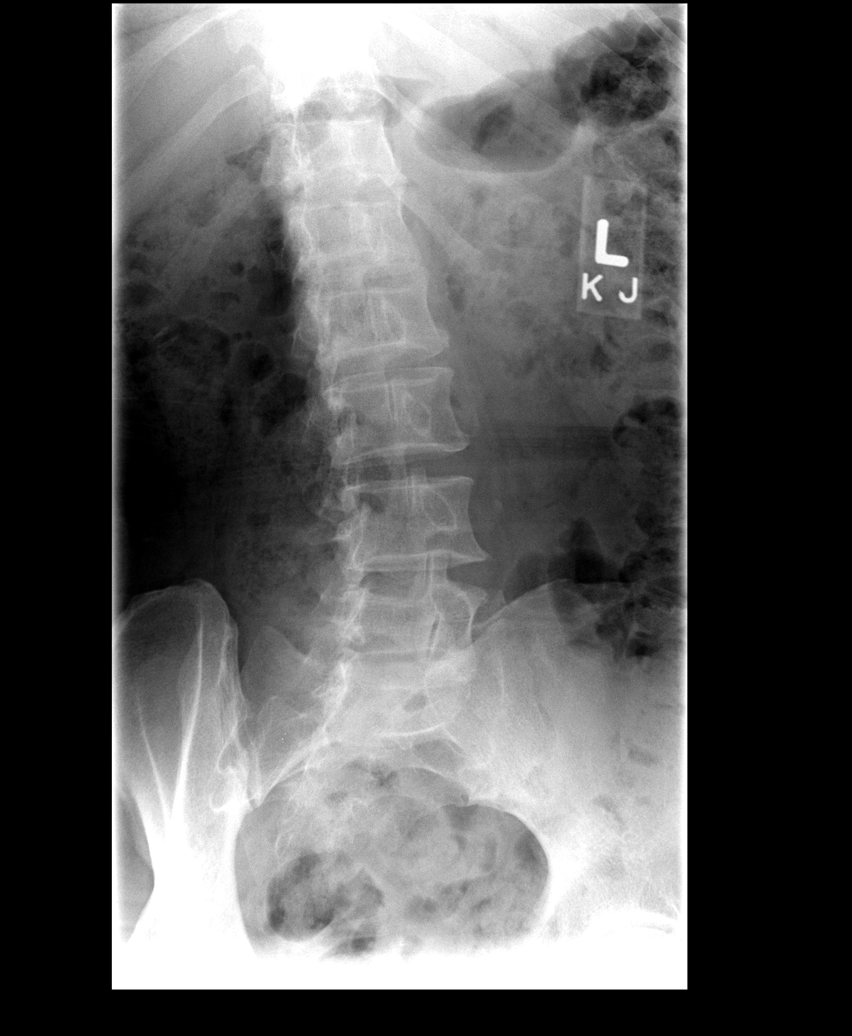

[view not recorded (4 of 5)]
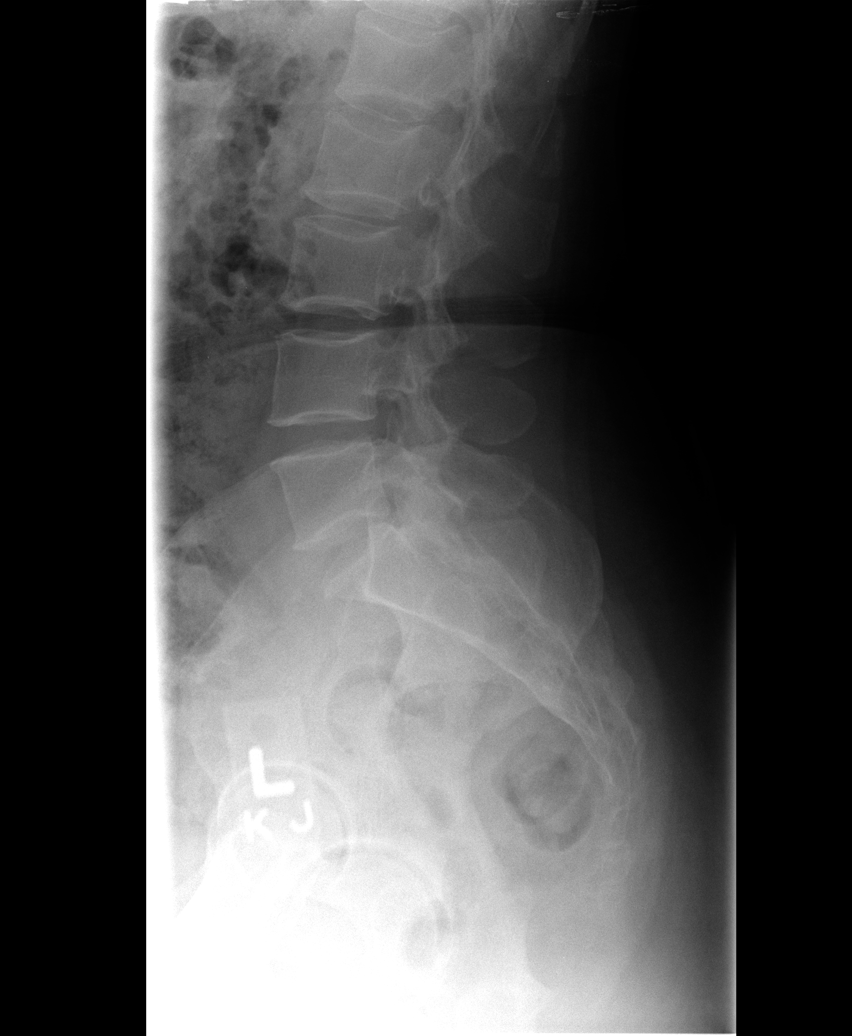

[view not recorded (5 of 5)]
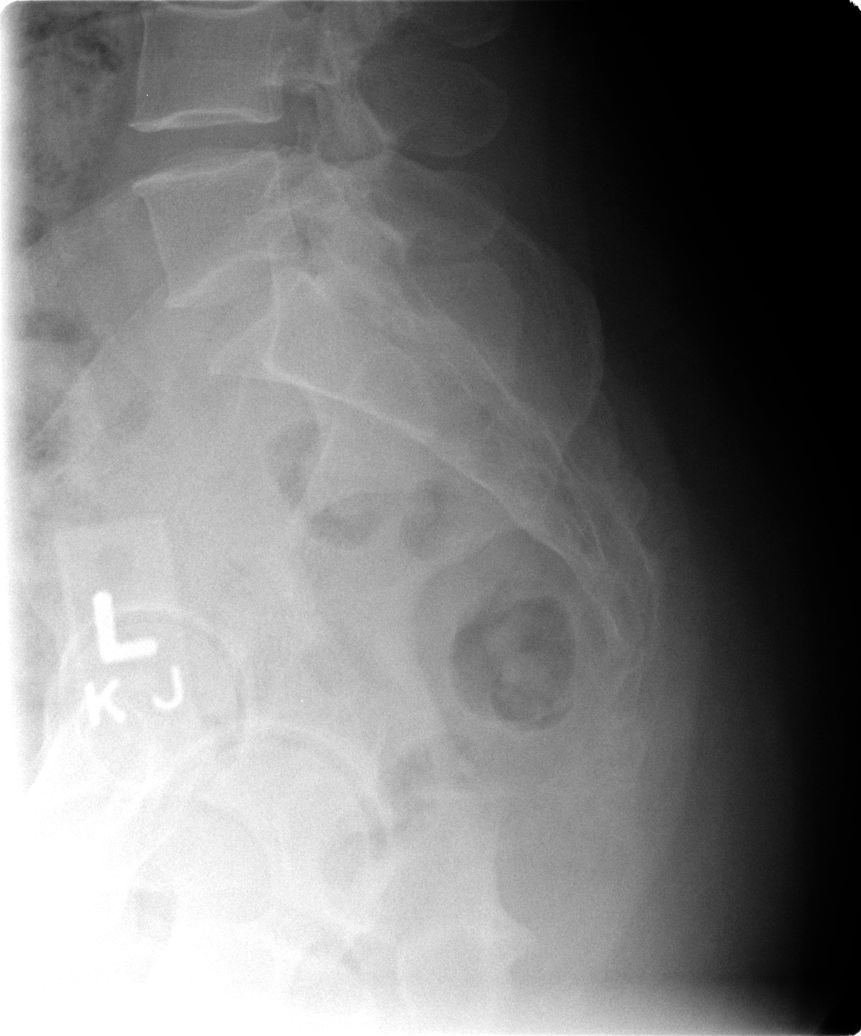

[5 of 5 positions shown; findings below may reference images not displayed]

FINDINGS: Normal alignment.  No fracture.  Disc spaces are
maintained.  SI joints are symmetric and unremarkable.
IMPRESSION: No acute bony abnormality.

## 2012-11-12 MED ORDER — PREDNISONE 10 MG PO KIT: 1.0000 | PACK | Freq: Once | ORAL | Status: DC

## 2012-11-12 NOTE — Progress Notes (Signed)
Subjective:    Patient ID: Jasmine Watson, female    DOB: Oct 08, 1960, 52 y.o.   MRN: [id number]  HPI  Pt presents to the clinic today with c/o pain shooting down her left leg. This started 3 days ago. She reports the pain as sharp. 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 She denies a specific injury to her back. She has never had symptoms like this before. She denies loss of bowel or bladder. Additionally, pt c/o of a constant numbness and tingling in her feet. This has been going on for the the last 3 months that she can remember. She has never had this sensation before. She does not have diabetes or neuropathy that she knows of. Her father did have diabetes. She would like to be checked for this today.  Review of Systems      Past Medical History  Diagnosis Date  . History of pulmonary embolism 10/2007  . Childhood asthma   . History of chicken pox   . Lupus     skin    Current Outpatient Prescriptions  Medication Sig Dispense Refill  . PredniSONE 10 MG KIT Take 1 kit (10 mg total) by mouth once.  21 each  0   No current facility-administered medications for this visit.    No Known Allergies  Family History  Problem Relation Age of Onset  . Prostate cancer Father     Survivor  . Diabetes Father   . Hypertension Father   . Cancer Brother     Renal cell carcinoma  . Colon cancer Neg Hx   . Breast cancer Neg Hx   . Stomach cancer Neg Hx   . Coronary artery disease Other     History   Social History  . Marital Status: Married    Spouse Name: N/A    Number of Children: N/A  . Years of Education: N/A   Occupational History  . Nursing ass[employer]    Social History Main Topics  . Smoking status: Never Smoker   . Smokeless tobacco: Never Used  . Alcohol Use: Yes     Comment: occasional  . Drug Use: No  . Sexually Active: Not on file   Other Topics Concern  . Not on file   Social History Narrative   HSG, 1  semester college   Married '92   1 son - '93, 1 daughter - '98           Constitutional: Denies fever, malaise, fatigue, headache or abrupt weight changes.  Musculoskeletal: Denies decrease in range of motion, difficulty with gait, muscle pain or joint pain and swelling.   Neurological: Pt reports numbness and tingling in bilateral feet. Pt reports sharp shootin pains down her leg. Denies dizziness, difficulty with memory, difficulty with speech or problems with balance and coordination.   No other specific complaints in a complete review of systems (except as listed in HPI above).  Objective:   Physical Exam   BP 122/68  Pulse 72  Temp(Src) 97.9 F (36.6 C) (Oral)  Ht 5\' 4"  (1.626 m)  Wt 180 lb (81.647 kg)  BMI 30.88 kg/m2  SpO2 98% Wt Readings from Last 3 Encounters:  11/12/12 180 lb (81.647 kg)  09/05/12 181 lb (82.101 kg)  08/22/12 181 lb (82.101 kg)    General: Appears her stated age, well developed, well nourished in NAD.  Cardiovascular: Normal rate and rhythm. S1,S2 noted.  No murmur,  rubs or gallops noted. No JVD or BLE edema. No carotid bruits noted. Pulmonary/Chest: Normal effort and positive vesicular breath sounds. No respiratory distress. No wheezes, rales or ronchi noted.  Musculoskeletal: Normal range of motion. No signs of joint swelling. No difficulty with gait.  Neurological: Alert and oriented. Cranial nerves II-XII intact. Coordination normal. +DTRs bilaterally. Positive straight leg raise.       Assessment & Plan:   Sciatica Neuraligia, left side, new onset with additional workup required:  Will obtain xray of lumbar spine eRx for pred pack  Numbness and tingling of lower extremities, new onset:  Will obtain HgbA1C to r/o diabetes as the source of the neuropathy

## 2012-11-12 NOTE — Patient Instructions (Signed)
Sciatica Sciatica is pain, weakness, numbness, or tingling along the path of the sciatic nerve. The nerve starts in the lower back and runs down the back of each leg. The nerve controls the muscles in the lower leg and in the back of the knee, while also providing sensation to the back of the thigh, lower leg, and the sole of your foot. Sciatica is a symptom of another medical condition. For instance, nerve damage or certain conditions, such as a herniated disk or bone spur on the spine, pinch or put pressure on the sciatic nerve. This causes the pain, weakness, or other sensations normally associated with sciatica. Generally, sciatica only affects one side of the body. CAUSES   Herniated or slipped disc.  Degenerative disk disease.  A pain disorder involving the narrow muscle in the buttocks (piriformis syndrome).  Pelvic injury or fracture.  Pregnancy.  Tumor (rare). SYMPTOMS  Symptoms can vary from mild to very severe. The symptoms usually travel from the low back to the buttocks and down the back of the leg. Symptoms can include:  Mild tingling or dull aches in the lower back, leg, or hip.  Numbness in the back of the calf or sole of the foot.  Burning sensations in the lower back, leg, or hip.  Sharp pains in the lower back, leg, or hip.  Leg weakness.  Severe back pain inhibiting movement. These symptoms may get worse with coughing, sneezing, laughing, or prolonged sitting or standing. Also, being overweight may worsen symptoms. DIAGNOSIS  Your caregiver will perform a physical exam to look for common symptoms of sciatica. He or she may ask you to do certain movements or activities that would trigger sciatic nerve pain. Other tests may be performed to find the cause of the sciatica. These may include:  Blood tests.  X-rays.  Imaging tests, such as an MRI or CT scan. TREATMENT  Treatment is directed at the cause of the sciatic pain. Sometimes, treatment is not necessary  and the pain and discomfort goes away on its own. If treatment is needed, your caregiver may suggest:  Over-the-counter medicines to relieve pain.  Prescription medicines, such as anti-inflammatory medicine, muscle relaxants, or narcotics.  Applying heat or ice to the painful area.  Steroid injections to lessen pain, irritation, and inflammation around the nerve.  Reducing activity during periods of pain.  Exercising and stretching to strengthen your abdomen and improve flexibility of your spine. Your caregiver may suggest losing weight if the extra weight makes the back pain worse.  Physical therapy.  Surgery to eliminate what is pressing or pinching the nerve, such as a bone spur or part of a herniated disk. HOME CARE INSTRUCTIONS   Only take over-the-counter or prescription medicines for pain or discomfort as directed by your caregiver.  Apply ice to the affected area for 20 minutes, 3 4 times a day for the first 48 72 hours. Then try heat in the same way.  Exercise, stretch, or perform your usual activities if these do not aggravate your pain.  Attend physical therapy sessions as directed by your caregiver.  Keep all follow-up appointments as directed by your caregiver.  Do not wear high heels or shoes that do not provide proper support.  Check your mattress to see if it is too soft. A firm mattress may lessen your pain and discomfort. SEEK IMMEDIATE MEDICAL CARE IF:   You lose control of your bowel or bladder (incontinence).  You have increasing weakness in the lower back,   pelvis, buttocks, or legs.  You have redness or swelling of your back.  You have a burning sensation when you urinate.  You have pain that gets worse when you lie down or awakens you at night.  Your pain is worse than you have experienced in the past.  Your pain is lasting longer than 4 weeks.  You are suddenly losing weight without reason. MAKE SURE YOU:  Understand these  instructions.  Will watch your condition.  Will get help right away if you are not doing well or get worse. Document Released: 07/31/2001 Document Revised: 02/05/2012 Document Reviewed: 12/16/2011 Exit[employer] Patient Information 2013 [employer].  

## 2012-11-13 ENCOUNTER — Ambulatory Visit: Payer: PRIVATE HEALTH INSURANCE | Admitting: Internal Medicine

## 2012-11-14 ENCOUNTER — Telehealth: Payer: Self-pay | Admitting: Internal Medicine

## 2012-11-14 NOTE — Telephone Encounter (Signed)
Pt was returning a call to the office regarding test results.  Per EPIC,  RN found notes from NP [facility].  "Can you please call Mrs. Kreuzer and let her know that her xray is normal and that her labs showed that she does not have diabetes. If she continues to have the numbness and tingling in her feet, she can return for a follow up OV and we can discuss the next steps."  RN relayed the information to patient who verbalized understanding.  Pt asked if she should continue the medication prescribed (Prednisone) and RN advised patient to continue telling pt to not abruptly stop a steroid.  Pt verbalized understanding.

## 2013-02-23 ENCOUNTER — Emergency Department (HOSPITAL_COMMUNITY)
Admission: EM | Admit: 2013-02-23 | Discharge: 2013-02-23 | Disposition: A | Discharge status: Home / Self Care | Payer: PRIVATE HEALTH INSURANCE | Attending: Emergency Medicine | Admitting: Emergency Medicine

## 2013-02-23 ENCOUNTER — Encounter (HOSPITAL_COMMUNITY): Payer: Self-pay | Admitting: *Deleted

## 2013-02-23 DIAGNOSIS — L02818 Cutaneous abscess of other sites: Secondary | ICD-10-CM | POA: Insufficient documentation

## 2013-02-23 DIAGNOSIS — L0390 Cellulitis, unspecified: Secondary | ICD-10-CM

## 2013-02-23 DIAGNOSIS — R079 Chest pain, unspecified: Secondary | ICD-10-CM | POA: Insufficient documentation

## 2013-02-23 DIAGNOSIS — L299 Pruritus, unspecified: Secondary | ICD-10-CM | POA: Insufficient documentation

## 2013-02-23 DIAGNOSIS — Z8619 Personal history of other infectious and parasitic diseases: Secondary | ICD-10-CM | POA: Insufficient documentation

## 2013-02-23 DIAGNOSIS — Z86711 Personal history of pulmonary embolism: Secondary | ICD-10-CM | POA: Insufficient documentation

## 2013-02-23 DIAGNOSIS — R599 Enlarged lymph nodes, unspecified: Secondary | ICD-10-CM | POA: Insufficient documentation

## 2013-02-23 DIAGNOSIS — J45909 Unspecified asthma, uncomplicated: Secondary | ICD-10-CM | POA: Insufficient documentation

## 2013-02-23 DIAGNOSIS — R51 Headache: Secondary | ICD-10-CM | POA: Insufficient documentation

## 2013-02-23 DIAGNOSIS — Z872 Personal history of diseases of the skin and subcutaneous tissue: Secondary | ICD-10-CM | POA: Insufficient documentation

## 2013-02-23 MED ORDER — HYDROCODONE-ACETAMINOPHEN 5-325 MG PO TABS
2.0000 | ORAL_TABLET | Freq: Once | ORAL | Status: AC
  Administered 2013-02-23: 2 via ORAL
  Filled 2013-02-23: qty 2

## 2013-02-23 MED ORDER — HYDROCODONE-ACETAMINOPHEN 5-325 MG PO TABS: 2.0000 | ORAL_TABLET | Freq: Four times a day (QID) | ORAL | Status: DC | PRN

## 2013-02-23 MED ORDER — CEPHALEXIN 250 MG PO CAPS
500.0000 mg | ORAL_CAPSULE | Freq: Once | ORAL | Status: AC
  Administered 2013-02-23: 500 mg via ORAL
  Filled 2013-02-23: qty 2

## 2013-02-23 MED ORDER — CEPHALEXIN 500 MG PO CAPS: 500.0000 mg | ORAL_CAPSULE | Freq: Four times a day (QID) | ORAL | Status: DC
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
## 2013-02-23 NOTE — ED Notes (Signed)
Pt states she put dye in her hair Saturday morning and then started having swelling to face.  Pt is also having neck pain with swelling.  Pt states started having some hurting in her chest last nite.
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
## 2013-02-24 ENCOUNTER — Telehealth: Payer: Self-pay

## 2013-02-24 NOTE — Telephone Encounter (Signed)
Ok to squeeze in

## 2013-02-24 NOTE — Telephone Encounter (Signed)
Phone call from patient. She states she went to the ED yesterday due to face and neck being swollen maybe from the dye that was put in her hair. She has used this same dye before. She was given Cephalexin 500 mg she started this morning. I let her know she does need to give the medicine time to work. She would like blood to be done to make sure it's nothing else. Do you want to squeeze her in on your schedule tomorrow? Please advise. Thanks.

## 2013-02-25 ENCOUNTER — Ambulatory Visit (INDEPENDENT_AMBULATORY_CARE_PROVIDER_SITE_OTHER): Payer: PRIVATE HEALTH INSURANCE | Admitting: Internal Medicine

## 2013-02-25 ENCOUNTER — Encounter: Payer: Self-pay | Admitting: Internal Medicine

## 2013-02-25 DIAGNOSIS — T7589XS Other specified effects of external causes, sequela: Secondary | ICD-10-CM

## 2013-02-25 DIAGNOSIS — T788XXS Other adverse effects, not elsewhere classified, sequela: Secondary | ICD-10-CM

## 2013-02-25 MED ORDER — PREDNISONE 10 MG PO TABS: 10.0000 mg | ORAL_TABLET | Freq: Every day | ORAL | Status: DC

## 2013-02-25 NOTE — Patient Instructions (Addendum)
Contact allergy scalp - every time you are exposed to the same allergen the reaction is more rapid in on-set and more severe!!  Plan  prednisone  Burst and taper  Finish antibiotic - keflex  Warm compresses to enlarged lymph nodes  Sudafed otc 30 mg twice a day  Allergy eye drops - otc: for red eyes, change in vision or pain see eye doctor.  Return to work on Monday.

## 2013-02-25 NOTE — Telephone Encounter (Signed)
Pt has an appt today 02/25/13 at 11:30 am. Work in per [employer]

## 2013-02-25 NOTE — Telephone Encounter (Signed)
Called pt and left vm for pt to return call.

## 2013-02-25 NOTE — Telephone Encounter (Signed)
Per Dr Debby Bud, okay to schedule patient today at whatever time she perfers. Please call patient to schedule her appt . Thanks.

## 2013-02-25 NOTE — Progress Notes (Signed)
  Subjective:    Patient ID: Jasmine Watson, female    DOB: 05-19-1961, 52 y.o.   MRN: [id number]  HPI Seen July 2 for scalp pain and headache soon after application new hair product. She diagnosed with possible cellulitis vs allergic reaction. She has continued to have scalp tenderness, severe headache when she bends forward and tender posterior cervical nodes.   PMH, FamHx and SocHx reviewed for any changes and relevance. Current Outpatient Prescriptions on File Prior to Visit  Medication Sig Dispense Refill  . cephALEXin (KEFLEX) 500 MG capsule Take 1 capsule (500 mg total) by mouth 4 (four) times daily.  28 capsule  0  . HYDROcodone-acetaminophen (NORCO) 5-325 MG per tablet Take 2 tablets by mouth every 6 (six) hours as needed for pain.  16 tablet  0   No current facility-administered medications on file prior to visit.      Review of Systems System review is negative for any constitutional, cardiac, pulmonary, GI or neuro symptoms or complaints other than as described in the HPI.     Objective:   Physical Exam Filed Vitals:   02/25/13 1200  BP: 138/86  Pulse: 75  Temp: 97.8 F (36.6 C)   HEENT- [place]/AT, tender to palpation of the scalp, normal swallow Cor- RRR Pulm - normal respirations Derm - scalp appears irritated c/w allergic reaction. Neuro - normal and non-focal       Assessment & Plan:  Contact allergy scalp - every time you are exposed to the same allergen the reaction is more rapid in on-set and more severe!!  Plan  prednisone  Burst and taper  Finish antibiotic - keflex  Warm compresses to enlarged lymph nodes  Sudafed otc 30 mg twice a day  Allergy eye drops - otc: for red eyes, change in vision or pain see eye doctor.

## 2013-09-15 ENCOUNTER — Other Ambulatory Visit: Payer: Self-pay | Admitting: Obstetrics and Gynecology

## 2014-06-21 ENCOUNTER — Encounter: Payer: Self-pay | Admitting: Internal Medicine

## 2014-10-27 ENCOUNTER — Other Ambulatory Visit: Payer: Self-pay | Admitting: Obstetrics and Gynecology

## 2014-10-28 LAB — CYTOLOGY - PAP

## 2016-04-11 DIAGNOSIS — Z79899 Other long term (current) drug therapy: Secondary | ICD-10-CM | POA: Insufficient documentation

## 2016-04-11 DIAGNOSIS — L438 Other lichen planus: Secondary | ICD-10-CM

## 2016-04-11 HISTORY — DX: Other lichen planus: L43.8

## 2016-10-07 DIAGNOSIS — H6502 Acute serous otitis media, left ear: Secondary | ICD-10-CM | POA: Insufficient documentation

## 2017-12-12 ENCOUNTER — Ambulatory Visit: Payer: PRIVATE HEALTH INSURANCE | Admitting: Family Medicine

## 2017-12-12 ENCOUNTER — Encounter: Payer: Self-pay | Admitting: Family Medicine

## 2017-12-12 ENCOUNTER — Other Ambulatory Visit: Payer: Self-pay | Admitting: Family Medicine

## 2017-12-12 VITALS — BP 120/82 | HR 80 | Ht 64.5 in | Wt 179.4 lb

## 2017-12-12 DIAGNOSIS — Z1331 Encounter for screening for depression: Secondary | ICD-10-CM | POA: Insufficient documentation

## 2017-12-12 DIAGNOSIS — R5382 Chronic fatigue, unspecified: Secondary | ICD-10-CM

## 2017-12-12 DIAGNOSIS — R490 Dysphonia: Secondary | ICD-10-CM

## 2017-12-12 DIAGNOSIS — L438 Other lichen planus: Secondary | ICD-10-CM | POA: Diagnosis not present

## 2017-12-12 HISTORY — DX: Dysphonia: R49.0

## 2017-12-12 MED ORDER — PANTOPRAZOLE SODIUM 40 MG PO TBEC: 40.0000 mg | DELAYED_RELEASE_TABLET | Freq: Every day | ORAL | 3 refills | Status: DC

## 2017-12-12 NOTE — Assessment & Plan Note (Signed)
Depression screen PHQ 2/9 12/12/2017  Decreased Interest 1  Down, Depressed, Hopeless 2  PHQ - 2 Score 3  Altered sleeping 3  Tired, decreased energy 3  Change in appetite 1  Feeling bad or failure about yourself  1  Trouble concentrating 2  Moving slowly or fidgety/restless 2  Suicidal thoughts 0  PHQ-9 Score 15   GAD 7 : Generalized Anxiety Score 12/12/2017  Nervous, Anxious, on Edge 1  Control/stop worrying 3  Worry too much - different things 3  Trouble relaxing 2  Restless 1  Easily annoyed or irritable 3  Afraid - awful might happen 3  Total GAD 7 Score 16     PHQ completed today showing mild depressive symptoms, she feels like this is related to work and home and trying to balance the two.  She denies SI/HI and feels like she is handling stressors fairly well.  She declines medications.    15 minutes spent completing/interpreting PHQ and GAD scales and reviewing results/counseling with patient.

## 2017-12-12 NOTE — Assessment & Plan Note (Signed)
Check TSH and CBC today

## 2017-12-12 NOTE — Progress Notes (Signed)
Jasmine Watson - 57 y.o. female MRN [id number]  Date of birth: 03/01/1961  Subjective Chief Complaint  Patient presents with  . Hoarse    HPI Jasmine Watson is a 57 y.o. female here today to establish care with new pcp.  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  She is treated with methorexate and folic acid which she is doing well with.   Her main concern today is ongoing hoarseness and dysphonia with some mild dysphagia at times.  She reports that this has been going on for several weeks.  It is worsened with talking for long periods of time.  She does have cough at times but denies shortness of breath.  She does admit to increased reflux symptoms and has been using tums nearly every day.  She also has had some fatigue as well.  She denies abdominal pain, dark stools, nasal congestion or drainage, chest pain or wheezing.   ROS:  ROS was completed and negative except as noted in HPI  Allergies  Allergen Reactions  . Shellfish Allergy Anaphylaxis    Past Medical History:  Diagnosis Date  . Childhood asthma   . History of chicken pox   . History of pulmonary embolism 10/2007  . Lupus (HCC)    skin    Past Surgical History:  Procedure Laterality Date  . TOTAL ABDOMINAL HYSTERECTOMY W/ BILATERAL SALPINGOOPHORECTOMY     secondary to fibroids    Social History   Socioeconomic History  . Marital status: Married    Spouse name: Not on file  . Number of children: Not on file  . Years of education: Not on file  . Highest education level: Not on file  Occupational History  . 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 Financial resource strain: Not on file  . Food insecurity:    Worry: Not on file    Inability: Not on file  . Transportation needs:    Medical: Not on file    Non-medical: Not on file  Tobacco Use  . Smoking status: Never Smoker  . Smokeless tobacco: Never Used    Substance and Sexual Activity  . Alcohol use: Yes    Comment: occasional  . Drug use: No  . Sexual activity: Not on file  Lifestyle  . Physical activity:    Days per week: Not on file    Minutes per session: Not on file  . Stress: Not on file  Relationships  . Social connections:    Talks on phone: Not on file    Gets together: Not on file    Attends religious service: Not on file    Active member of club or organization: Not on file    Attends meetings of clubs or organizations: Not on file    Relationship status: Not on file  Other Topics Concern  . Not on file  Social History Narrative   HSG, 1 semester college   Married '92   1 son - '93, 1 daughter - '98          Family History  Problem Relation Age of Onset  . Prostate cancer Father        Survivor  . Diabetes Father   . Hypertension Father   . Cancer Brother        Renal cell carcinoma  . Coronary artery disease Other   . Colon cancer Neg  Hx   . Breast cancer Neg Hx   . Stomach cancer Neg Hx     Health Maintenance  Topic Date Due  . Hepatitis C Screening  03-17-1961  . HIV Screening  06/06/1976  . TETANUS/TDAP  06/06/1980  . MAMMOGRAM  06/07/2011  . PAP SMEAR  10/26/2017  . INFLUENZA VACCINE  03/20/2018  . COLONOSCOPY  09/05/2022    ----------------------------------------------------------------------------------------------------------------------------------------------------------------------------------------------------------------- Physical Exam BP (!) 120/100 (BP Location: Right Arm, Patient Position: Sitting, Cuff Size: Normal)   Pulse 80   Ht 5' 4.5" (1.638 m)   Wt 179 lb 6.4 oz (81.4 kg)   BMI 30.32 kg/m   Physical Exam  Constitutional: She is oriented to person, place, and time. She appears well-nourished. No distress.  Some audible dysphonia and straining with talking at times.   HENT:  Head: Atraumatic.  Mouth/Throat: Oropharynx is clear and moist.  Eyes: No scleral icterus.   Neck: Neck supple. No thyromegaly present.  Cardiovascular: Normal rate, regular rhythm and normal heart sounds.  Pulmonary/Chest: Effort normal and breath sounds normal.  Abdominal: Soft. She exhibits no distension. There is no tenderness.  Lymphadenopathy:    She has no cervical adenopathy.  Neurological: She is alert and oriented to person, place, and time.  Psychiatric: She has a normal mood and affect. Her behavior is normal.    ------------------------------------------------------------------------------------------------------------------------------------------------------------------------------------------------------------------- Assessment and Plan  Screening for depression Depression screen [facility] 2/9 12/12/2017  Decreased Interest 1  Down, Depressed, Hopeless 2  PHQ - 2 Score 3  Altered sleeping 3  Tired, decreased energy 3  Change in appetite 1  Feeling bad or failure about yourself  1  Trouble concentrating 2  Moving slowly or fidgety/restless 2  Suicidal thoughts 0  PHQ-9 Score 15   GAD 7 : Generalized Anxiety Score 12/12/2017  Nervous, Anxious, on Edge 1  Control/stop worrying 3  Worry too much - different things 3  Trouble relaxing 2  Restless 1  Easily annoyed or irritable 3  Afraid - awful might happen 3  Total GAD 7 Score 16     PHQ completed today showing mild depressive symptoms, she feels like this is related to work and home and trying to balance the two.  She denies SI/HI and feels like she is handling stressors fairly well.  She declines medications.    15 minutes spent completing/interpreting PHQ and GAD scales and reviewing results/counseling with patient.    Hoarseness I think her symptoms are related to reflux Start protonix 40mg  daily Given handout on foods that may worsen reflux symptoms F/u in 6 weeks, if not improving will refer to ENT  Chronic fatigue Check TSH and CBC today   Lichen planus pigmentosus Stable, she will continue to  follow with her dermatologist through [facility].

## 2017-12-12 NOTE — Patient Instructions (Signed)
I think your symptoms may be coming from reflux Start protonix as directed.  See me again in about 6 weeks.   Hoarseness Hoarseness is any abnormal change in your voice.Hoarseness can make it difficult to speak. Your voice may sound raspy, breathy, or strained. Hoarseness is caused by a problem with the vocal cords. The vocal cords are two bands of tissue inside your voice box (larynx). When you speak, your vocal cords move back and forth to create sound. The surfaces of your vocal cords need to be smooth for your voice to sound clear. Swelling or lumps on the vocal cords can cause hoarseness. Common causes of vocal cord problems include:  Upper airway infection.  A long-term cough.  Straining or overusing your voice.  Smoking.  Allergies.  Vocal cord growths.  Stomach acids that flow up from your stomach and irritate your vocal cords (gastroesophageal reflux).  Follow these instructions at home: Watch your condition for any changes. To ease any discomfort that you feel:  Rest your voice. Do not whisper. Whispering can cause muscle strain.  Do not speak in a loud or harsh voice that makes your hoarseness worse.  Do not use any tobacco products, including cigarettes, chewing tobacco, or electronic cigarettes. If you need help quitting, ask your health care provider.  Avoid secondhand smoke.  Do not eat foods that give you heartburn. Heartburn can make gastroesophageal reflux worse.  Do not drink coffee.  Do not drink alcohol.  Drink enough fluids to keep your urine clear or pale yellow.  Use a humidifier if the air in your home is dry.  Contact a health care provider if:  You have hoarseness that lasts longer than 3 weeks.  You almost lose or completelylose your voice for longer than 3 days.  You have pain when you swallow or try to talk.  You feel a lump in your neck. Get help right away if:  You have trouble swallowing.  You feel as though you are choking  when you swallow.  You cough up blood or vomit blood.  You have trouble breathing. This information is not intended to replace advice given to you by your health care provider. Make sure you discuss any questions you have with your health care provider. Document Released: 07/20/2005 Document Revised: 01/12/2016 Document Reviewed: 07/28/2014 Elsevier Interactive Patient Education  [employer]2018 Elsevier Inc.

## 2017-12-12 NOTE — Assessment & Plan Note (Signed)
I think her symptoms are related to reflux Start protonix 40mg  daily Given handout on foods that may worsen reflux symptoms F/u in 6 weeks, if not improving will refer to ENT

## 2017-12-12 NOTE — Assessment & Plan Note (Signed)
Stable, she [facility]

## 2017-12-13 LAB — TSH: TSH: 1.15 u[IU]/mL (ref 0.35–4.50)

## 2017-12-13 LAB — CBC
HCT: 38.5 % (ref 36.0–46.0)
Hemoglobin: 12.9 g/dL (ref 12.0–15.0)
MCHC: 33.5 g/dL (ref 30.0–36.0)
MCV: 88.1 fl (ref 78.0–100.0)
PLATELETS: 226 10*3/uL (ref 150.0–400.0)
RBC: 4.37 Mil/uL (ref 3.87–5.11)
RDW: 13.7 % (ref 11.5–15.5)
WBC: 6.9 10*3/uL (ref 4.0–10.5)

## 2017-12-17 ENCOUNTER — Telehealth: Payer: Self-pay | Admitting: Family Medicine

## 2017-12-17 ENCOUNTER — Ambulatory Visit: Payer: Self-pay | Admitting: *Deleted

## 2017-12-17 NOTE — Telephone Encounter (Signed)
Left VM for pt to call back to discuss symptoms.

## 2017-12-17 NOTE — Telephone Encounter (Signed)
Pt callling to check status of request

## 2017-12-17 NOTE — Telephone Encounter (Signed)
Called in c/o continued cough.   She was sick with a respiratory infection about a month ago but the cough is not resolving especially at night it's really bad.   She is coughing up yellow sputum.  Also c/o her ears popping and feeling full.  See triage notes below.  I made her an appt with Dr. Everrett Coombe at the [facility] office for 12/18/17 3:30PM.   Reason for Disposition . [1] Continuous (nonstop) coughing interferes with work or school AND [2] no improvement using cough treatment per Care Advice  Answer Assessment - Initial Assessment Questions 1. ONSET: "When did the cough begin?"      It's bad a night.   Started Saturday.   I was in bed all day Sunday 2. SEVERITY: "How bad is the cough today?"      I'm working.    When I lay down it's really bad.   I'm having trouble sleeping. 3. RESPIRATORY DISTRESS: "Describe your breathing."      Yellow sputum is what I'm coughing up.    Nothing is helping my cough OTC.   My ears are popping and hurting.   I've had ear infections before.   My throat and chest are not hurting.   I was sick a month ago with a respiratory infection.     4. FEVER: "Do you have a fever?" If so, ask: "What is your temperature, how was it measured, and when did it start?"     No 5. SPUTUM: "Describe the color of your sputum" (clear, white, yellow, green)     Yellow sputum 6. HEMOPTYSIS: "Are you coughing up any blood?" If so ask: "How much?" (flecks, streaks, tablespoons, etc.)     No blood.  7. CARDIAC HISTORY: "Do you have any history of heart disease?" (e.g., heart attack, congestive heart failure)      No 8. LUNG HISTORY: "Do you have any history of lung disease?"  (e.g., pulmonary embolus, asthma, emphysema)     You have had a blood clot in my lung in 2009.   No blood thinners. 9. PE RISK FACTORS: "Do you have a history of blood clots?" (or: recent major surgery, recent prolonged travel, bedridden )     No surgery 10. OTHER SYMPTOMS: "Do you have any other  symptoms?" (e.g., runny nose, wheezing, chest pain)       I wheeze a little. 11. PREGNANCY: "Is there any chance you are pregnant?" "When was your last menstrual period?"       No 12. TRAVEL: "Have you traveled out of the country in the last month?" (e.g., travel history, exposures)       No.   I work around elderly people.  Protocols used: COUGH - ACUTE PRODUCTIVE-A-AH

## 2017-12-17 NOTE — Telephone Encounter (Signed)
Patient has appointment 5/1.

## 2017-12-17 NOTE — Telephone Encounter (Signed)
See below

## 2017-12-17 NOTE — Telephone Encounter (Signed)
Called  Left  VM to call back and discuss  Symptoms

## 2017-12-17 NOTE — Telephone Encounter (Signed)
Noted  

## 2017-12-17 NOTE — Telephone Encounter (Signed)
Copied from CRM [id number]. Topic: Quick Communication - Rx Refill/Question >> Dec 17, 2017 10:54 AM Alexander Bergeron B wrote: Pt called b/c she cannot stop coughing and its getting really bad pt states, pt would like to have a medication called in for her if possible, call pt to advise

## 2017-12-18 ENCOUNTER — Ambulatory Visit: Payer: PRIVATE HEALTH INSURANCE | Admitting: Family Medicine

## 2017-12-18 ENCOUNTER — Encounter: Payer: Self-pay | Admitting: Family Medicine

## 2017-12-18 DIAGNOSIS — R05 Cough: Secondary | ICD-10-CM | POA: Diagnosis not present

## 2017-12-18 DIAGNOSIS — R059 Cough, unspecified: Secondary | ICD-10-CM | POA: Insufficient documentation

## 2017-12-18 MED ORDER — GUAIFENESIN-CODEINE 100-10 MG/5ML PO SOLN: 5.0000 mL | Freq: Three times a day (TID) | ORAL | 0 refills | Status: DC | PRN

## 2017-12-18 NOTE — Progress Notes (Signed)
Jasmine Watson - 57 y.o. female MRN [id number]  Date of birth: February 13, 1961  Subjective Chief Complaint  Patient presents with  . Cough    HPI Jasmine Watson is a 57 y.o. female here today with complaint of cough.  Was seen last week for hoarseness and started on protonix for this.  Over the past couple of days she has developed increased cough with post nasal drainage and ear popping sensation.  Cough productive of yellowish mucus initially now clear.  She is taking protonix but hasn't noticed a difference in hoarseness yet.  She is also using flonase daily.  Cough is keeping her up at night.  She does feel like she has had  Some wheezing on occasion.  She denies fever, chills, shortness of breath, chest pain, headaches or sinus pain.   ROS:  ROS completed and negative except as noted per HPI.  Allergies  Allergen Reactions  . Shellfish Allergy Anaphylaxis    Past Medical History:  Diagnosis Date  . Childhood asthma   . History of chicken pox   . History of pulmonary embolism 10/2007  . 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 Lupus (HCC)    skin    Past Surgical History:  Procedure Laterality Date  . TOTAL ABDOMINAL HYSTERECTOMY W/ BILATERAL SALPINGOOPHORECTOMY     secondary to fibroids    Social History   Socioeconomic History  . Marital status: Married    Spouse name: Not on file  . Number of children: Not on file  . Years of education: Not on file  . Highest education level: Not on file  Occupational History  . 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 Financial resource strain: Not on file  . Food insecurity:    Worry: Not on file    Inability: Not on file  . Transportation needs:    Medical: Not on file    Non-medical: Not on file  Tobacco Use  . Smoking status: Never Smoker  . Smokeless tobacco: Never Used  Substance and Sexual Activity  . Alcohol use: Yes    Comment: occasional  . Drug use: No  . Sexual  activity: Not on file  Lifestyle  . Physical activity:    Days per week: Not on file    Minutes per session: Not on file  . Stress: Not on file  Relationships  . Social connections:    Talks on phone: Not on file    Gets together: Not on file    Attends religious service: Not on file    Active member of club or organization: Not on file    Attends meetings of clubs or organizations: Not on file    Relationship status: Not on file  Other Topics Concern  . Not on file  Social History Narrative   HSG, 1 semester college   Married '92   1 son - '93, 1 daughter - '98          Family History  Problem Relation Age of Onset  . Prostate cancer Father        Survivor  . Diabetes Father   . Hypertension Father   . Cancer Brother        Renal cell carcinoma  . Coronary artery disease Other   . Colon cancer Neg Hx   . Breast cancer Neg Hx   . Stomach cancer Neg Hx     Health Maintenance  Topic Date  Due  . Hepatitis C Screening  [birth year]  . HIV Screening  06/06/1976  . TETANUS/TDAP  06/06/1980  . MAMMOGRAM  06/07/2011  . PAP SMEAR  10/26/2017  . INFLUENZA VACCINE  03/20/2018  . COLONOSCOPY  09/05/2022    ----------------------------------------------------------------------------------------------------------------------------------------------------------------------------------------------------------------- Physical Exam BP 120/70 (BP Location: Left Arm, Cuff Size: Small)   Pulse 76   Temp 98.1 F (36.7 C) (Oral)   Physical Exam  Constitutional: She is oriented to person, place, and time. She appears well-nourished. No distress.  HENT:  Head: Normocephalic and atraumatic.  Mouth/Throat: No oropharyngeal exudate.  Uvular edema with mild OP erythema.   R turbinates are edematous.   Eyes: Conjunctivae are normal. No scleral icterus.  Neck: Neck supple. No thyromegaly present.  Cardiovascular: Normal rate, regular rhythm and normal heart sounds.  Pulmonary/Chest:  Effort normal and breath sounds normal. No respiratory distress. She has no wheezes.  Lymphadenopathy:    She has no cervical adenopathy.  Neurological: She is alert and oriented to person, place, and time.  Skin: No rash noted.  Psychiatric: She has a normal mood and affect. Her behavior is normal.    ------------------------------------------------------------------------------------------------------------------------------------------------------------------------------------------------------------------- Assessment and Plan  Cough She does have a history of PE however Wells Score of 1.5 makes this less likely as she has other signs that are consistent with viral vs allergic etiology. Recommend pushing fluids Will add on Robitussin AC cough syrup as promethazine DM is on national backorder.  Continue flonase Call if not improving as expected or for continued worsening of symptoms.

## 2017-12-18 NOTE — Patient Instructions (Signed)
Push fluids Continue flonase daily Continue protonix Cough syrup as directed on an as needed basis Call if not improving over the next few days.

## 2017-12-18 NOTE — Assessment & Plan Note (Signed)
She does have a history of PE however Wells Score of 1.5 makes this less likely as she has other signs that are consistent with viral vs allergic etiology. Recommend pushing fluids Will add on Robitussin AC cough syrup as promethazine DM is on national backorder.  Continue flonase Call if not improving as expected or for continued worsening of symptoms.

## 2018-01-27 ENCOUNTER — Ambulatory Visit: Payer: PRIVATE HEALTH INSURANCE | Admitting: Family Medicine

## 2018-02-05 ENCOUNTER — Ambulatory Visit: Payer: PRIVATE HEALTH INSURANCE | Admitting: Family Medicine

## 2018-07-01 ENCOUNTER — Other Ambulatory Visit: Payer: Self-pay | Admitting: Family Medicine

## 2018-07-01 NOTE — Telephone Encounter (Signed)
Requested medication (s) are due for refill today: Yes  Requested medication (s) are on the active medication list: Yes  Last refill:  12/12/17  Future visit scheduled: No  Notes to clinic:  Noted to return for follow up, possible ENT referral, unsure if provider wants patient to be seen in the office or refill.      Requested Prescriptions  Pending Prescriptions Disp Refills   pantoprazole (PROTONIX) 40 MG tablet 30 tablet 3    Sig: Take 1 tablet (40 mg total) by mouth daily.     Gastroenterology: Proton Pump Inhibitors Passed - 07/01/2018  4:43 PM      Passed - Valid encounter within last 12 months    Recent Outpatient Visits          6 months ago Cough   [facility] [place], [place], DO   6 months ago Hoarseness   [facility] [place], [place]   5 years ago Allergy to contactant, sequela   [facility] [place], Rosalyn GessMichael E, MD   5 years ago Numbness in Social workerfeet   [facility] [place], Salvadore Oxfordegina W, NP   7 years ago SHOULDER PAIN, RIGHT   [facility] [place] [place], Rosalyn GessMichael E, MD

## 2018-07-01 NOTE — Telephone Encounter (Signed)
Copied from CRM [id number]. Topic: Quick Communication - Rx Refill/Question >> Jul 01, 2018  4:27 PM Lynne Logan D wrote: Medication: pantoprazole (PROTONIX) 40 MG tablet   Has the patient contacted their pharmacy? No. (Agent: If no, request that the patient contact the pharmacy for the refill.) (Agent: If yes, when and what did the pharmacy advise?)  Preferred Pharmacy (with phone number or street name): [id number] - [place], [place] - [address] [phone] (Phone) [phone] (Fax)    Agent: Please be advised that RX refills may take up to 3 business days. We ask that you follow-up with your pharmacy.

## 2018-07-01 NOTE — Telephone Encounter (Signed)
Patient called, left VM to return call to the office to discuss refill request. Noted when medication was ordered to return in 6 weeks to evaluate hoarseness. No show and canceled 2 appointments.

## 2018-07-02 MED ORDER — PANTOPRAZOLE SODIUM 40 MG PO TBEC: 40.0000 mg | DELAYED_RELEASE_TABLET | Freq: Every day | ORAL | 3 refills | Status: DC

## 2018-12-15 ENCOUNTER — Other Ambulatory Visit: Payer: Self-pay | Admitting: Family Medicine

## 2019-01-06 ENCOUNTER — Other Ambulatory Visit: Payer: Self-pay

## 2019-01-06 ENCOUNTER — Ambulatory Visit (HOSPITAL_COMMUNITY)
Admission: EM | Admit: 2019-01-06 | Discharge: 2019-01-06 | Disposition: A | Discharge status: Home / Self Care | Payer: No Typology Code available for payment source | Attending: Family Medicine | Admitting: Family Medicine

## 2019-01-06 ENCOUNTER — Encounter (HOSPITAL_COMMUNITY): Payer: Self-pay | Admitting: Emergency Medicine

## 2019-01-06 DIAGNOSIS — H1032 Unspecified acute conjunctivitis, left eye: Secondary | ICD-10-CM

## 2019-01-06 MED ORDER — TOBRAMYCIN 0.3 % OP SOLN: 1.0000 [drp] | OPHTHALMIC | 0 refills | Status: DC

## 2019-01-06 NOTE — ED Triage Notes (Signed)
Pt sts left eye pain with drainage x 2 days

## 2019-01-06 NOTE — ED Provider Notes (Signed)
[facility]    CSN: [id number] Arrival date & time: 01/06/19  1155     History   Chief Complaint Chief Complaint  Patient presents with  . Eye Pain    HPI Jasmine Watson is a 58 y.o. female.   Pt sts left eye pain with drainage x 2 days.  Works in [employer].  No trauma hx.  Vision normal.     Past Medical History:  Diagnosis Date  . Childhood asthma   . History of chicken pox   . History of pulmonary embolism 10/2007  . 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 Lupus (HCC)    skin    Patient Active Problem List   Diagnosis Date Noted  . Cough 12/18/2017  . Screening for depression 12/12/2017  . Hoarseness 12/12/2017  . Chronic fatigue 12/12/2017  . Acute serous otitis media of left ear 10/07/2016  . 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 Encounter for long-term current use of medication 04/11/2016  . PULMONARY EMBOLISM 11/18/2007    Past Surgical History:  Procedure Laterality Date  . TOTAL ABDOMINAL HYSTERECTOMY W/ BILATERAL SALPINGOOPHORECTOMY     secondary to fibroids    OB History    Gravida  3   Para  2   Term      Preterm      AB  1   Living        SAB      TAB  1   Ectopic      Multiple      Live Births               Home Medications    Prior to Admission medications   Medication Sig Start Date End Date Taking? Authorizing Provider  azelaic acid (AZELEX) 20 % cream Azelex 20 % topical cream 10/15/17   [provider]  folic acid (FOLVITE) 1 MG tablet folic acid 1 mg tablet 10/15/17   [provider]  methotrexate (RHEUMATREX) 2.5 MG tablet methotrexate sodium 2.5 mg tablet 10/15/17   [provider]  pantoprazole (PROTONIX) 40 MG tablet TAKE 1 TABLET BY MOUTH EVERY DAY 12/15/18   Everrett CoombeMatthews, Cody, DO  tobramycin (TOBREX) 0.3 % ophthalmic solution Place 1 drop into the left eye every 4 (four) hours. 01/06/19   Elvina SidleLauenstein, Misaki Sozio, MD    Family History Family History  Problem Relation  Age of Onset  . Prostate cancer Father        Survivor  . Diabetes Father   . Hypertension Father   . Cancer Brother        Renal cell carcinoma  . Coronary artery disease Other   . Colon cancer Neg Hx   . Breast cancer Neg Hx   . Stomach cancer Neg Hx     Social History Social History   Tobacco Use  . Smoking status: Never Smoker  . Smokeless tobacco: Never Used  Substance Use Topics  . Alcohol use: Yes    Comment: occasional  . Drug use: No     Allergies   Shellfish allergy   Review of Systems Review of Systems   Physical Exam Triage Vital Signs ED Triage Vitals  Enc Vitals Group     BP 01/06/19 1227 (!) 151/98     Pulse Rate 01/06/19 1227 80     Resp 01/06/19 1227 18     Temp 01/06/19 1227 98.1 F (36.7 C)     Temp Source 01/06/19 1227 Oral  SpO2 01/06/19 1227 98 %     Weight --      Height --      Head Circumference --      Peak Flow --      Pain Score 01/06/19 1228 5     Pain Loc --      Pain Edu? --      Excl. in GC? --    No data found.  Updated Vital Signs BP (!) 151/98 (BP Location: Right Arm)   Pulse 80   Temp 98.1 F (36.7 C) (Oral)   Resp 18   SpO2 98%   Physical Exam Vitals signs and nursing note reviewed.  Constitutional:      Appearance: Normal appearance.  HENT:     Head: Normocephalic.     Mouth/Throat:     Pharynx: Oropharynx is clear.  Eyes:     General:        Left eye: Discharge present.    Pupils: Pupils are equal, round, and reactive to light.     Comments: Watery swollen left lateral conjunctiva with erythematous sclera.  Pulmonary:     Effort: Pulmonary effort is normal.  Skin:    General: Skin is warm and dry.     Comments: Hyperpigmented left side of face  Neurological:     Mental Status: She is alert.      UC Treatments / Results  Labs (all labs ordered are listed, but only abnormal results are displayed) Labs Reviewed - No data to display  EKG None  Radiology No results found.   Procedures Procedures (including critical care time)  Medications Ordered in UC Medications - No data to display  Initial Impression / Assessment and Plan / UC Course  I have reviewed the triage vital signs and the nursing notes.  Pertinent labs & imaging results that were available during my care of the patient were reviewed by me and considered in my medical decision making (see chart for details).    Final Clinical Impressions(s) / UC Diagnoses   Final diagnoses:  Acute bacterial conjunctivitis of left eye   Discharge Instructions   None    ED Prescriptions    Medication Sig Dispense Auth. Provider   tobramycin (TOBREX) 0.3 % ophthalmic solution Place 1 drop into the left eye every 4 (four) hours. 5 mL Elvina Sidle, MD     Controlled Substance Prescriptions [place] Controlled Substance Registry consulted? Not Applicable   Elvina Sidle, MD 01/06/19 1240

## 2019-04-07 ENCOUNTER — Encounter: Payer: Self-pay | Admitting: Gastroenterology

## 2019-04-07 ENCOUNTER — Ambulatory Visit (INDEPENDENT_AMBULATORY_CARE_PROVIDER_SITE_OTHER): Payer: No Typology Code available for payment source | Admitting: Gastroenterology

## 2019-04-07 ENCOUNTER — Other Ambulatory Visit (INDEPENDENT_AMBULATORY_CARE_PROVIDER_SITE_OTHER): Payer: No Typology Code available for payment source

## 2019-04-07 ENCOUNTER — Other Ambulatory Visit: Payer: Self-pay

## 2019-04-07 VITALS — BP 140/100 | HR 80 | Temp 98.4°F | Ht 63.25 in | Wt 186.5 lb

## 2019-04-07 DIAGNOSIS — R945 Abnormal results of liver function studies: Secondary | ICD-10-CM

## 2019-04-07 DIAGNOSIS — R7989 Other specified abnormal findings of blood chemistry: Secondary | ICD-10-CM

## 2019-04-07 LAB — IRON: Iron: 97 ug/dL (ref 42–145)

## 2019-04-07 LAB — IBC + FERRITIN
Ferritin: 217.3 ng/mL (ref 10.0–291.0)
Iron: 97 ug/dL (ref 42–145)
Saturation Ratios: 27.8 % (ref 20.0–50.0)
Transferrin: 249 mg/dL (ref 212.0–360.0)

## 2019-04-07 LAB — COMPREHENSIVE METABOLIC PANEL
ALT: 39 U/L — ABNORMAL HIGH (ref 0–35)
AST: 21 U/L (ref 0–37)
Albumin: 3.9 g/dL (ref 3.5–5.2)
Alkaline Phosphatase: 56 U/L (ref 39–117)
BUN: 20 mg/dL (ref 6–23)
CO2: 27 mEq/L (ref 19–32)
Calcium: 9 mg/dL (ref 8.4–10.5)
Chloride: 104 mEq/L (ref 96–112)
Creatinine, Ser: 0.88 mg/dL (ref 0.40–1.20)
GFR: 79.9 mL/min (ref >60.00)
Glucose, Bld: 92 mg/dL (ref 70–99)
Potassium: 3.6 mEq/L (ref 3.5–5.1)
Sodium: 138 mEq/L (ref 135–145)
Total Bilirubin: 0.9 mg/dL (ref 0.2–1.2)
Total Protein: 7.3 g/dL (ref 6.0–8.3)

## 2019-04-07 LAB — CBC WITH DIFFERENTIAL/PLATELET
Basophils Absolute: 0.1 10*3/uL (ref 0.0–0.1)
Basophils Relative: 1.3 % (ref 0.0–3.0)
Eosinophils Absolute: 0.1 10*3/uL (ref 0.0–0.7)
Eosinophils Relative: 1.3 % (ref 0.0–5.0)
HCT: 39.5 % (ref 36.0–46.0)
Hemoglobin: 13.1 g/dL (ref 12.0–15.0)
Lymphocytes Relative: 35.3 % (ref 12.0–46.0)
Lymphs Abs: 1.6 10*3/uL (ref 0.7–4.0)
MCHC: 33.1 g/dL (ref 30.0–36.0)
MCV: 88.3 fl (ref 78.0–100.0)
Monocytes Absolute: 0.4 10*3/uL (ref 0.1–1.0)
Monocytes Relative: 9.4 % (ref 3.0–12.0)
Neutro Abs: 2.4 10*3/uL (ref 1.4–7.7)
Neutrophils Relative %: 52.7 % (ref 43.0–77.0)
Platelets: 190 10*3/uL (ref 150.0–400.0)
RBC: 4.48 Mil/uL (ref 3.87–5.11)
RDW: 14.5 % (ref 11.5–15.5)
WBC: 4.5 10*3/uL (ref 4.0–10.5)

## 2019-04-07 LAB — PROTIME-INR
INR: 1.1 ratio — ABNORMAL HIGH (ref 0.8–1.0)
Prothrombin Time: 13 s (ref 9.6–13.1)

## 2019-04-07 LAB — IGA: IgA: 357 mg/dL (ref 68–378)

## 2019-04-07 NOTE — Progress Notes (Signed)
HPI: This is a very pleasant 58 year old woman who was referred to me by Marcelle OverlieGrewal, Michelle, MD  to evaluate elevated liver tests.    Chief complaint is elevated liver tests  I did a colonoscopy for her January 2014 and it was completely normal.  I recommended she have repeat colon cancer screening with a colonoscopy at 10-year interval  Old Data Reviewed: Labs May 2020 show ALT 55; liver function tests otherwise normal Labs July 2020 show AST 42, ALT 55; liver function tests otherwise normal  She drinks alcohol daily, vodka at least 4 shots worth every day.  She has been doing this for quite a while.  She knows that she should try to cut back.  She has never had liver test abnormalities prior to this that she is aware of.  She has never had hepatitis or jaundice.  Liver disease does not run in her family  She is on  methotrexate for dermatologic lupus.    Review of systems: Pertinent positive and negative review of systems were noted in the above HPI section. All other review negative.   Past Medical History:  Diagnosis Date  . Childhood asthma   . History of chicken pox   . History of pulmonary embolism 10/2007  . 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 Lupus (HCC)    skin    Past Surgical History:  Procedure Laterality Date  . TOTAL ABDOMINAL HYSTERECTOMY W/ BILATERAL SALPINGOOPHORECTOMY     secondary to fibroids    Current Outpatient Medications  Medication Sig Dispense Refill  . folic acid (FOLVITE) 1 MG tablet Take 1 mg by mouth daily.     . methotrexate (RHEUMATREX) 2.5 MG tablet Take 2.5 mg by mouth once a week.     . pantoprazole (PROTONIX) 40 MG tablet TAKE 1 TABLET BY MOUTH EVERY DAY 30 tablet 3   No current facility-administered medications for this visit.     Allergies as of 04/07/2019 - Review Complete 04/07/2019  Allergen Reaction Noted  . Shellfish allergy Anaphylaxis 02/23/2013    Family History  Problem Relation Age of Onset  . Prostate cancer  Father        Survivor  . Diabetes Father   . Hypertension Father   . Cancer Brother        Renal cell carcinoma  . Coronary artery disease Other   . Colon cancer Neg Hx   . Breast cancer Neg Hx   . Stomach cancer Neg Hx     Social History   Socioeconomic History  . Marital status: Married    Spouse name: Not on file  . Number of children: Not on file  . Years of education: Not on file  . Highest education level: Not on file  Occupational History  . 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 Financial resource strain: Not on file  . Food insecurity    Worry: Not on file    Inability: Not on file  . Transportation needs    Medical: Not on file    Non-medical: Not on file  Tobacco Use  . Smoking status: Never Smoker  . Smokeless tobacco: Never Used  Substance and Sexual Activity  . Alcohol use: Yes    Comment: occasional  . Drug use: No  . Sexual activity: Not on file  Lifestyle  . Physical activity    Days per week: Not on file    Minutes per session: Not on  file  . Stress: Not on file  Relationships  . Social Herbalist on phone: Not on file    Gets together: Not on file    Attends religious service: Not on file    Active member of club or organization: Not on file    Attends meetings of clubs or organizations: Not on file    Relationship status: Not on file  . Intimate partner violence    Fear of current or ex partner: Not on file    Emotionally abused: Not on file    Physically abused: Not on file    Forced sexual activity: Not on file  Other Topics Concern  . Not on file  Social History Narrative   HS[employer], 1 semester college   Married '92   1 son - '93, 1 daughter - '98          Physical Exam: Temp 98.4 F (36.9 C)   Ht 5' 3.25" (1.607 m) Comment: height measured without shoes  Wt 186 lb 8 oz (84.6 kg)   BMI 32.78 kg/m  Constitutional: generally well-appearing Psychiatric: alert and  oriented x3 Eyes: extraocular movements intact Mouth: oral pharynx moist, no lesions Neck: supple no lymphadenopathy Cardiovascular: heart regular rate and rhythm Lungs: clear to auscultation bilaterally Abdomen: soft, nontender, nondistended, no obvious ascites, no peritoneal signs, normal bowel sounds Extremities: no lower extremity edema bilaterally Skin: no lesions on visible extremities   Assessment and plan: 58 y.o. female with slightly elevated liver tests  Very mild elevation of both AST and ALT.  Her other liver tests are all normal.  Certainly she drinks too much alcohol and that might contribute.  Once weekly methotrexate may complicate this as well.  I recommended a battery of blood tests to check for other potential causes of her elevated liver tests such as viral hepatitis, autoimmune hepatitis.  See the full list in patient instructions.  She also needs an abdominal ultrasound to check for cirrhosis, biliary causes which I think are unlikely.   Please see the "Patient Instructions" section for addition details about the plan.   Owens Loffler, MD [facility] 04/07/2019, 3:27 PM  Cc: Dian Queen, MD

## 2019-04-07 NOTE — Patient Instructions (Addendum)
You will get labs drawn today:  Hepatitis A (IgM and IgG), Hepatitis B surface antigen, Hepatitis B surface antibody, Hepatitis C antibody, total iron, ferritin, TIBC, ANA, AMA, anti smooth muscle antibody, alpha 1 antitrypsin, cerulloplasm, TTG, total IgA level, CBC, CMET, INR.   You will be set up for an ultrasound for elevated liver tests.   You should cut back on your daily alcohol consumption.  You have been scheduled for an abdominal ultrasound at [facility] (1st floor of hospital) on 04/14/19 at 9am. Please arrive 15 minutes prior to your appointment for registration. Make certain not to have anything to eat or drink 6 hours prior to your appointment. Should you need to reschedule your appointment, please contact radiology at [phone]. This test typically takes about 30 minutes to perform.  Thank you for entrusting me with your care and choosing [facility].  Dr Ardis Hughs

## 2019-04-10 LAB — TISSUE TRANSGLUTAMINASE, IGA: (tTG) Ab, IgA: 1 U/mL

## 2019-04-10 LAB — HEPATITIS B SURFACE ANTIGEN: Hepatitis B Surface Ag: NONREACTIVE

## 2019-04-10 LAB — ANA: Anti Nuclear Antibody (ANA): POSITIVE — AB

## 2019-04-10 LAB — CERULOPLASMIN: Ceruloplasmin: 30 mg/dL (ref 18–53)

## 2019-04-10 LAB — ANTI-SMOOTH MUSCLE ANTIBODY, IGG: Actin (Smooth Muscle) Antibody (IGG): <20 U (ref <20)

## 2019-04-10 LAB — HEPATITIS C ANTIBODY
Hepatitis C Ab: NONREACTIVE
SIGNAL TO CUT-OFF: 0.02 (ref <1.00)

## 2019-04-10 LAB — MITOCHONDRIAL ANTIBODIES: Mitochondrial M2 Ab, IgG: <=20 U

## 2019-04-10 LAB — HEPATITIS B CORE ANTIBODY, TOTAL: Hep B Core Total Ab: NONREACTIVE

## 2019-04-10 LAB — ALPHA-1-ANTITRYPSIN: A-1 Antitrypsin, Ser: 119 mg/dL (ref 83–199)

## 2019-04-10 LAB — ANTI-NUCLEAR AB-TITER (ANA TITER): ANA Titer 1: 1:1280 {titer} — ABNORMAL HIGH

## 2019-04-10 LAB — HEPATITIS A ANTIBODY, TOTAL: Hepatitis A AB,Total: NONREACTIVE

## 2019-04-13 ENCOUNTER — Ambulatory Visit (HOSPITAL_COMMUNITY)
Admission: RE | Admit: 2019-04-13 | Discharge: 2019-04-13 | Disposition: A | Discharge status: Home / Self Care | Payer: PRIVATE HEALTH INSURANCE | Source: Ambulatory Visit | Attending: Gastroenterology | Admitting: Gastroenterology

## 2019-04-13 ENCOUNTER — Other Ambulatory Visit: Payer: Self-pay

## 2019-04-13 DIAGNOSIS — R945 Abnormal results of liver function studies: Secondary | ICD-10-CM | POA: Insufficient documentation

## 2019-04-13 DIAGNOSIS — R7989 Other specified abnormal findings of blood chemistry: Secondary | ICD-10-CM

## 2019-04-13 IMAGING — US ULTRASOUND ABDOMEN COMPLETE
1 series · 13 of 25 positions shown · non-contrast
Comparison: None.

CLINICAL DATA: Abnormal LFTs

EXAM:
ABDOMEN ULTRASOUND COMPLETE

[Series 1: ultrasound abdomen complete · 13 of 140 slices shown]
[im 1/140]
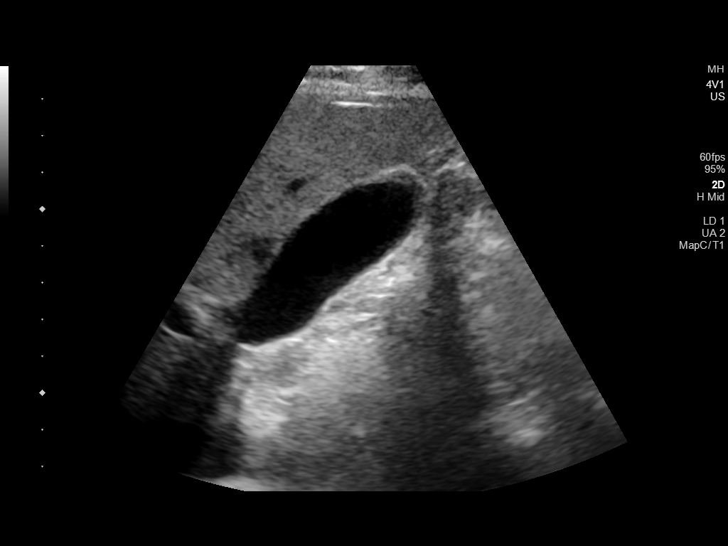
[im 12/140]
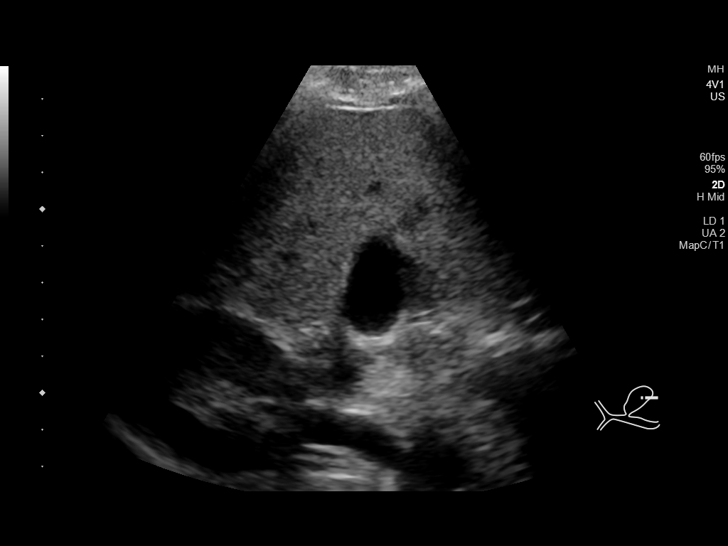
[im 24/140]
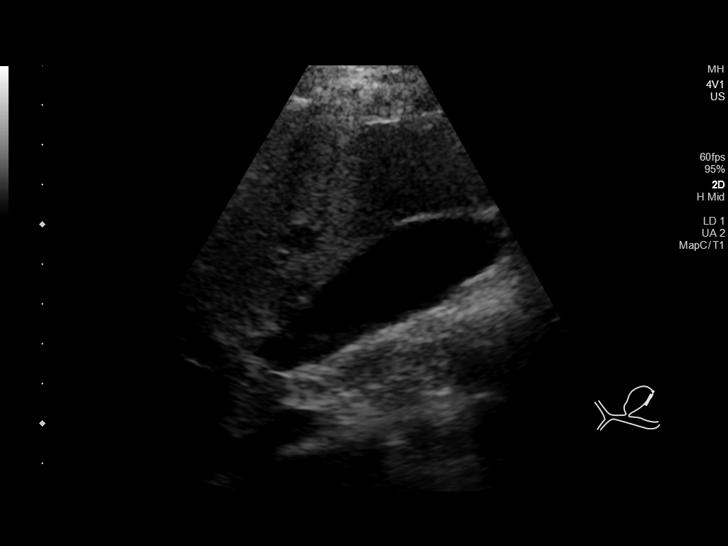
[im 35/140]
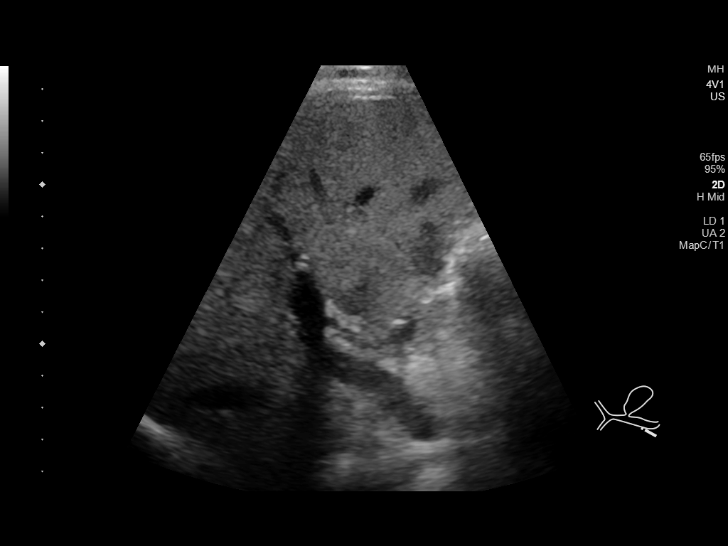
[im 47/140]
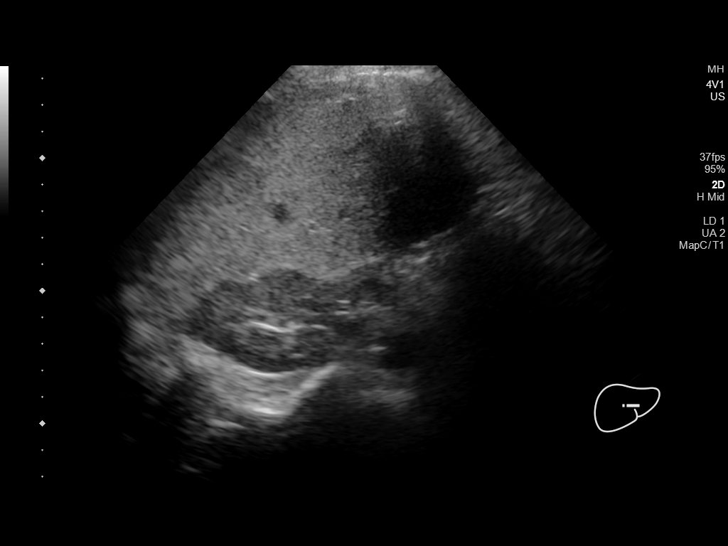
[im 58/140]
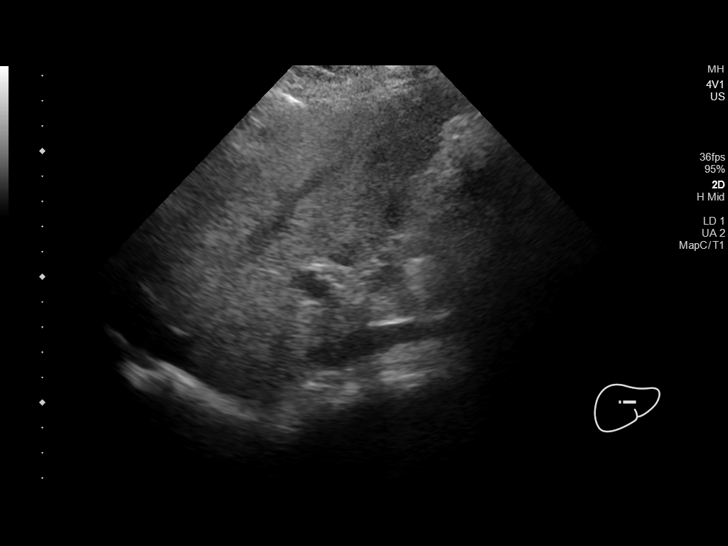
[im 70/140]
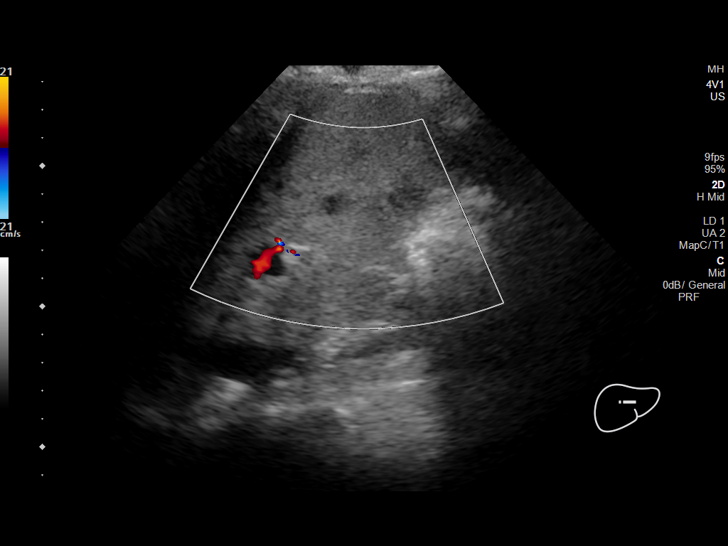
[im 82/140]
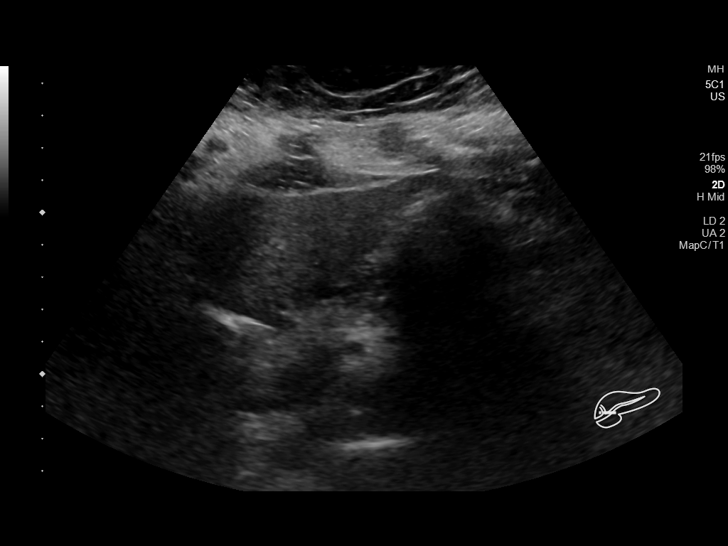
[im 93/140]
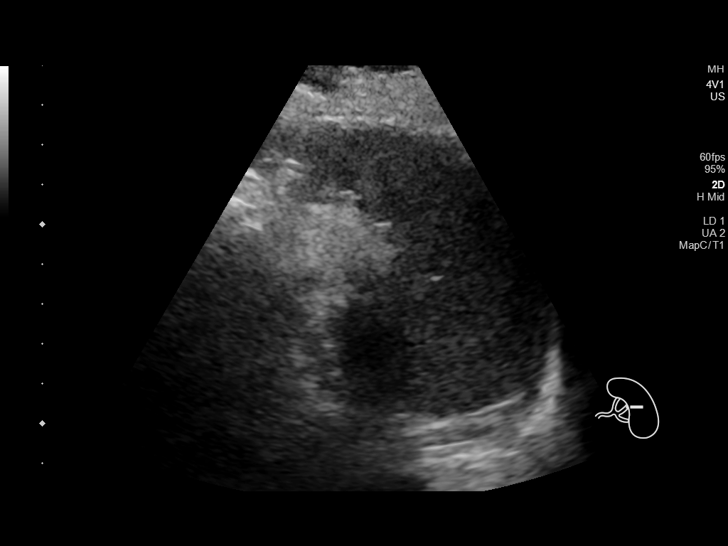
[im 105/140]
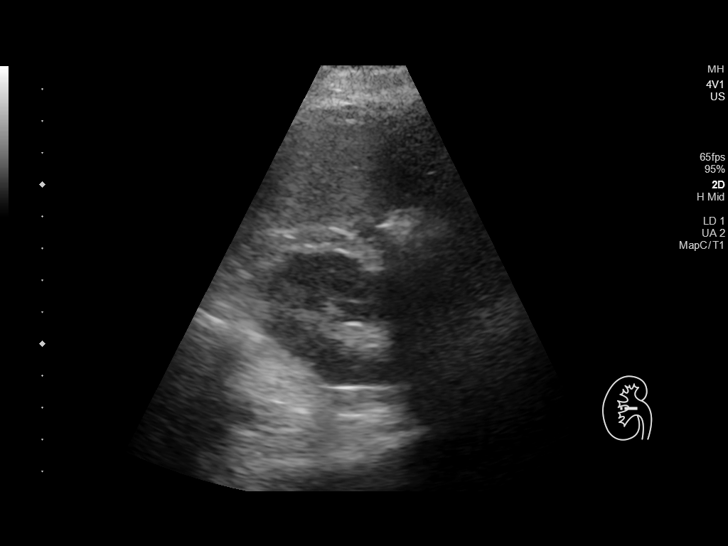
[im 116/140]
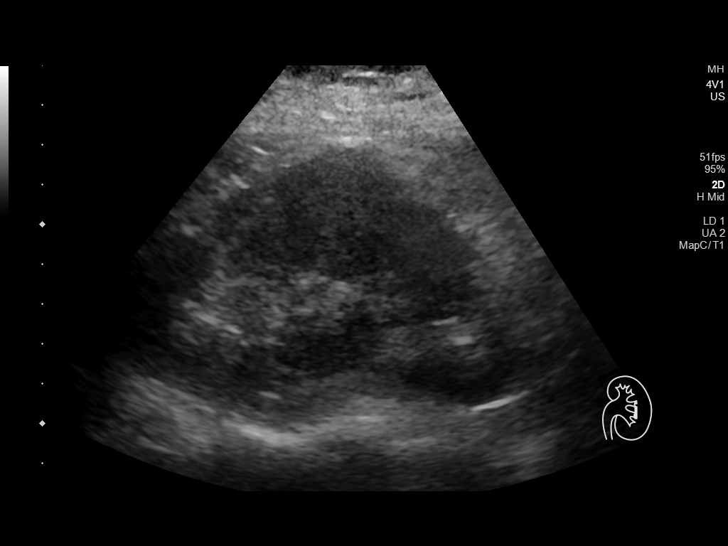
[im 128/140]
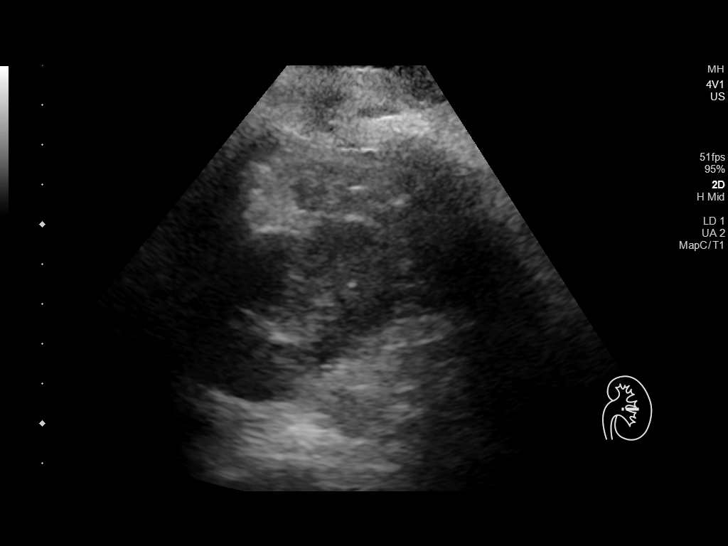
[im 140/140]
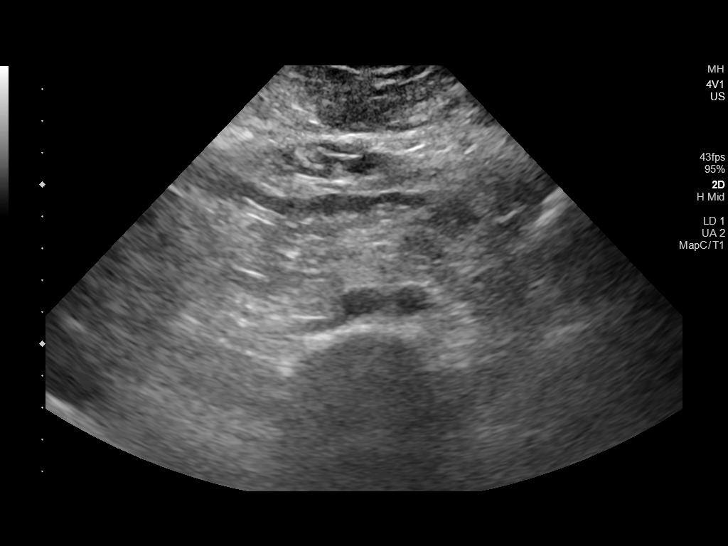

[13 of 25 positions shown; findings below may reference images not displayed]

FINDINGS: Gallbladder: No gallstones or wall thickening visualized. No
sonographic Murphy sign noted by sonographer.

Common bile duct: Diameter: Normal caliber, 4 mm

Liver: Increased echotexture compatible with fatty infiltration. No
focal abnormality or biliary ductal dilatation. Hypoechoic area
adjacent to the gallbladder fossa likely focal fatty sparing. Portal
vein is patent on color Doppler imaging with normal direction of
blood flow towards the liver.

IVC: No abnormality visualized.

Pancreas: Visualized portion unremarkable.

Spleen: Size and appearance within normal limits.

Right Kidney: Length: 11.7 cm. Echogenicity within normal limits. No
mass or hydronephrosis visualized.

Left Kidney: Length: 11.3 cm. Solid masslike area noted in the
midpole of the left kidney measuring up to 3.6 cm. No
hydronephrosis.

Abdominal aorta: No aneurysm visualized.

Other findings: None.
IMPRESSION: Diffuse fatty infiltration of the liver masslike area in the midpole
left kidney.

Solid measures up to 3.6 cm. Cannot exclude neoplasm/renal cell
carcinoma. Recommend further evaluation with renal protocol MRI or
CT.

## 2019-04-14 ENCOUNTER — Ambulatory Visit (HOSPITAL_COMMUNITY): Payer: No Typology Code available for payment source

## 2019-04-17 ENCOUNTER — Other Ambulatory Visit: Payer: Self-pay | Admitting: Gastroenterology

## 2019-04-17 DIAGNOSIS — R9389 Abnormal findings on diagnostic imaging of other specified body structures: Secondary | ICD-10-CM

## 2019-04-17 DIAGNOSIS — N2889 Other specified disorders of kidney and ureter: Secondary | ICD-10-CM

## 2019-04-23 ENCOUNTER — Other Ambulatory Visit: Payer: Self-pay | Admitting: Family Medicine

## 2019-05-08 ENCOUNTER — Ambulatory Visit
Admission: RE | Admit: 2019-05-08 | Discharge: 2019-05-08 | Disposition: A | Discharge status: Home / Self Care | Payer: No Typology Code available for payment source | Source: Ambulatory Visit | Attending: Gastroenterology | Admitting: Gastroenterology

## 2019-05-08 DIAGNOSIS — N2889 Other specified disorders of kidney and ureter: Secondary | ICD-10-CM

## 2019-05-08 DIAGNOSIS — R9389 Abnormal findings on diagnostic imaging of other specified body structures: Secondary | ICD-10-CM

## 2019-05-08 IMAGING — MR MR ABDOMEN WO/W CM
17 series · 47 of 48 positions shown · IV contrast (multihance)
Comparison: Abdominal ultrasound dated [DATE]

CLINICAL DATA: Left renal lesion on ultrasound

EXAM:
MRI ABDOMEN WITHOUT AND WITH CONTRAST
TECHNIQUE: Multiplanar multisequence MR imaging of the abdomen was performed
both before and after the administration of intravenous contrast.
CONTRAST:  17mL MULTIHANCE GADOBENATE DIMEGLUMINE 529 MG/ML IV SOLN

[Series 3: T2 · coronal · 5.0mm · 0.78mm/px · 2 of 18 slices shown (1 of 3)]
[im 1/18]
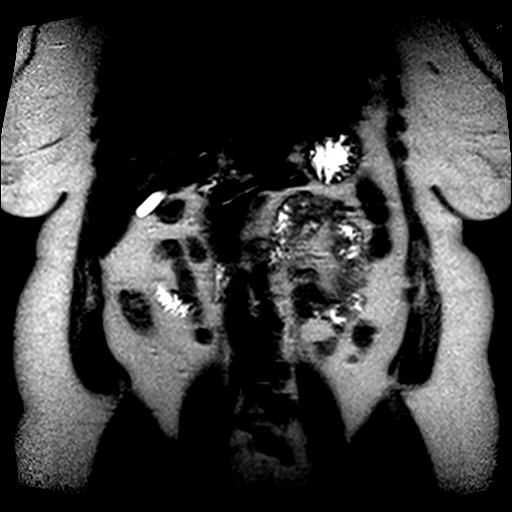
[im 18/18]
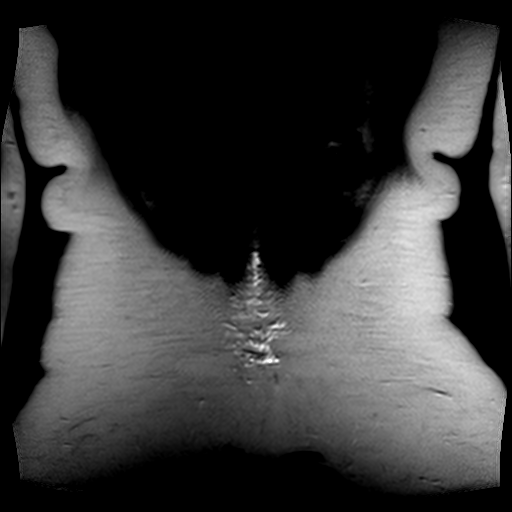

[Series 4: T2 · axial · 5.0mm · 0.66mm/px · z∈[-26,+106]mm · 2 of 23 slices shown (2 of 3)]
[im 1/23]
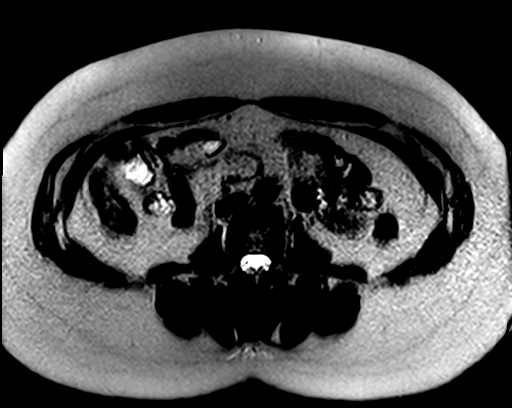
[im 23/23]
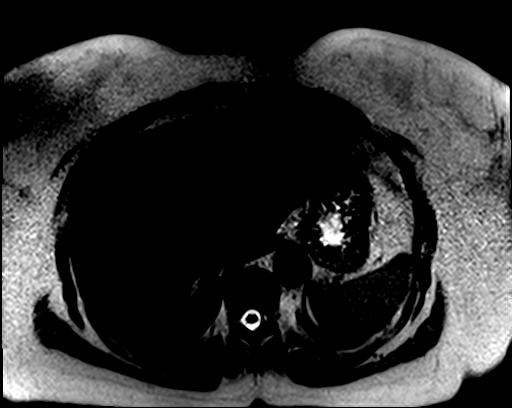

[Series 5: ep2d_diff_b50_500_800_p2_trig · axial · 6.0mm · 1.98mm/px · z∈[-36,+175]mm · 5 of 84 slices shown]
[im 1/84]
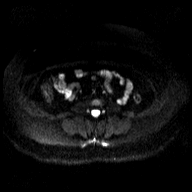
[im 21/84]
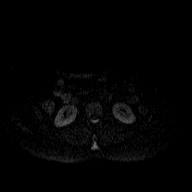
[im 42/84]
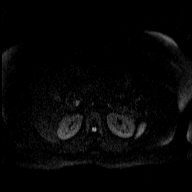
[im 63/84]
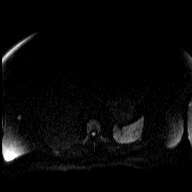
[im 84/84]
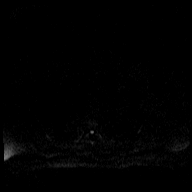

[Series 6: ep2d_diff_b50_500_800_p2_trig_adc · axial · 6.0mm · 1.98mm/px · z∈[-36,+175]mm · 2 of 28 slices shown]
[im 1/28]
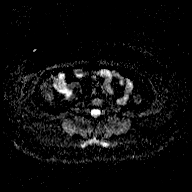
[im 28/28]
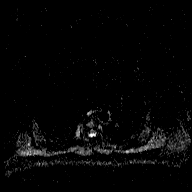

[Series 7: T2 · axial · 5.0mm · 1.33mm/px · z∈[-29,+175]mm · 2 of 35 slices shown (3 of 3)]
[im 1/35]
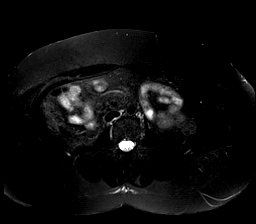
[im 35/35]
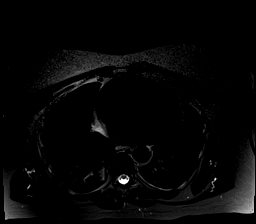

[Series 8: bSSFP · axial · 4.0mm · 0.74mm/px · z∈[-32,+116]mm · 2 of 38 slices shown]
[im 1/38]
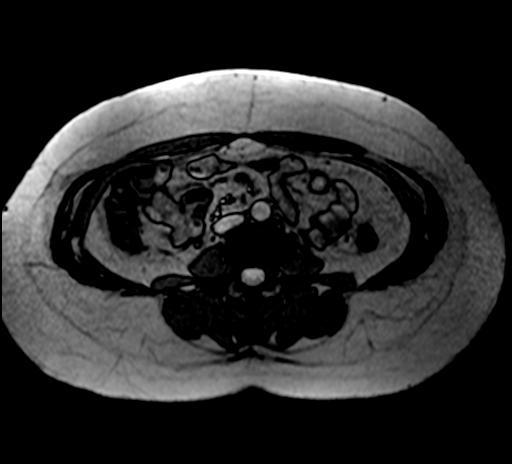
[im 38/38]
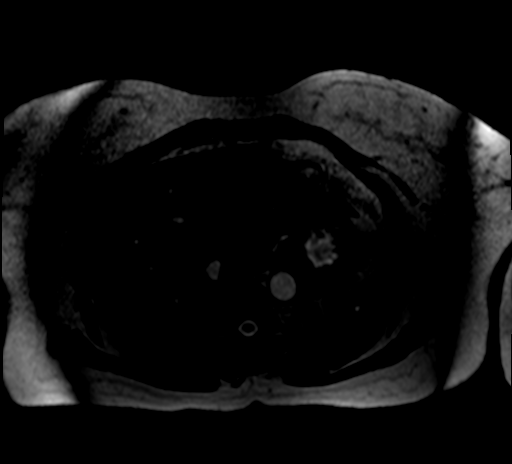

[Series 9: T1 · axial · 5.0mm · 0.70mm/px · z∈[-27,+111]mm · 3 of 48 slices shown]
[im 1/48]
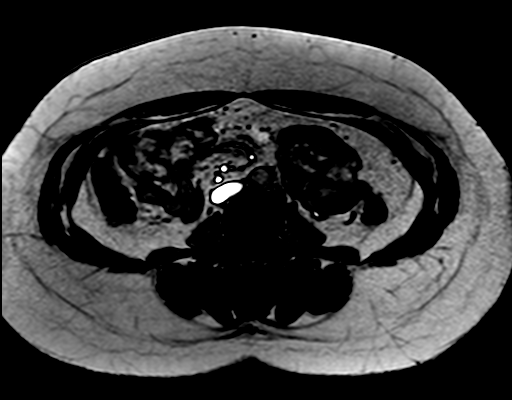
[im 24/48]
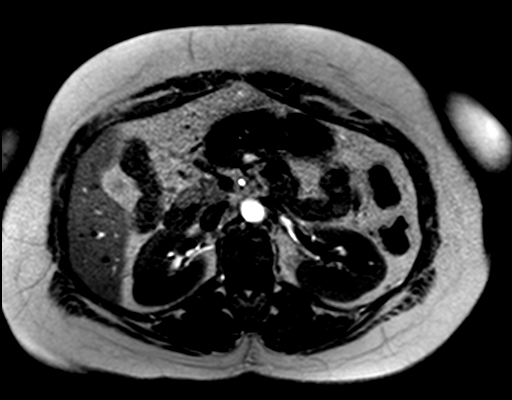
[im 48/48]
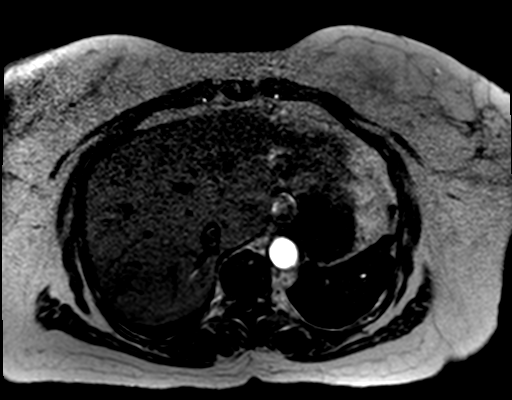

[Series 10: T1 dynamic · axial · non-contrast · 2.3mm · 1.48mm/px · z∈[-26,+110]mm · 3 of 60 slices shown]
[im 1/60]
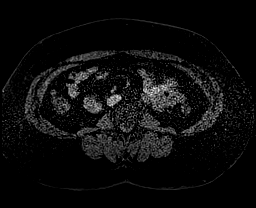
[im 30/60]
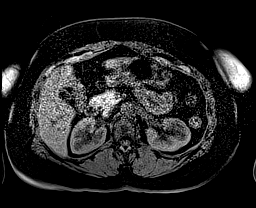
[im 60/60]
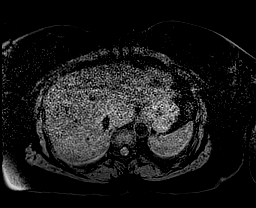

[Series 11: post 25 sec · axial · 2.3mm · 1.48mm/px · z∈[-26,+110]mm · 3 of 60 slices shown]
[im 1/60]
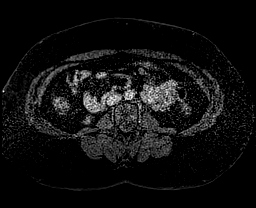
[im 30/60]
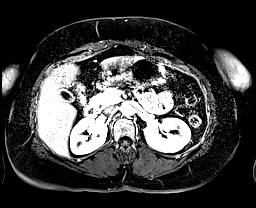
[im 60/60]
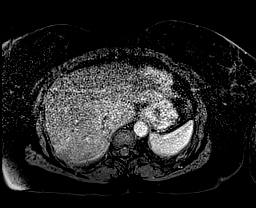

[Series 12: post 25 sec_sub · axial · 2.3mm · 1.48mm/px · z∈[-26,+110]mm · 3 of 60 slices shown]
[im 1/60]
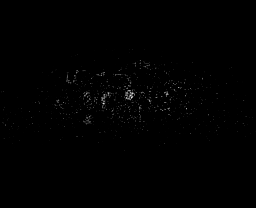
[im 30/60]
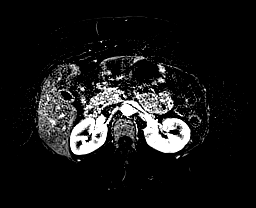
[im 60/60]
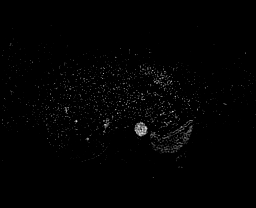

[Series 13: post 45 sec · axial · 2.3mm · 1.48mm/px · z∈[-26,+110]mm · 3 of 60 slices shown]
[im 1/60]
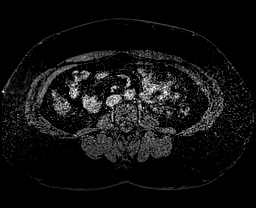
[im 30/60]
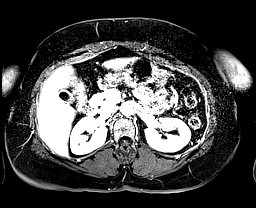
[im 60/60]
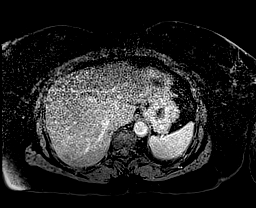

[Series 14: post 45 sec_sub · axial · 2.3mm · 1.48mm/px · z∈[-26,+110]mm · 3 of 60 slices shown]
[im 1/60]
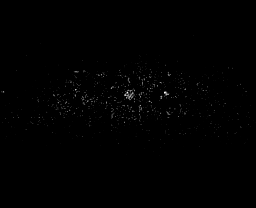
[im 30/60]
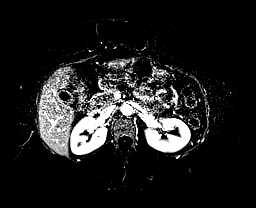
[im 60/60]
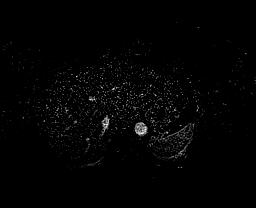

[Series 15: post 90 sec · axial · 2.3mm · 1.48mm/px · z∈[-26,+110]mm · 3 of 60 slices shown]
[im 1/60]
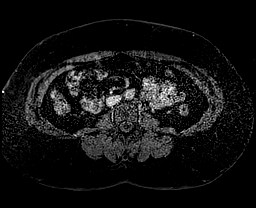
[im 30/60]
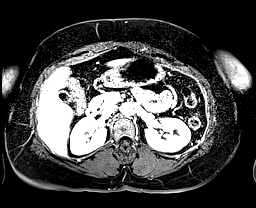
[im 60/60]
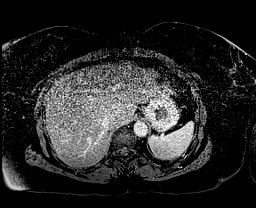

[Series 16: post 90 sec_sub · axial · 2.3mm · 1.48mm/px · z∈[-26,+110]mm · 3 of 60 slices shown]
[im 1/60]
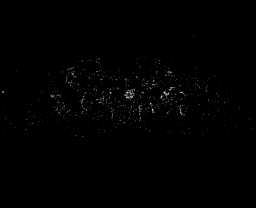
[im 30/60]
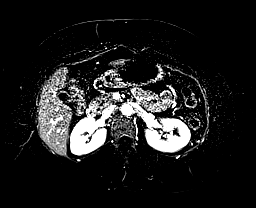
[im 60/60]
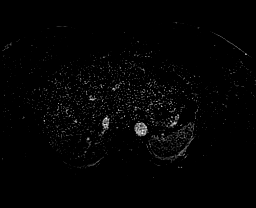

[Series 17: T1 dynamic post-contrast · coronal · 2.6mm · 0.78mm/px · 3 of 52 slices shown]
[im 1/52]
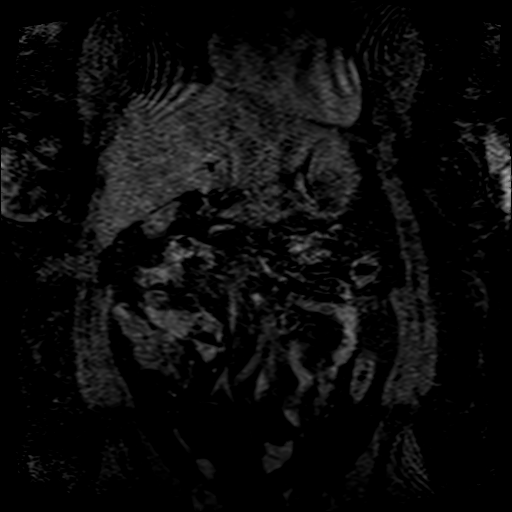
[im 26/52]
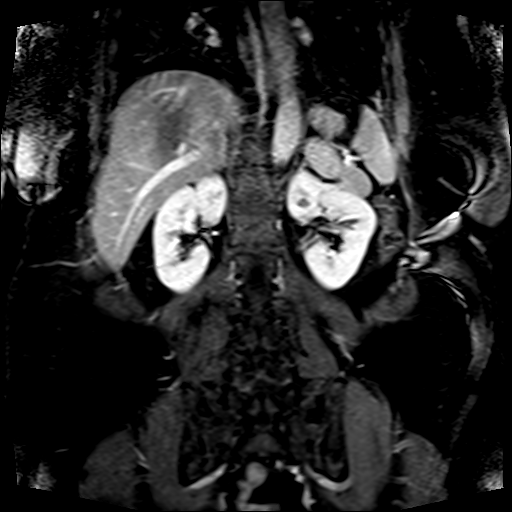
[im 52/52]
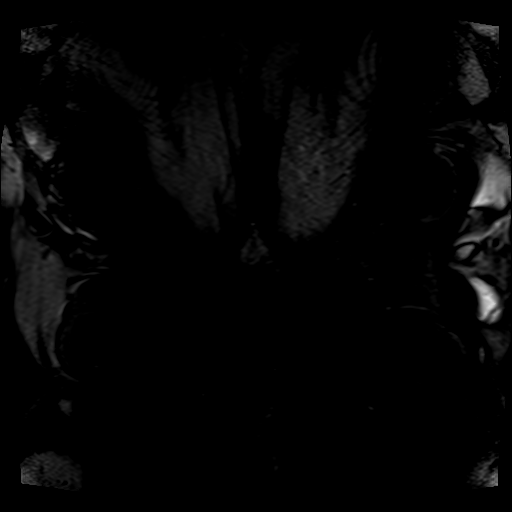

[Series 18: post axial 3+ · axial · 2.3mm · 1.48mm/px · z∈[-26,+110]mm · 3 of 60 slices shown (1 of 2)]
[im 1/60]
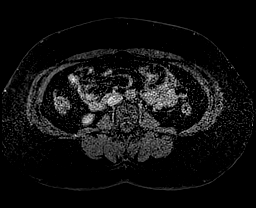
[im 30/60]
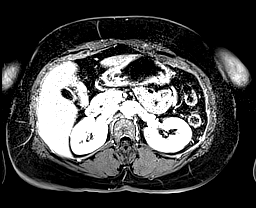
[im 60/60]
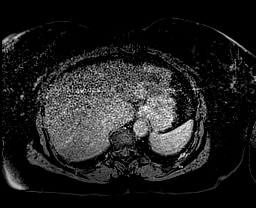

[Series 19: post axial 3+ · axial · 2.3mm · 1.48mm/px · z∈[-26,+41]mm · 2 of 60 slices shown (2 of 2)]
[im 1/60]
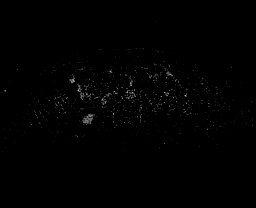
[im 30/60]
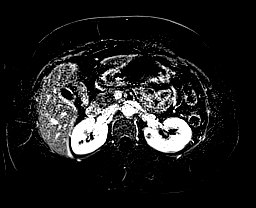

[47 of 48 positions shown; findings below may reference images not displayed]

FINDINGS: Lower chest: Lung bases are clear.

Hepatobiliary: Liver is within normal limits.

Gallbladder is unremarkable. No intrahepatic or extrahepatic ductal
dilatation.

Pancreas:  Within normal limits.

Spleen:  With normal limits.

Adrenals/Urinary Tract:  Adrenal glands are within normal limits.

1.7 x 1.3 cm complex cystic lesion in the medial left upper kidney
(series 7/image 21), mildly irregular with multiple thin septations
(series 5/image 24), but without solid component or measurable
enhancement. By definition, this is a Bosniak IIF lesion.

Additional 9 mm cyst in the right lower kidney (series 7/image 31),
benign (Bosniak I). No enhancing renal lesions.

Stomach/Bowel: Stomach is within normal limits.

Visualized bowel is unremarkable.

Vascular/Lymphatic:  No evidence of abdominal aortic aneurysm.

No suspicious abdominal lymphadenopathy.

Other:  No abdominal ascites.

Musculoskeletal: No focal osseous lesions.
IMPRESSION: 1.7 cm complex cystic lesion in the medial left upper kidney, as
described above (Bosniak IIF). Consider follow-up MRI abdomen
with/without contrast in 6 months.

9 mm simple cyst in the right lower kidney, benign (Bosniak I).

## 2019-05-08 MED ORDER — GADOBENATE DIMEGLUMINE 529 MG/ML IV SOLN
17.0000 mL | Freq: Once | INTRAVENOUS | Status: AC | PRN
  Administered 2019-05-08: 17 mL via INTRAVENOUS

## 2019-07-22 ENCOUNTER — Other Ambulatory Visit: Payer: Self-pay | Admitting: Family Medicine

## 2019-07-22 DIAGNOSIS — N281 Cyst of kidney, acquired: Secondary | ICD-10-CM

## 2019-07-22 NOTE — Progress Notes (Signed)
Please let patient know that her GI doctor forwarded her MRI results.  She has a cyst that we need to follow up.  She will need a repeat MRI in March to be sure this isn't changing.

## 2019-07-31 ENCOUNTER — Telehealth: Payer: Self-pay

## 2019-07-31 NOTE — Telephone Encounter (Signed)
Please see result note from previous MRI.  Thanks!

## 2019-07-31 NOTE — Telephone Encounter (Signed)
Spoke with patient I told her to schedule appt f/u MRI 6 months per MRI result doctor. Patient said she will call sometime next week to schedule appt.

## 2019-07-31 NOTE — Telephone Encounter (Signed)
Copied from [place] [id number]. Topic: General - Other >> Jul 30, 2019  1:59 PM Rainey Pines A wrote: Patient called and stated that she had an MRI don in September and is not going to get another done that's been ordered. Please advise

## 2019-09-22 ENCOUNTER — Other Ambulatory Visit: Payer: Self-pay | Admitting: Gastroenterology

## 2019-09-22 DIAGNOSIS — R945 Abnormal results of liver function studies: Secondary | ICD-10-CM

## 2019-09-22 DIAGNOSIS — R7989 Other specified abnormal findings of blood chemistry: Secondary | ICD-10-CM

## 2019-09-23 ENCOUNTER — Other Ambulatory Visit (INDEPENDENT_AMBULATORY_CARE_PROVIDER_SITE_OTHER): Payer: PRIVATE HEALTH INSURANCE

## 2019-09-23 DIAGNOSIS — R945 Abnormal results of liver function studies: Secondary | ICD-10-CM | POA: Diagnosis not present

## 2019-09-23 DIAGNOSIS — R7989 Other specified abnormal findings of blood chemistry: Secondary | ICD-10-CM

## 2019-09-23 LAB — HEPATIC FUNCTION PANEL
ALT: 50 U/L — ABNORMAL HIGH (ref 0–35)
AST: 30 U/L (ref 0–37)
Albumin: 4 g/dL (ref 3.5–5.2)
Alkaline Phosphatase: 63 U/L (ref 39–117)
Bilirubin, Direct: 0.2 mg/dL (ref 0.0–0.3)
Total Bilirubin: 1 mg/dL (ref 0.2–1.2)
Total Protein: 7.6 g/dL (ref 6.0–8.3)

## 2019-09-28 ENCOUNTER — Other Ambulatory Visit: Payer: Self-pay

## 2019-09-28 DIAGNOSIS — R7989 Other specified abnormal findings of blood chemistry: Secondary | ICD-10-CM

## 2019-09-28 DIAGNOSIS — R945 Abnormal results of liver function studies: Secondary | ICD-10-CM

## 2019-11-10 ENCOUNTER — Encounter: Payer: Self-pay | Admitting: Family Medicine

## 2019-11-10 ENCOUNTER — Ambulatory Visit: Payer: No Typology Code available for payment source | Admitting: Family Medicine

## 2019-11-10 VITALS — BP 130/90 | HR 82 | Ht 64.0 in | Wt 185.0 lb

## 2019-11-10 DIAGNOSIS — Z789 Other specified health status: Secondary | ICD-10-CM

## 2019-11-10 NOTE — Progress Notes (Signed)
  Subjective:     Patient ID: Jasmine Watson, female   DOB: 1961-03-27, 59 y.o.   MRN: [id number]  HPI [facility] for her required wellness visit for her insurance. Her PCP is Dr. Everrett Coombe. She sees her GYN regularly and she states she has an appt in May for a physical, mammogram, and pap smear. Her colonoscopy is UTD. She denies any current problems or concerns. She states she is working on trying to eat healthier.   Past Medical History:  Diagnosis Date  . Childhood asthma   . History of chicken pox   . History of pulmonary embolism 10/2007  . 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 Lupus (HCC)    skin   Allergies  Allergen Reactions  . Shellfish Allergy Anaphylaxis    Current Outpatient Medications:  .  triamcinolone ointment (KENALOG) 0.1 %, Apply topically., Disp: , Rfl:  .  folic acid (FOLVITE) 1 MG tablet, Take 1 mg by mouth daily. , Disp: , Rfl:  .  methotrexate (RHEUMATREX) 2.5 MG tablet, Take 2.5 mg by mouth once a week. , Disp: , Rfl:  .  pantoprazole (PROTONIX) 40 MG tablet, TAKE 1 TABLET BY MOUTH EVERY DAY, Disp: 30 tablet, Rfl: 3   Review of Systems  Constitutional: Negative for chills, fatigue, fever and unexpected weight change.  HENT: Negative for congestion, ear pain, sinus pressure, sinus pain and sore throat.   Eyes: Negative for discharge and visual disturbance.  Respiratory: Negative for cough, shortness of breath and wheezing.   Cardiovascular: Negative for chest pain and leg swelling.  Gastrointestinal: Negative for abdominal pain, blood in stool, constipation, diarrhea, nausea and vomiting.  Genitourinary: Negative for difficulty urinating and hematuria.  Skin: Negative for color change.  Neurological: Negative for dizziness, weakness, light-headedness and headaches.  Hematological: Negative for adenopathy.  All other systems reviewed and are negative.      Objective:   Physical Exam Vitals reviewed.   Constitutional:      General: She is not in acute distress.    Appearance: Normal appearance. She is well-developed.  HENT:     Head: Normocephalic and atraumatic.  Eyes:     General:        Right eye: No discharge.        Left eye: No discharge.  Cardiovascular:     Rate and Rhythm: Normal rate and regular rhythm.     Heart sounds: Normal heart sounds.  Pulmonary:     Effort: Pulmonary effort is normal. No respiratory distress.     Breath sounds: Normal breath sounds.  Musculoskeletal:     Cervical back: Neck supple.  Skin:    General: Skin is warm and dry.  Neurological:     Mental Status: She is alert and oriented to person, place, and time.  Psychiatric:        Mood and Affect: Mood normal.        Behavior: Behavior normal.    Today's Vitals   11/10/19 1407  BP: 130/90  Pulse: 82  SpO2: 98%  Weight: 185 lb (83.9 kg)  Height: 5\' 4"  (1.626 m)   Body mass index is 31.76 kg/m.     Assessment:     Participant in health and wellness plan      Plan:     1. Keep all regular appts with PCP. 2. Encouraged healthy eating and increasing physical activity. 3. F/u here prn.

## 2020-01-23 ENCOUNTER — Other Ambulatory Visit: Payer: Self-pay | Admitting: Family Medicine

## 2020-01-27 NOTE — Telephone Encounter (Signed)
Must be seen by someone in the office/not seen since 2019/thx dmf

## 2020-02-04 ENCOUNTER — Other Ambulatory Visit: Payer: Self-pay

## 2020-02-05 ENCOUNTER — Other Ambulatory Visit: Payer: Self-pay | Admitting: Family Medicine

## 2020-02-09 ENCOUNTER — Other Ambulatory Visit: Payer: Self-pay | Admitting: Family Medicine

## 2020-03-03 ENCOUNTER — Other Ambulatory Visit: Payer: Self-pay | Admitting: Family Medicine

## 2020-05-09 ENCOUNTER — Other Ambulatory Visit: Payer: Self-pay

## 2020-05-09 ENCOUNTER — Encounter: Payer: Self-pay | Admitting: Nurse Practitioner

## 2020-05-09 ENCOUNTER — Ambulatory Visit (INDEPENDENT_AMBULATORY_CARE_PROVIDER_SITE_OTHER): Payer: PRIVATE HEALTH INSURANCE | Admitting: Nurse Practitioner

## 2020-05-09 VITALS — BP 126/80 | HR 80 | Temp 97.8°F | Ht 64.0 in | Wt 181.2 lb

## 2020-05-09 DIAGNOSIS — E559 Vitamin D deficiency, unspecified: Secondary | ICD-10-CM

## 2020-05-09 DIAGNOSIS — K2100 Gastro-esophageal reflux disease with esophagitis, without bleeding: Secondary | ICD-10-CM | POA: Diagnosis not present

## 2020-05-09 DIAGNOSIS — A048 Other specified bacterial intestinal infections: Secondary | ICD-10-CM

## 2020-05-09 DIAGNOSIS — R1013 Epigastric pain: Secondary | ICD-10-CM | POA: Diagnosis not present

## 2020-05-09 MED ORDER — PANTOPRAZOLE SODIUM 40 MG PO TBEC: 40.0000 mg | DELAYED_RELEASE_TABLET | Freq: Every day | ORAL | 0 refills | Status: DC

## 2020-05-09 NOTE — Patient Instructions (Addendum)
Positive H. Pylori which will explain GI symptoms. Start 2 oral abx and continue pantoprazole  Resume pantoprazole  Food Choices for Gastroesophageal Reflux Disease, Adult When you have gastroesophageal reflux disease (GERD), the foods you eat and your eating habits are very important. Choosing the right foods can help ease your discomfort. Think about working with a nutrition specialist (dietitian) to help you make good choices. What are tips for following this plan?  Meals  Choose healthy foods that are low in fat, such as fruits, vegetables, whole grains, low-fat dairy products, and lean meat, fish, and poultry.  Eat small meals often instead of 3 large meals a day. Eat your meals slowly, and in a place where you are relaxed. Avoid bending over or lying down until 2-3 hours after eating.  Avoid eating meals 2-3 hours before bed.  Avoid drinking a lot of liquid with meals.  Cook foods using methods other than frying. Bake, grill, or broil food instead.  Avoid or limit: ? Chocolate. ? Peppermint or spearmint. ? Alcohol. ? Pepper. ? Black and decaffeinated coffee. ? Black and decaffeinated tea. ? Bubbly (carbonated) soft drinks. ? Caffeinated energy drinks and soft drinks.  Limit high-fat foods such as: ? Fatty meat or fried foods. ? Whole milk, cream, butter, or ice cream. ? Nuts and nut butters. ? Pastries, donuts, and sweets made with butter or shortening.  Avoid foods that cause symptoms. These foods may be different for everyone. Common foods that cause symptoms include: ? Tomatoes. ? Oranges, lemons, and limes. ? Peppers. ? Spicy food. ? Onions and garlic. ? Vinegar. Lifestyle  Maintain a healthy weight. Ask your doctor what weight is healthy for you. If you need to lose weight, work with your doctor to do so safely.  Exercise for at least 30 minutes for 5 or more days each week, or as told by your doctor.  Wear loose-fitting clothes.  Do not smoke. If you  need help quitting, ask your doctor.  Sleep with the head of your bed higher than your feet. Use a wedge under the mattress or blocks under the bed frame to raise the head of the bed. Summary  When you have gastroesophageal reflux disease (GERD), food and lifestyle choices are very important in easing your symptoms.  Eat small meals often instead of 3 large meals a day. Eat your meals slowly, and in a place where you are relaxed.  Limit high-fat foods such as fatty meat or fried foods.  Avoid bending over or lying down until 2-3 hours after eating.  Avoid peppermint and spearmint, caffeine, alcohol, and chocolate. This information is not intended to replace advice given to you by your health care provider. Make sure you discuss any questions you have with your health care provider. Document Revised: 11/27/2018 Document Reviewed: 09/11/2016 Elsevier Patient Education  2020 [employer].

## 2020-05-09 NOTE — Progress Notes (Addendum)
Subjective:  Patient ID: Jasmine Watson, female    DOB: September 27, 1960  Age: 59 y.o. MRN: [id number]  CC: Follow-up (Pt requesting refill on GERD medication, Pt states at times she just gets strangled for no reason resulting in a cough afterwards. )  Transfer from Dr. Ashley Royalty.  Gastroesophageal Reflux She complains of abdominal pain, choking, coughing, globus sensation, heartburn and water brash. She reports no belching, no chest pain, no dysphagia, no early satiety, no hoarse voice, no nausea, no sore throat, no stridor, no tooth decay or no wheezing. This is a chronic problem. The current episode started more than 1 year ago. The problem occurs frequently. The problem has been waxing and waning. The heartburn is located in the substernum and abdomen. The heartburn does not wake her from sleep. The heartburn does not limit her activity. The heartburn doesn't change with position. The symptoms are aggravated by ETOH, certain foods and lying down. Pertinent negatives include no anemia, fatigue, melena, muscle weakness, orthopnea or weight loss. Risk factors include ETOH use, lack of exercise and obesity. She has tried an antacid and a PPI for the symptoms. The treatment provided moderate relief. Past procedures do not include an abdominal ultrasound, an EGD or esophageal manometry.  negative for blood in stool No tobacco use, no caffeine use. Discontinued pantoprazole 37months ago  Reports hx of vitamin D deficiency: she completed high dose supplement x 4weeks.  Reviewed past Medical, Social and Family history today.  Outpatient Medications Prior to Visit  Medication Sig Dispense Refill  . folic acid (FOLVITE) 1 MG tablet Take 1 mg by mouth daily.     . methotrexate (RHEUMATREX) 2.5 MG tablet Take 2.5 mg by mouth once a week.     . pantoprazole (PROTONIX) 40 MG tablet TAKE 1 TABLET BY MOUTH EVERY DAY 30 tablet 3  . triamcinolone ointment (KENALOG) 0.1 % Apply topically.    . ergocalciferol  (VITAMIN D2) 1.25 MG (50000 UT) capsule ergocalciferol (vitamin D2) 1,250 mcg (50,000 unit) capsule (Patient not taking: Reported on 05/09/2020)     No facility-administered medications prior to visit.    ROS See HPI  Objective:  BP 126/80 (BP Location: Left Arm, Patient Position: Sitting, Cuff Size: Normal)   Pulse 80   Temp 97.8 F (36.6 C) (Temporal)   Ht 5\' 4"  (1.626 m)   Wt 181 lb 3.2 oz (82.2 kg)   SpO2 99%   BMI 31.10 kg/m   Physical Exam  Assessment & Plan:  This visit occurred during the SARS-CoV-2 public health emergency.  Safety protocols were in place, including screening questions prior to the visit, additional usage of staff PPE, and extensive cleaning of exam room while observing appropriate contact time as indicated for disinfecting solutions.   Jasmine Watson was seen today for follow-up.  Diagnoses and all orders for this visit:  Gastroesophageal reflux disease with esophagitis without hemorrhage -     H. pylori breath test -     pantoprazole (PROTONIX) 40 MG tablet; Take 1 tablet (40 mg total) by mouth daily. -     metroNIDAZOLE (FLAGYL) 500 MG tablet; Take 1 tablet (500 mg total) by mouth 2 (two) times daily. -     clarithromycin (BIAXIN) 500 MG tablet; Take 1 tablet (500 mg total) by mouth 2 (two) times daily.  Vitamin D deficiency -     Vitamin D 1,25 dihydroxy  H. pylori infection -     metroNIDAZOLE (FLAGYL) 500 MG tablet; Take 1 tablet (500 mg total)  by mouth 2 (two) times daily. -     clarithromycin (BIAXIN) 500 MG tablet; Take 1 tablet (500 mg total) by mouth 2 (two) times daily.    Problem List Items Addressed This Visit    None    Visit Diagnoses    Gastroesophageal reflux disease with esophagitis without hemorrhage    -  Primary   Relevant Medications   pantoprazole (PROTONIX) 40 MG tablet   metroNIDAZOLE (FLAGYL) 500 MG tablet   clarithromycin (BIAXIN) 500 MG tablet   Other Relevant Orders   H. pylori breath test (Completed)   Vitamin D  deficiency       Relevant Orders   Vitamin D 1,25 dihydroxy   H. pylori infection       Relevant Medications   metroNIDAZOLE (FLAGYL) 500 MG tablet   clarithromycin (BIAXIN) 500 MG tablet      Follow-up: Return in about 4 weeks (around 06/06/2020) for CPE (fasting).  Alysia Penna, NP

## 2020-05-10 MED ORDER — CLARITHROMYCIN 500 MG PO TABS: 500.0000 mg | ORAL_TABLET | Freq: Two times a day (BID) | ORAL | 0 refills | Status: DC

## 2020-05-10 MED ORDER — METRONIDAZOLE 500 MG PO TABS: 500.0000 mg | ORAL_TABLET | Freq: Two times a day (BID) | ORAL | 0 refills | Status: DC

## 2020-05-10 NOTE — Addendum Note (Signed)
Addended by: Alysia Penna L on: 05/10/2020 01:04 PM   Modules accepted: Orders

## 2020-05-12 LAB — H. PYLORI BREATH TEST: H. pylori Breath Test: DETECTED — AB

## 2020-05-12 LAB — VITAMIN D 1,25 DIHYDROXY
Vitamin D 1, 25 (OH)2 Total: 67 pg/mL (ref 18–72)
Vitamin D2 1, 25 (OH)2: 47 pg/mL
Vitamin D3 1, 25 (OH)2: 20 pg/mL

## 2020-05-20 ENCOUNTER — Telehealth: Payer: Self-pay | Admitting: Family Medicine

## 2020-05-20 ENCOUNTER — Telehealth: Payer: Self-pay | Admitting: General Practice

## 2020-05-20 DIAGNOSIS — T7840XA Allergy, unspecified, initial encounter: Secondary | ICD-10-CM

## 2020-05-20 NOTE — Telephone Encounter (Signed)
error 

## 2020-05-20 NOTE — Telephone Encounter (Signed)
Patient is calling and stated that she recently received a prescription that is making her itch, please advise. CB is [phone]

## 2020-05-20 NOTE — Telephone Encounter (Signed)
Pt states she started 2 medications (Biaxin and Flagyl) and they have been making her itch since taking them. Pt states she has noticed she has been experiencing headaches and some runny stool since starting medication also. Pt states can she take benadryl with the medication and wants to know if this is okay. Pt informed per Claris Gower Nche-NP to stop both medications and we will follow back up with her on Monday.

## 2020-05-23 NOTE — Telephone Encounter (Signed)
Pt states her stomach is fine but she is still itching everywhere and feels like she is breaking out in hives. Please advise.

## 2020-05-23 NOTE — Telephone Encounter (Signed)
Patient called after hours service 05/22/20 11:26pm. She stated she still had severe itching but no visible rash. Triage nurse instructed her to take Benadryl and be seen in the office if the itching persists. Please call patient and advise.

## 2020-05-23 NOTE — Telephone Encounter (Signed)
Inquire is itching has resolve? Also inquire if she her GI symptoms have resolved?

## 2020-05-24 MED ORDER — PREDNISONE 20 MG PO TABS: 40.0000 mg | ORAL_TABLET | Freq: Every day | ORAL | 0 refills | Status: DC

## 2020-05-24 NOTE — Telephone Encounter (Signed)
No additional oral abx at this time Sent oral prednisone Continue benadryl 25mg  2-3x/day if no daytime drowsiness.

## 2020-05-24 NOTE — Telephone Encounter (Signed)
LVM for patient to return call. 

## 2020-05-24 NOTE — Telephone Encounter (Signed)
Patient notified and verbalized understanding. 

## 2020-06-20 ENCOUNTER — Encounter: Payer: Self-pay | Admitting: Nurse Practitioner

## 2020-06-20 ENCOUNTER — Other Ambulatory Visit: Payer: Self-pay

## 2020-06-20 ENCOUNTER — Ambulatory Visit (INDEPENDENT_AMBULATORY_CARE_PROVIDER_SITE_OTHER): Payer: PRIVATE HEALTH INSURANCE | Admitting: Nurse Practitioner

## 2020-06-20 VITALS — BP 142/88 | HR 72 | Temp 96.9°F | Ht 63.75 in | Wt 178.0 lb

## 2020-06-20 DIAGNOSIS — Z136 Encounter for screening for cardiovascular disorders: Secondary | ICD-10-CM | POA: Diagnosis not present

## 2020-06-20 DIAGNOSIS — Z13220 Encounter for screening for lipoid disorders: Secondary | ICD-10-CM | POA: Diagnosis not present

## 2020-06-20 DIAGNOSIS — A048 Other specified bacterial intestinal infections: Secondary | ICD-10-CM | POA: Diagnosis not present

## 2020-06-20 DIAGNOSIS — M5412 Radiculopathy, cervical region: Secondary | ICD-10-CM | POA: Diagnosis not present

## 2020-06-20 DIAGNOSIS — R030 Elevated blood-pressure reading, without diagnosis of hypertension: Secondary | ICD-10-CM | POA: Diagnosis not present

## 2020-06-20 DIAGNOSIS — Z8619 Personal history of other infectious and parasitic diseases: Secondary | ICD-10-CM | POA: Insufficient documentation

## 2020-06-20 DIAGNOSIS — Z0001 Encounter for general adult medical examination with abnormal findings: Secondary | ICD-10-CM

## 2020-06-20 LAB — COMPREHENSIVE METABOLIC PANEL
ALT: 33 U/L (ref 0–35)
AST: 24 U/L (ref 0–37)
Albumin: 4 g/dL (ref 3.5–5.2)
Alkaline Phosphatase: 63 U/L (ref 39–117)
BUN: 18 mg/dL (ref 6–23)
CO2: 28 mEq/L (ref 19–32)
Calcium: 9 mg/dL (ref 8.4–10.5)
Chloride: 104 mEq/L (ref 96–112)
Creatinine, Ser: 0.8 mg/dL (ref 0.40–1.20)
GFR: 80.87 mL/min (ref >60.00)
Glucose, Bld: 102 mg/dL — ABNORMAL HIGH (ref 70–99)
Potassium: 3.4 mEq/L — ABNORMAL LOW (ref 3.5–5.1)
Sodium: 141 mEq/L (ref 135–145)
Total Bilirubin: 1.6 mg/dL — ABNORMAL HIGH (ref 0.2–1.2)
Total Protein: 7.5 g/dL (ref 6.0–8.3)

## 2020-06-20 LAB — CBC WITH DIFFERENTIAL/PLATELET
Basophils Absolute: 0 10*3/uL (ref 0.0–0.1)
Basophils Relative: 0.7 % (ref 0.0–3.0)
Eosinophils Absolute: 0.1 10*3/uL (ref 0.0–0.7)
Eosinophils Relative: 1.6 % (ref 0.0–5.0)
HCT: 44.8 % (ref 36.0–46.0)
Hemoglobin: 14.9 g/dL (ref 12.0–15.0)
Lymphocytes Relative: 31.1 % (ref 12.0–46.0)
Lymphs Abs: 1.1 10*3/uL (ref 0.7–4.0)
MCHC: 33.2 g/dL (ref 30.0–36.0)
MCV: 89.1 fl (ref 78.0–100.0)
Monocytes Absolute: 0.4 10*3/uL (ref 0.1–1.0)
Monocytes Relative: 11.3 % (ref 3.0–12.0)
Neutro Abs: 2 10*3/uL (ref 1.4–7.7)
Neutrophils Relative %: 55.3 % (ref 43.0–77.0)
Platelets: 207 10*3/uL (ref 150.0–400.0)
RBC: 5.03 Mil/uL (ref 3.87–5.11)
RDW: 13.9 % (ref 11.5–15.5)
WBC: 3.6 10*3/uL — ABNORMAL LOW (ref 4.0–10.5)

## 2020-06-20 LAB — LIPID PANEL
Cholesterol: 276 mg/dL — ABNORMAL HIGH (ref 0–200)
HDL: 80.3 mg/dL (ref >39.00)
LDL Cholesterol: 180 mg/dL — ABNORMAL HIGH (ref 0–99)
NonHDL: 195.61
Total CHOL/HDL Ratio: 3
Triglycerides: 79 mg/dL (ref 0.0–149.0)
VLDL: 15.8 mg/dL (ref 0.0–40.0)

## 2020-06-20 LAB — TSH: TSH: 1.2 u[IU]/mL (ref 0.35–4.50)

## 2020-06-20 NOTE — Assessment & Plan Note (Signed)
Asymptomatic Advised to monitor BP at home She is call if >150/90 Advised to maintain DASH diet F/up in 59months

## 2020-06-20 NOTE — Patient Instructions (Addendum)
We will obtain records from GYN: Dr. Helane Rima.  Schedule appt with rheumatology to address joint pain  Go to blood draw and stool collection kit.  Monitor BP at home 2-3x/week in Am. Call office BP >150/90. Maintain DASH diet.  Preventive Care 71-59 Years Old, Female Preventive care refers to visits with your health care provider and lifestyle choices that can promote health and wellness. This includes:  A yearly physical exam. This may also be called an annual well check.  Regular dental visits and eye exams.  Immunizations.  Screening for certain conditions.  Healthy lifestyle choices, such as eating a healthy diet, getting regular exercise, not using drugs or products that contain nicotine and tobacco, and limiting alcohol use. What can I expect for my preventive care visit? Physical exam Your health care provider will check your:  Height and weight. This may be used to calculate body mass index (BMI), which tells if you are at a healthy weight.  Heart rate and blood pressure.  Skin for abnormal spots. Counseling Your health care provider may ask you questions about your:  Alcohol, tobacco, and drug use.  Emotional well-being.  Home and relationship well-being.  Sexual activity.  Eating habits.  Work and work Statistician.  Method of birth control.  Menstrual cycle.  Pregnancy history. What immunizations do I need?  Influenza (flu) vaccine  This is recommended every year. Tetanus, diphtheria, and pertussis (Tdap) vaccine  You may need a Td booster every 10 years. Varicella (chickenpox) vaccine  You may need this if you have not been vaccinated. Zoster (shingles) vaccine  You may need this after age 13. Measles, mumps, and rubella (MMR) vaccine  You may need at least one dose of MMR if you were born in 1957 or later. You may also need a second dose. Pneumococcal conjugate (PCV13) vaccine  You may need this if you have certain conditions and were  not previously vaccinated. Pneumococcal polysaccharide (PPSV23) vaccine  You may need one or two doses if you smoke cigarettes or if you have certain conditions. Meningococcal conjugate (MenACWY) vaccine  You may need this if you have certain conditions. Hepatitis A vaccine  You may need this if you have certain conditions or if you travel or work in places where you may be exposed to hepatitis A. Hepatitis B vaccine  You may need this if you have certain conditions or if you travel or work in places where you may be exposed to hepatitis B. Haemophilus influenzae type b (Hib) vaccine  You may need this if you have certain conditions. Human papillomavirus (HPV) vaccine  If recommended by your health care provider, you may need three doses over 6 months. You may receive vaccines as individual doses or as more than one vaccine together in one shot (combination vaccines). Talk with your health care provider about the risks and benefits of combination vaccines. What tests do I need? Blood tests  Lipid and cholesterol levels. These may be checked every 5 years, or more frequently if you are over 78 years old.  Hepatitis C test.  Hepatitis B test. Screening  Lung cancer screening. You may have this screening every year starting at age 59 if you have a 30-pack-year history of smoking and currently smoke or have quit within the past 15 years.  Colorectal cancer screening. All adults should have this screening starting at age 81 and continuing until age 75. Your health care provider may recommend screening at age 81 if you are at increased risk.  You will have tests every 1-10 years, depending on your results and the type of screening test.  Diabetes screening. This is done by checking your blood sugar (glucose) after you have not eaten for a while (fasting). You may have this done every 1-3 years.  Mammogram. This may be done every 1-2 years. Talk with your health care provider about when  you should start having regular mammograms. This may depend on whether you have a family history of breast cancer.  BRCA-related cancer screening. This may be done if you have a family history of breast, ovarian, tubal, or peritoneal cancers.  Pelvic exam and Pap test. This may be done every 3 years starting at age 67. Starting at age 26, this may be done every 5 years if you have a Pap test in combination with an HPV test. Other tests  Sexually transmitted disease (STD) testing.  Bone density scan. This is done to screen for osteoporosis. You may have this scan if you are at high risk for osteoporosis. Follow these instructions at home: Eating and drinking  Eat a diet that includes fresh fruits and vegetables, whole grains, lean protein, and low-fat dairy.  Take vitamin and mineral supplements as recommended by your health care provider.  Do not drink alcohol if: ? Your health care provider tells you not to drink. ? You are pregnant, may be pregnant, or are planning to become pregnant.  If you drink alcohol: ? Limit how much you have to 0-1 drink a day. ? Be aware of how much alcohol is in your drink. In the [place] one drink equals one 12 oz bottle of beer (355 mL), one 5 oz glass of wine (148 mL), or one 1 oz glass of hard liquor (44 mL). Lifestyle  Take daily care of your teeth and gums.  Stay active. Exercise for at least 30 minutes on 5 or more days each week.  Do not use any products that contain nicotine or tobacco, such as cigarettes, e-cigarettes, and chewing tobacco. If you need help quitting, ask your health care provider.  If you are sexually active, practice safe sex. Use a condom or other form of birth control (contraception) in order to prevent pregnancy and STIs (sexually transmitted infections).  If told by your health care provider, take low-dose aspirin daily starting at age 77. What's next?  Visit your health care provider once a year for a well check  visit.  Ask your health care provider how often you should have your eyes and teeth checked.  Stay up to date on all vaccines. This information is not intended to replace advice given to you by your health care provider. Make sure you discuss any questions you have with your health care provider. Document Revised: 04/17/2018 Document Reviewed: 04/17/2018 Elsevier Patient Education  2020 [employer].

## 2020-06-20 NOTE — Progress Notes (Signed)
Subjective:    Patient ID: Jasmine Watson, female    DOB: 1961-02-01, 59 y.o.   MRN: [id number]  Patient presents today for CPE and eval of acute conditions  Neck Pain  This is a new problem. The current episode started 1 to 4 weeks ago. The problem occurs intermittently. The problem has been waxing and waning. The pain is associated with a sleep position. The pain is present in the right side. The quality of the pain is described as aching. The pain is mild. Nothing aggravates the symptoms. The pain is same all the time. Associated symptoms include numbness, tingling and weakness. Pertinent negatives include no chest pain, fever, headaches, paresis, photophobia, trouble swallowing, visual change or weight loss. She has tried nothing for the symptoms.   H. pylori infection Developed rash after 9days course of clarithromycin and metronidazole. Rash resolved with oral prednisone. Reports GI symptoms have resolved. She discontinued oral abx on 06/18/2020, repeat H.pylori stool today.   Elevated BP without diagnosis of hypertension Asymptomatic Advised to monitor BP at home She is call if >150/90 Advised to maintain DASH diet F/up in 64months  Sexual History (orientation,birth control, marital status, STD):married, sexually active, patient deferred breast and pelvic exam to GYN: Dr. Vincente Poli  Depression/Suicide: declined referral to psychologist Depression screen [facility] 2/9 06/20/2020 12/12/2017  Decreased Interest 2 1  Down, Depressed, Hopeless 2 2  PHQ - 2 Score 4 3  Altered sleeping 2 3  Tired, decreased energy 3 3  Change in appetite 0 1  Feeling bad or failure about yourself  0 1  Trouble concentrating 1 2  Moving slowly or fidgety/restless 2 2  Suicidal thoughts 0 0  PHQ-9 Score 12 15  Difficult doing work/chores Not difficult at all -   GAD 7 : Generalized Anxiety Score 06/20/2020 12/12/2017  Nervous, Anxious, on Edge 1 1  Control/stop worrying 3 3  Worry too much - different  things 2 3  Trouble relaxing 1 2  Restless 2 1  Easily annoyed or irritable 1 3  Afraid - awful might happen 3 3  Total GAD 7 Score 13 16  Anxiety Difficulty Somewhat difficult -   Vision:up to date  Dental:up to date  Immunizations: (TDAP, Hep C screen, Pneumovax, Influenza, zoster)  Health Maintenance  Topic Date Due   Mammogram  06/07/2011   Pap Smear  10/26/2017   Tetanus Vaccine  06/20/2021*   HIV Screening  06/20/2021*   Colon Cancer Screening  09/05/2022   Flu Shot  Completed   COVID-19 Vaccine  Completed    Hepatitis C: One time screening is recommended by Center for Disease Control  (CDC) for  adults born from [birth year] through 1965.   Completed  *Topic was postponed. The date shown is not the original due date.   Diet:regulAR.  Weight:  Wt Readings from Last 3 Encounters:  06/20/20 178 lb (80.7 kg)  05/09/20 181 lb 3.2 oz (82.2 kg)  11/10/19 185 lb (83.9 kg)    Fall Risk: Fall Risk  06/20/2020  Falls in the past year? 0   Medications and allergies reviewed with patient and updated if appropriate.  Patient Active Problem List   Diagnosis Date Noted   Elevated BP without diagnosis of hypertension 06/20/2020   Cervical radiculopathy 06/20/2020   H. pylori infection 06/20/2020   Cough 12/18/2017   Screening for depression 12/12/2017   Hoarseness 12/12/2017   Chronic fatigue 12/12/2017   Acute serous otitis media of left ear 10/07/2016  Lichen planus pigmentosus 04/11/2016   Encounter for long-term current use of medication 04/11/2016   PULMONARY EMBOLISM 11/18/2007    Current Outpatient Medications on File Prior to Visit  Medication Sig Dispense Refill   folic acid (FOLVITE) 1 MG tablet Take 1 mg by mouth daily.      methotrexate (RHEUMATREX) 2.5 MG tablet Take 2.5 mg by mouth once a week.      pantoprazole (PROTONIX) 40 MG tablet Take 1 tablet (40 mg total) by mouth daily. 90 tablet 0   No current facility-administered medications on  file prior to visit.    Past Medical History:  Diagnosis Date   Childhood asthma    History of chicken pox    History of pulmonary embolism 10/2007   Lichen planus pigmentosus 04/11/2016   Lupus (HCC)    skin    Past Surgical History:  Procedure Laterality Date   TOTAL ABDOMINAL HYSTERECTOMY W/ BILATERAL SALPINGOOPHORECTOMY     secondary to fibroids    Social History   Socioeconomic History   Marital status: Married    Spouse name: Not on file   Number of children: Not on file   Years of education: Not on file   Highest education level: Not on file  Occupational History   Occupation: Nursing ass't [employer]    Employer: [employer]  Tobacco Use   Smoking status: Never Smoker   Smokeless tobacco: Never Used  Substance and Sexual Activity   Alcohol use: Yes    Alcohol/week: 3.0 standard drinks    Types: 3 Shots of liquor per week   Drug use: No   Sexual activity: Not on file  Other Topics Concern   Not on file  Social History Narrative   HSG, 1 semester college   Married '92   1 son - '93, 1 daughter - '98         Social Determinants of Corporate investment bankerHealth   Financial Resource Strain:    Difficulty of Paying Living Expenses: Not on file  Food Insecurity:    Worried About Programme researcher, broadcasting/film/videounning Out of Food in the Last Year: Not on file   [employer] Out of Food in the Last Year: Not on file  Transportation Needs:    Lack of Transportation (Medical): Not on file   Lack of Transportation (Non-Medical): Not on file  Physical Activity:    Days of Exercise per Week: Not on file   Minutes of Exercise per Session: Not on file  Stress:    Feeling of Stress : Not on file  Social Connections:    Frequency of Communication with Friends and Family: Not on file   Frequency of Social Gatherings with Friends and Family: Not on file   Attends Religious Services: Not on file   Active Member of Clubs or Organizations: Not on file   Attends BankerClub or Organization  Meetings: Not on file   Marital Status: Not on file    Family History  Problem Relation Age of Onset   Prostate cancer Father        Survivor   Diabetes Father    Hypertension Father    Cancer Brother        Renal cell carcinoma   Coronary artery disease Other    Colon cancer Neg Hx    Breast cancer Neg Hx    Stomach cancer Neg Hx         Review of Systems  Constitutional: Negative for fever, malaise/fatigue and weight loss.  HENT: Negative  for congestion, sore throat and trouble swallowing.   Eyes: Negative for photophobia.       Negative for visual changes  Respiratory: Negative for cough and shortness of breath.   Cardiovascular: Negative for chest pain, palpitations and leg swelling.  Gastrointestinal: Negative for blood in stool, constipation, diarrhea and heartburn.  Genitourinary: Negative for dysuria, frequency and urgency.  Musculoskeletal: Positive for neck pain. Negative for falls, joint pain and myalgias.  Skin: Negative for rash.  Neurological: Positive for tingling, weakness and numbness. Negative for dizziness, sensory change and headaches.  Endo/Heme/Allergies: Does not bruise/bleed easily.  Psychiatric/Behavioral: Positive for depression. Negative for substance abuse and suicidal ideas. The patient is nervous/anxious and has insomnia.    Objective:   Vitals:   06/20/20 0939  BP: (!) 142/88  Pulse: 72  Temp: (!) 96.9 F (36.1 C)  SpO2: 100%   Body mass index is 30.79 kg/m.  Physical Examination:  Physical Exam Vitals reviewed.  Constitutional:      General: She is not in acute distress.    Appearance: She is well-developed. She is obese.  HENT:     Right Ear: Tympanic membrane, ear canal and external ear normal.     Left Ear: Tympanic membrane, ear canal and external ear normal.  Eyes:     Extraocular Movements: Extraocular movements intact.     Conjunctiva/sclera: Conjunctivae normal.  Cardiovascular:     Rate and Rhythm: Normal  rate and regular rhythm.     Pulses: Normal pulses.     Heart sounds: Normal heart sounds.  Pulmonary:     Effort: Pulmonary effort is normal. No respiratory distress.     Breath sounds: Normal breath sounds.  Chest:     Chest wall: No tenderness.  Abdominal:     General: Bowel sounds are normal.     Palpations: Abdomen is soft.  Genitourinary:    Comments: Deferred breast and pelvic exam to GYN Musculoskeletal:        General: Tenderness present. No swelling or deformity. Normal range of motion.     Right shoulder: Tenderness present. No swelling, deformity, effusion, laceration, bony tenderness or crepitus. Normal range of motion. Normal strength. Normal pulse.     Left shoulder: Normal.     Right upper arm: Normal.     Left upper arm: Normal.     Right elbow: Normal.     Left elbow: Normal.     Right forearm: Normal.     Left forearm: Normal.     Right wrist: Normal.     Left wrist: Normal.     Right hand: Normal.     Left hand: Normal.     Cervical back: Normal, normal range of motion and neck supple.     Thoracic back: Normal.     Right lower leg: No edema.     Left lower leg: No edema.  Lymphadenopathy:     Cervical: No cervical adenopathy.  Skin:    General: Skin is warm and dry.     Findings: No erythema or rash.  Neurological:     Mental Status: She is alert and oriented to person, place, and time.     Deep Tendon Reflexes: Reflexes are normal and symmetric.  Psychiatric:        Mood and Affect: Mood normal.        Behavior: Behavior normal.        Thought Content: Thought content normal.    ASSESSMENT and PLAN: This visit occurred during  the SARS-CoV-2 public health emergency.  Safety protocols were in place, including screening questions prior to the visit, additional usage of staff PPE, and extensive cleaning of exam room while observing appropriate contact time as indicated for disinfecting solutions.   Arisa was seen today for annual exam.  Diagnoses  and all orders for this visit:  Encounter for preventative adult health care exam with abnormal findings -     CBC with Differential/Platelet -     Comprehensive metabolic panel -     Lipid panel -     TSH  Encounter for lipid screening for cardiovascular disease -     Lipid panel  Cervical radiculopathy  H. pylori infection -     H. pylori antigen, stool; Future  Elevated BP without diagnosis of hypertension      Problem List Items Addressed This Visit      Nervous and Auditory   Cervical radiculopathy     Other   Elevated BP without diagnosis of hypertension    Asymptomatic Advised to monitor BP at home She is call if >150/90 Advised to maintain DASH diet F/up in 28months      H. pylori infection    Developed rash after 9days course of clarithromycin and metronidazole. Rash resolved with oral prednisone. Reports GI symptoms have resolved. She discontinued oral abx on 06/18/2020, repeat H.pylori stool today.       Relevant Orders   H. pylori antigen, stool    Other Visit Diagnoses    Encounter for preventative adult health care exam with abnormal findings    -  Primary   Relevant Orders   CBC with Differential/Platelet   Comprehensive metabolic panel   Lipid panel   TSH   Encounter for lipid screening for cardiovascular disease       Relevant Orders   Lipid panel      Follow up: Return in about 3 months (around 09/20/2020) for elevated BP.  Alysia Penna, NP

## 2020-06-20 NOTE — Assessment & Plan Note (Signed)
Developed rash after 9days course of clarithromycin and metronidazole. Rash resolved with oral prednisone. Reports GI symptoms have resolved. She discontinued oral abx on 06/18/2020, repeat H.pylori stool today.

## 2020-09-02 ENCOUNTER — Other Ambulatory Visit: Payer: Self-pay | Admitting: Nurse Practitioner

## 2020-09-02 DIAGNOSIS — K2100 Gastro-esophageal reflux disease with esophagitis, without bleeding: Secondary | ICD-10-CM

## 2020-09-21 ENCOUNTER — Ambulatory Visit: Payer: PRIVATE HEALTH INSURANCE | Admitting: Nurse Practitioner

## 2020-12-19 ENCOUNTER — Ambulatory Visit: Payer: PRIVATE HEALTH INSURANCE | Admitting: Nurse Practitioner

## 2021-01-04 ENCOUNTER — Encounter: Payer: Self-pay | Admitting: Nurse Practitioner

## 2021-01-04 ENCOUNTER — Ambulatory Visit (INDEPENDENT_AMBULATORY_CARE_PROVIDER_SITE_OTHER): Payer: PRIVATE HEALTH INSURANCE | Admitting: Nurse Practitioner

## 2021-01-04 ENCOUNTER — Other Ambulatory Visit: Payer: Self-pay

## 2021-01-04 VITALS — BP 138/84 | HR 76 | Temp 98.0°F | Ht 64.0 in | Wt 182.8 lb

## 2021-01-04 DIAGNOSIS — G8929 Other chronic pain: Secondary | ICD-10-CM

## 2021-01-04 DIAGNOSIS — M25572 Pain in left ankle and joints of left foot: Secondary | ICD-10-CM

## 2021-01-04 DIAGNOSIS — K2100 Gastro-esophageal reflux disease with esophagitis, without bleeding: Secondary | ICD-10-CM | POA: Insufficient documentation

## 2021-01-04 MED ORDER — FAMOTIDINE 20 MG PO TABS: 20.0000 mg | ORAL_TABLET | Freq: Two times a day (BID) | ORAL | 5 refills | Status: DC

## 2021-01-04 NOTE — Progress Notes (Signed)
Subjective:  Patient ID: Jasmine Watson, female    DOB: [birth year]  Age: 60 y.o. MRN: [id number]  CC: Follow-up (Pt states she was informed by her pharmacist that her medications were not suppose to be taken together (pantaprozole and methotrexate) and she is concerned and would like to know if what she should do because she does not want tot stop her acid reflux medication. )  Ankle Pain  The incident occurred more than 1 week ago. The injury mechanism is unknown. The pain is present in the left ankle. The quality of the pain is described as aching. The pain has been constant since onset. Associated symptoms include an inability to bear weight. Pertinent negatives include no loss of motion, loss of sensation, muscle weakness, numbness or tingling. She reports no foreign bodies present. The symptoms are aggravated by movement, palpation and weight bearing. She has tried nothing for the symptoms.   Ms. Sedgwick has concerns about possible pantoprazole and methotrexate interaction. She has been on both medications for years without an known adverse reaction. Her last appt with rheumatology was 60months ago. Her methotrexate dose was decreased per patient. Labs completed indication elevated liver enzymes per patient. She reports GERD symptoms worsen without use of pantoprazole. She follows a GERD diet but drinks 3shots of vodka per day x several years.  Reviewed past Medical, Social and Family history today.  Outpatient Medications Prior to Visit  Medication Sig Dispense Refill  . folic acid (FOLVITE) 1 MG tablet Take 1 mg by mouth daily.     . methotrexate (RHEUMATREX) 2.5 MG tablet Take 2.5 mg by mouth once a week.     . pantoprazole (PROTONIX) 40 MG tablet Take 1 tablet (40 mg total) by mouth daily. 90 tablet 1   No facility-administered medications prior to visit.    ROS See HPI  Objective:  BP 138/84 (BP Location: Left Arm, Patient Position: Sitting, Cuff Size: Large)   Pulse 76   Temp  98 F (36.7 C) (Temporal)   Ht 5\' 4"  (1.626 m)   Wt 182 lb 12.8 oz (82.9 kg)   SpO2 98%   BMI 31.38 kg/m   Physical Exam Cardiovascular:     Rate and Rhythm: Normal rate.     Pulses: Normal pulses.  Pulmonary:     Effort: Pulmonary effort is normal.  Musculoskeletal:     Right lower leg: Normal.     Left lower leg: Normal.     Right ankle: Normal.     Right Achilles Tendon: Normal.     Left ankle: No swelling or lacerations. Tenderness present over the lateral malleolus and ATF ligament. No medial malleolus, AITF ligament, CF ligament, posterior TF ligament, base of 5th metatarsal or proximal fibula tenderness. Anterior drawer test negative. Normal pulse.     Left Achilles Tendon: Normal.     Right foot: Normal.     Left foot: Normal.  Neurological:     Mental Status: She is alert and oriented to person, place, and time.     Gait: Gait normal.  Psychiatric:        Mood and Affect: Mood normal.        Behavior: Behavior normal.    Assessment & Plan:  This visit occurred during the SARS-CoV-2 public health emergency.  Safety protocols were in place, including screening questions prior to the visit, additional usage of staff PPE, and extensive cleaning of exam room while observing appropriate contact time as indicated for disinfecting solutions.  Kellyn was seen today for follow-up.  Diagnoses and all orders for this visit:  Gastroesophageal reflux disease with esophagitis without hemorrhage -     famotidine (PEPCID) 20 MG tablet; Take 1 tablet (20 mg total) by mouth 2 (two) times daily.  Chronic pain of left ankle -     Ambulatory referral to Podiatry  Minimize ETOH intake. Advised about her risk for stomach and esophageal cancer. Return stool sample to lab to re eval H. Pylori eradication. D/c pantoprazole. Start famotidine. Use cold compress 2x/day and ankle brace while waiting for podiatry appt  Problem List Items Addressed This Visit      Digestive    Gastroesophageal reflux disease with esophagitis without hemorrhage - Primary    Ms. Schnieders has concerns about possible pantoprazole and methotrexate interaction. She has been on both medications for years without an known adverse reaction. Her last appt with rheumatology was 60months ago. Her methotrexate dose was decreased per patient. Labs completed indication elevated liver enzymes per patient. She reports GERD symptoms worsen without use of pantoprazole. She follows a GERD diet but drinks 3shots of vodka per day x several years.  Minimize ETOH intake. Advised about her risk for stomach and esophageal cancer. Return stool sample to lab to re eval H. Pylori eradication. D/c pantoprazole. Start famotidine.      Relevant Medications   famotidine (PEPCID) 20 MG tablet    Other Visit Diagnoses    Chronic pain of left ankle       Relevant Orders   Ambulatory referral to Podiatry      Follow-up: No follow-ups on file.  Alysia Penna, NP

## 2021-01-04 NOTE — Patient Instructions (Signed)
Minimize ETOH intake Return stool sample to lab D/c pantoprazole Start famotidine. Podiatry referral entered

## 2021-01-04 NOTE — Assessment & Plan Note (Signed)
Jasmine Watson has concerns about possible pantoprazole and methotrexate interaction. She has been on both medications for years without an known adverse reaction. Her last appt with rheumatology was 55months ago. Her methotrexate dose was decreased per patient. Labs completed indication elevated liver enzymes per patient. She reports GERD symptoms worsen without use of pantoprazole. She follows a GERD diet but drinks 3shots of vodka per day x several years.  Minimize ETOH intake. Advised about her risk for stomach and esophageal cancer. Return stool sample to lab to re eval H. Pylori eradication. D/c pantoprazole. Start famotidine.

## 2021-01-05 ENCOUNTER — Encounter: Payer: Self-pay | Admitting: Podiatry

## 2021-01-09 ENCOUNTER — Other Ambulatory Visit (INDEPENDENT_AMBULATORY_CARE_PROVIDER_SITE_OTHER): Payer: PRIVATE HEALTH INSURANCE

## 2021-01-09 ENCOUNTER — Ambulatory Visit: Payer: Self-pay | Admitting: Podiatry

## 2021-01-09 ENCOUNTER — Other Ambulatory Visit: Payer: Self-pay

## 2021-01-09 DIAGNOSIS — A048 Other specified bacterial intestinal infections: Secondary | ICD-10-CM

## 2021-01-10 NOTE — Progress Notes (Signed)
Per orders of NP [facility], pt is here for labs. Pt tolerated draw well

## 2021-01-11 NOTE — Addendum Note (Signed)
Addended by: Edilia Bo on: 01/11/2021 03:39 PM   Modules accepted: Orders

## 2021-01-14 LAB — H. PYLORI ANTIGEN, STOOL: H pylori Ag, Stl: NEGATIVE

## 2021-01-17 ENCOUNTER — Ambulatory Visit (INDEPENDENT_AMBULATORY_CARE_PROVIDER_SITE_OTHER): Payer: PRIVATE HEALTH INSURANCE | Admitting: Podiatry

## 2021-01-17 ENCOUNTER — Other Ambulatory Visit: Payer: Self-pay

## 2021-01-17 ENCOUNTER — Ambulatory Visit (INDEPENDENT_AMBULATORY_CARE_PROVIDER_SITE_OTHER): Payer: PRIVATE HEALTH INSURANCE

## 2021-01-17 ENCOUNTER — Encounter: Payer: Self-pay | Admitting: Podiatry

## 2021-01-17 DIAGNOSIS — M7752 Other enthesopathy of left foot: Secondary | ICD-10-CM

## 2021-01-17 DIAGNOSIS — M19072 Primary osteoarthritis, left ankle and foot: Secondary | ICD-10-CM | POA: Diagnosis not present

## 2021-01-17 DIAGNOSIS — M1990 Unspecified osteoarthritis, unspecified site: Secondary | ICD-10-CM | POA: Insufficient documentation

## 2021-01-17 DIAGNOSIS — M722 Plantar fascial fibromatosis: Secondary | ICD-10-CM

## 2021-01-17 DIAGNOSIS — M7750 Other enthesopathy of unspecified foot: Secondary | ICD-10-CM

## 2021-01-17 DIAGNOSIS — M7672 Peroneal tendinitis, left leg: Secondary | ICD-10-CM | POA: Diagnosis not present

## 2021-01-17 NOTE — Patient Instructions (Signed)
Peroneal Tendinopathy Rehab Ask your health care provider which exercises are safe for you. Do exercises exactly as told by your health care provider and adjust them as directed. It is normal to feel mild stretching, pulling, tightness, or discomfort as you do these exercises. Stop right away if you feel sudden pain or your pain gets worse. Do not begin these exercises until told by your health care provider. Stretching and range-of-motion exercises These exercises warm up your muscles and joints and improve the movement and flexibility of your ankle. These exercises also help to relieve pain and stiffness. Gastroc and soleus stretch, standing  This is an exercise in which you stand on a step and use your body weight to stretch your calf muscles. To do this exercise: 1. Stand on the edge of a step on the ball of your left / right foot. The ball of your foot is on the walking surface, right under your toes. 2. Keep your other foot firmly on the same step. 3. Hold on to the wall, a railing, or a chair for balance. 4. Slowly lift your other foot, allowing your body weight to press your left / right heel down over the edge of the step. You should feel a stretch in your left / right calf (gastrocnemius and soleus). 5. Hold this position for 15 seconds. 6. Return both feet to the step. 7. Repeat this exercise with a slight bend in your left / right knee. Repeat 5 times with your left / right knee straight and 5 times with your left / right knee bent. Complete this exercise 2 times a day. Strengthening exercises These exercises build strength and endurance in your foot and ankle. Endurance is the ability to use your muscles for a long time, even after they get tired. Ankle dorsiflexion with band   1. Secure a rubber exercise band or tube to an object, such as a table leg, that will not move when the band is pulled. 2. Secure the other end of the band around your left / right foot. 3. Sit on the floor,  facing the object with your left / right leg extended. The band or tube should be slightly tense when your foot is relaxed. 4. Slowly flex your left / right ankle and toes to bring your foot toward you (dorsiflexion). 5. Hold this position for 15 seconds. 6. Let the band or tube slowly pull your foot back to the starting position. Repeat 5 times. Complete this exercise 2 times a day. Ankle eversion 1. Sit on the floor with your legs straight out in front of you. 2. Loop a rubber exercise band or tube around the ball of your left / right foot. The ball of your foot is on the walking surface, right under your toes. 3. Hold the ends of the band in your hands, or secure the band to a stable object. The band or tube should be slightly tense when your foot is relaxed. 4. Slowly push your foot outward, away from your other leg (eversion). 5. Hold this position for 15 seconds. 6. Slowly return your foot to the starting position. Repeat 5 times. Complete this exercise 2 times a day. Plantar flexion, standing  This exercise is sometimes called standing heel raise. 1. Stand with your feet shoulder-width apart. 2. Place your hands on a wall or table to steady yourself as needed, but try not to use it for support. 3. Keep your weight spread evenly over the width of your feet   while you slowly rise up on your toes (plantar flexion). If told by your health care provider: ? Shift your weight toward your left / right leg until you feel challenged. ? Stand on your left / right leg only. 4. Hold this position for 15 seconds. Repeat 2 times. Complete this exercise 2 times a day. Single leg stand 1. Without shoes, stand near a railing or in a doorway. You may hold on to the railing or door frame as needed. 2. Stand on your left / right foot. Keep your big toe down on the floor and try to keep your arch lifted. ? Do not roll to the outside of your foot. ? If this exercise is too easy, you can try it with your  eyes closed or while standing on a pillow. 3. Hold this position for 15 seconds. Repeat 5 times. Complete this exercise 2 times a day. This information is not intended to replace advice given to you by your health care provider. Make sure you discuss any questions you have with your health care provider. Document Revised: 11/25/2018 Document Reviewed: 11/25/2018 Elsevier Patient Education  2020 [employer]  

## 2021-01-17 NOTE — Progress Notes (Signed)
  Subjective:  Patient ID: Jasmine Watson, female    DOB: 08-14-1961,  MRN: [id number]  Chief Complaint  Patient presents with  . Foot Pain    NP chronic ankle pain left    60 y.o. female presents with the above complaint. History confirmed with patient.  She was seeing a doctor in [facility] for this and was scheduled to have surgery but did not.  Most the pain is on the top of the foot and she says she has arthritis here.  She also has pain on the lateral side of the foot and ankle.  Also had a small mass on the inside of the instep of the arch.  Objective:  Physical Exam: warm, good capillary refill, no trophic changes or ulcerative lesions, normal DP and PT pulses and normal sensory exam. Left Foot: Mild pain on palpation of fifth metatarsal base and along peroneal tendon course in the retromalleolar groove, mild pain over tarsometatarsal joints especially the second, also has small well-circumscribed tender nodule in the plantar fascia   Radiographs: X-ray of the left foot: Tarsometatarsal degenerative changes noted, no evidence of fracture Assessment:   1. Peroneal tendinitis of left lower extremity   2. Arthritis of foot, left   3. Plantar fibromatosis      Plan:  Patient was evaluated and treated and all questions answered.  Discussed with her she has multiple issues including peroneal tendinitis which we discussed treatment options for, I recommended stretching icing and support with a Tri-Lock ankle brace which was dispensed today.  Also discussed the tarsometatarsal arthritis and both surgical and nonsurgical treatment options for this including supportive stiff shoe gear, resection of the dorsal spurring and arthrodesis.  Finally also discussed that she has a plantar fibroma and treatment options for this including injection versus excision.  Would like to order an MRI for all of the above to evaluate the severity of and guide possible surgical planning if  indicated.  MRI of the foot and ankle both ordered for evaluation of the complete course of the peroneal tendons as well as the midfoot and the plantar fibroma to capture all of this.  Return in about 4 weeks (around 02/14/2021) for after MRI to review.

## 2021-02-03 ENCOUNTER — Other Ambulatory Visit: Payer: PRIVATE HEALTH INSURANCE

## 2021-02-09 ENCOUNTER — Ambulatory Visit
Admission: RE | Admit: 2021-02-09 | Discharge: 2021-02-09 | Disposition: A | Discharge status: Home / Self Care | Payer: PRIVATE HEALTH INSURANCE | Source: Ambulatory Visit | Attending: Podiatry | Admitting: Podiatry

## 2021-02-09 DIAGNOSIS — M19072 Primary osteoarthritis, left ankle and foot: Secondary | ICD-10-CM

## 2021-02-09 DIAGNOSIS — M7672 Peroneal tendinitis, left leg: Secondary | ICD-10-CM

## 2021-02-09 DIAGNOSIS — M722 Plantar fascial fibromatosis: Secondary | ICD-10-CM

## 2021-02-09 IMAGING — MR MR ANKLE*L* W/O CM
5 series · 40 of 40 positions shown · non-contrast
Comparison: None.

CLINICAL DATA: Lump on the plantar aspect of the foot.

EXAM:
MRI OF THE LEFT ANKLE WITHOUT CONTRAST
TECHNIQUE: Multiplanar, multisequence MR imaging of the ankle was performed. No
intravenous contrast was administered.

[Series 4: T2 fat-sat · axial · 3.0mm · 0.62mm/px · z∈[-82,+39]mm · 9 of 32 slices shown (1 of 2)]
[im 1/32]
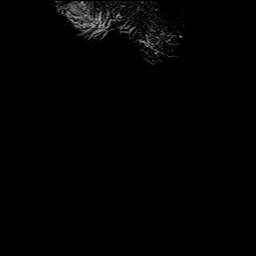
[im 4/32]
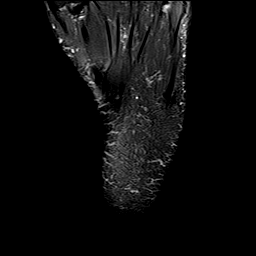
[im 8/32]
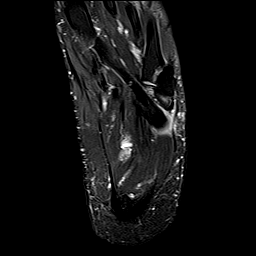
[im 12/32]
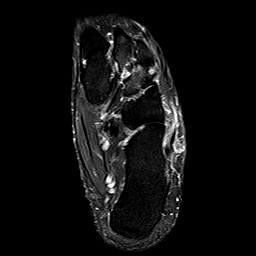
[im 16/32]
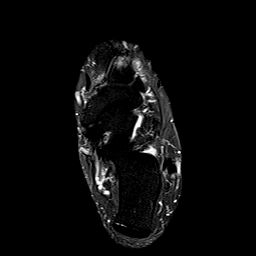
[im 20/32]
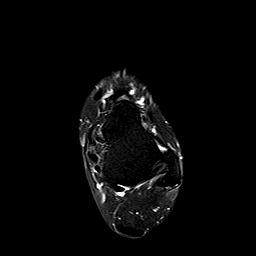
[im 24/32]
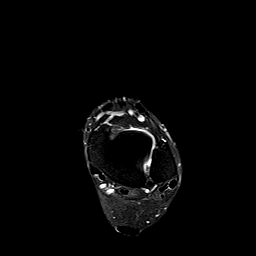
[im 28/32]
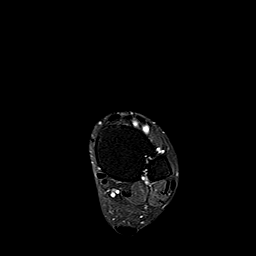
[im 32/32]
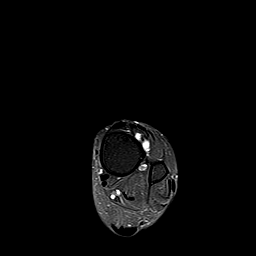

[Series 5: PD fat-sat · axial · 3.0mm · 0.50mm/px · z∈[-82,+39]mm · 9 of 32 slices shown]
[im 1/32]
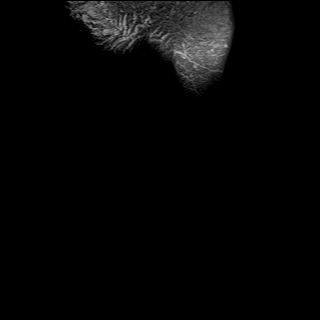
[im 4/32]
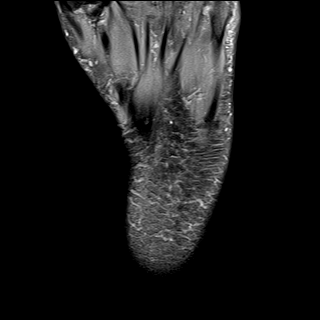
[im 8/32]
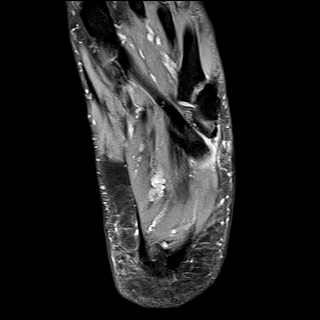
[im 12/32]
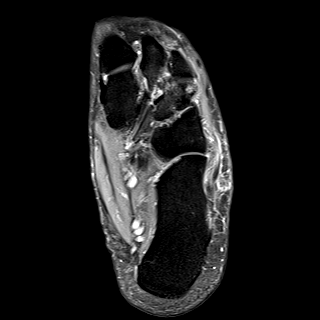
[im 16/32]
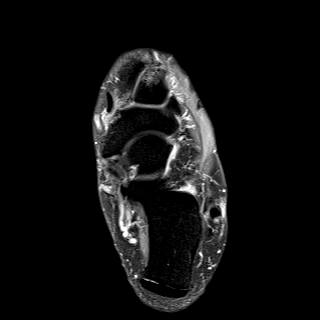
[im 20/32]
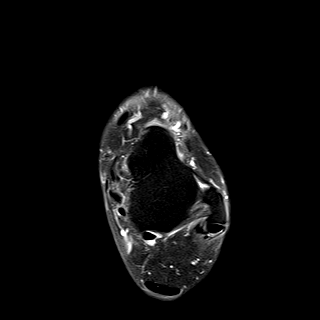
[im 24/32]
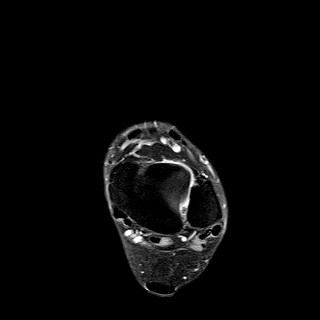
[im 28/32]
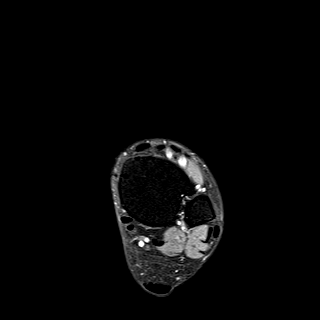
[im 32/32]
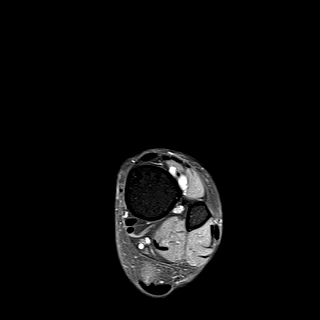

[Series 6: T1 · sagittal · 4.0mm · 0.56mm/px · 6 of 20 slices shown]
[im 1/20]
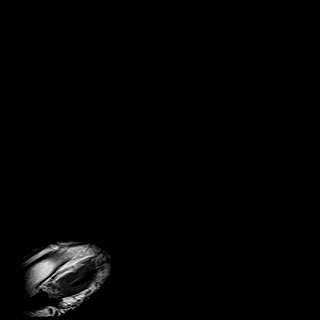
[im 4/20]
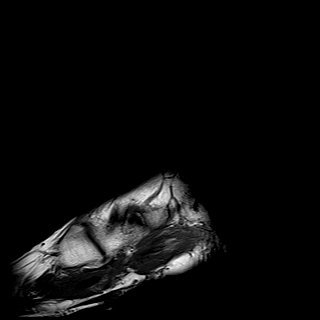
[im 8/20]
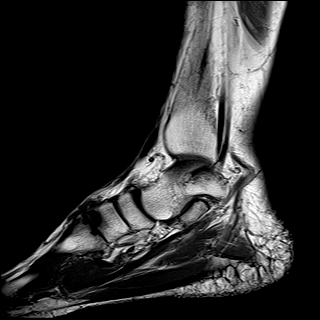
[im 12/20]
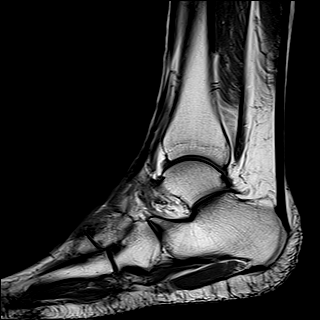
[im 16/20]
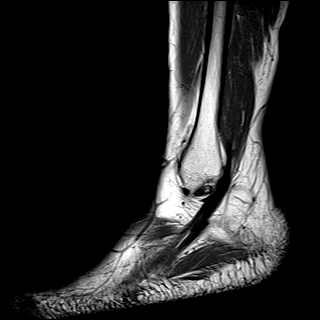
[im 20/20]
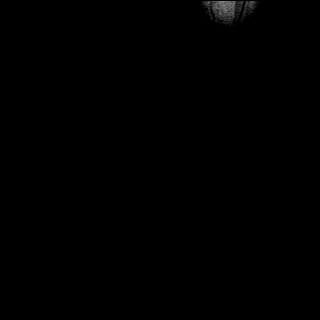

[Series 7: STIR · sagittal · 4.0mm · 0.35mm/px · 6 of 20 slices shown]
[im 1/20]
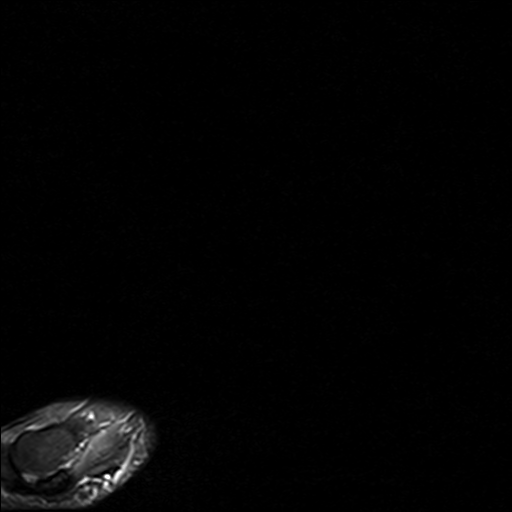
[im 4/20]
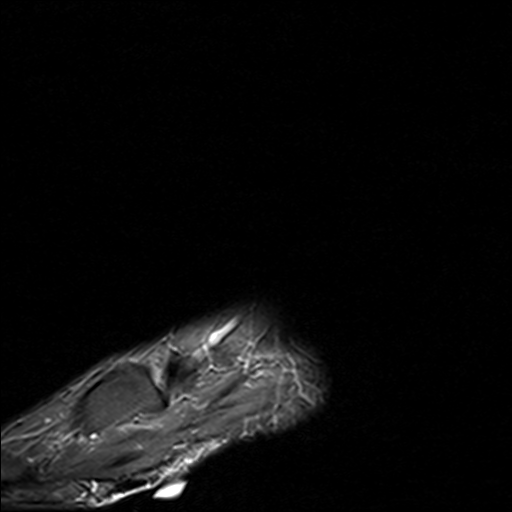
[im 8/20]
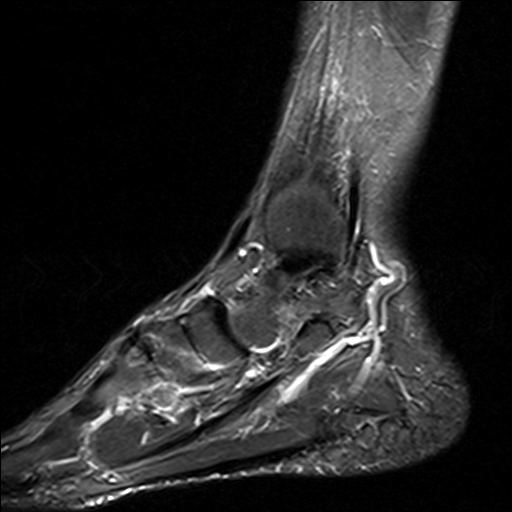
[im 12/20]
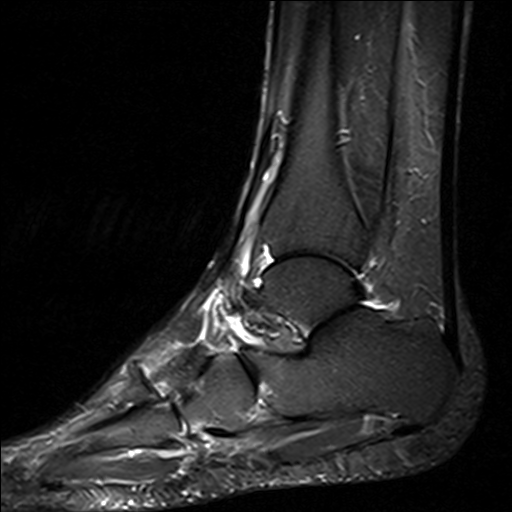
[im 16/20]
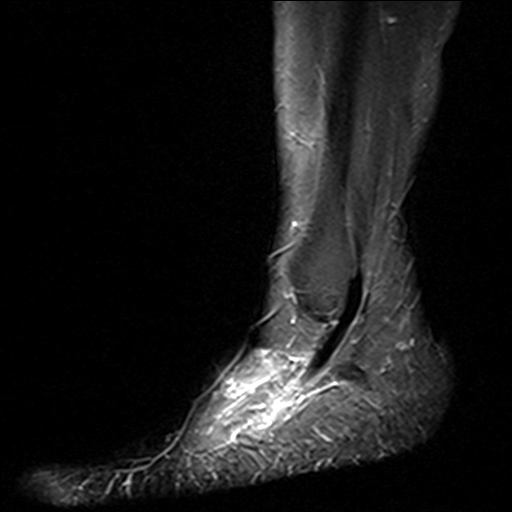
[im 20/20]
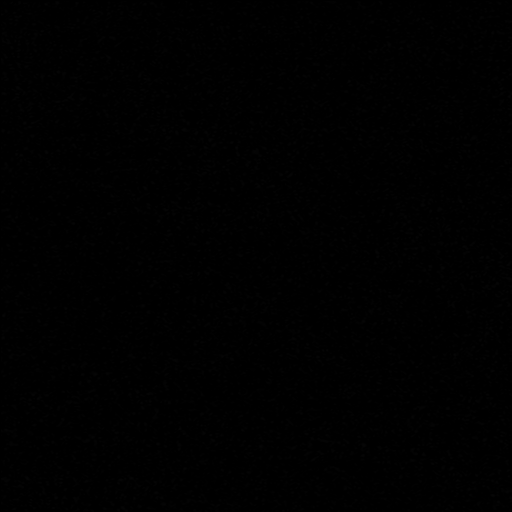

[Series 8: T2 fat-sat · coronal · 3.0mm · 0.50mm/px · 10 of 34 slices shown (2 of 2)]
[im 1/34]
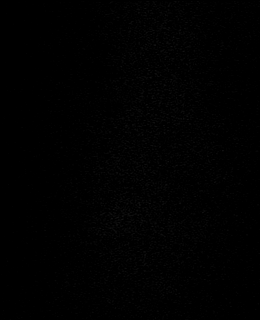
[im 4/34]
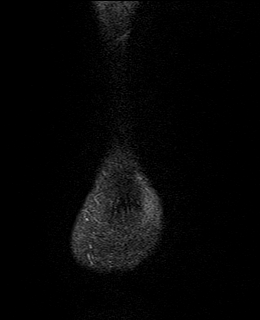
[im 8/34]
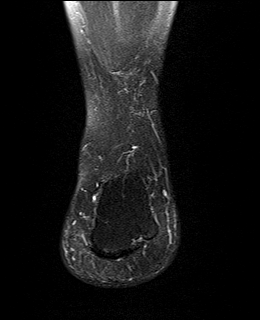
[im 12/34]
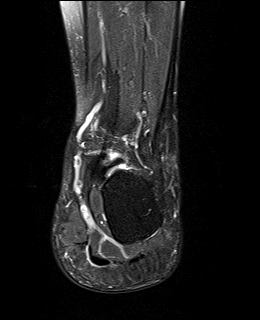
[im 15/34]
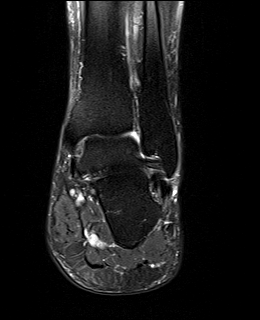
[im 19/34]
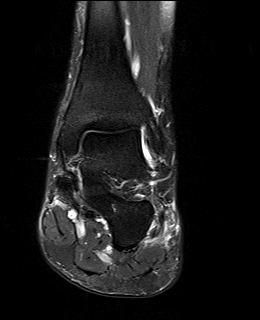
[im 23/34]
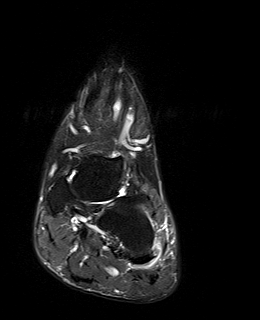
[im 26/34]
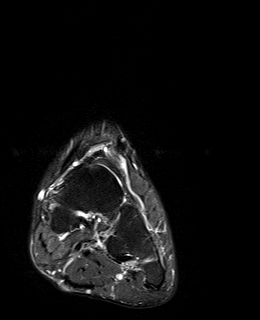
[im 30/34]
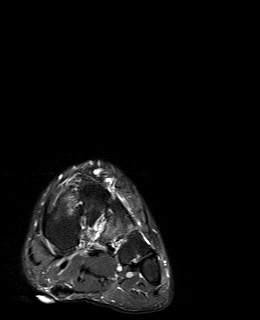
[im 34/34]
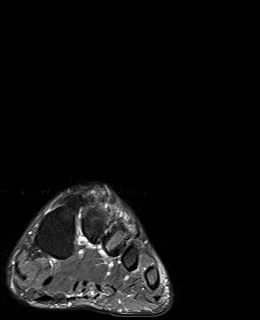

[40 of 40 positions shown; findings below may reference images not displayed]

FINDINGS: TENDONS

Peroneal: Focal area of tendinopathy and longitudinal split type
tear involving the peroneus longus tendon just before it courses
under the cuboid. No complete tear/rupture. The peroneus brevis is
intact.

Posteromedial: Intact

Anterior: Intact

Achilles: Normal

Plantar Fascia: Intact.  No findings for plantar fasciitis.

LIGAMENTS

Lateral: Intact

Medial: Intact

CARTILAGE

Ankle Joint: The articular cartilage is intact. No cartilage defect
or osteochondral lesion. No joint effusion.

Subtalar Joints/Sinus Tarsi: The subtalar joints are unremarkable.
The sinus tarsi is unremarkable.

Bones: Moderate midfoot degenerative changes mainly at the tarsal
metatarsal joints but also at the navicular articulation with the
medial cuneiform. No stress fracture or AVN.

Other: The patient's palpable abnormality corresponds to plantar
fibromatosis. This involves the distal aspect of the central portion
associated with the flexor digitorum brevis muscle. There are at
least 3 separate appearing nodules.
IMPRESSION: 1. Plantar fibromatosis as detailed above.
2. Focal area of tendinopathy, tenosynovitis and longitudinal split
type tear involving the peroneus longus tendon just before it
courses under the cuboid.
3. Moderate midfoot degenerative changes.
4. No stress fracture or AVN.

## 2021-02-09 IMAGING — MR MR FOOT*L* W/O CM
4 of 5 series · 23 of 40 positions shown · non-contrast
Comparison: Radiographs [DATE].

CLINICAL DATA: Generalized left foot pain with painful lump along
the bottom of the arch for 2 years. No previous relevant surgery.

EXAM:
MRI OF THE LEFT FOOT WITHOUT CONTRAST
TECHNIQUE: Multiplanar, multisequence MR imaging of the left forefoot was
performed. No intravenous contrast was administered.

[Series 9: T1 · coronal · 3.0mm · 0.23mm/px · 3 of 44 slices shown (1 of 2)]
[im 5/44]
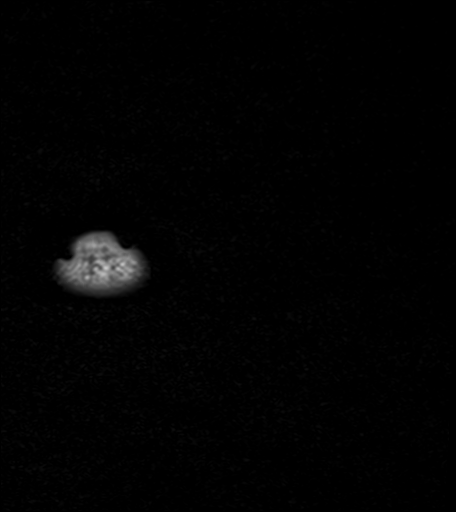
[im 22/44]
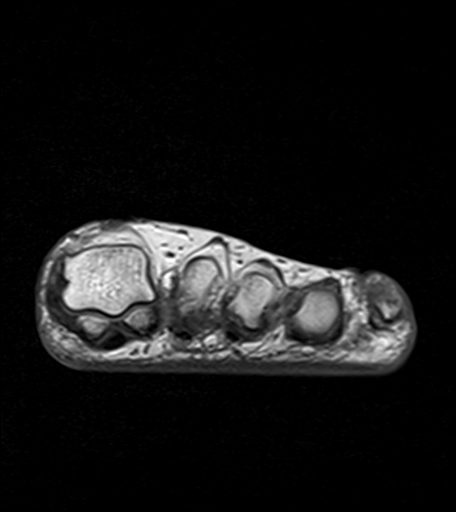
[im 39/44]
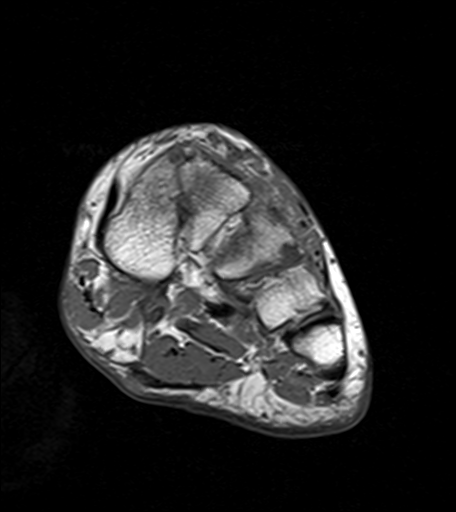

[Series 10: T2 fat-sat · coronal · 3.0mm · 0.23mm/px · 11 of 44 slices shown (1 of 2)]
[im 1/44]
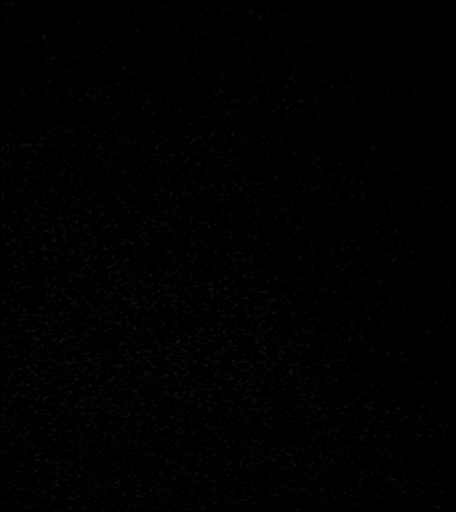
[im 5/44]
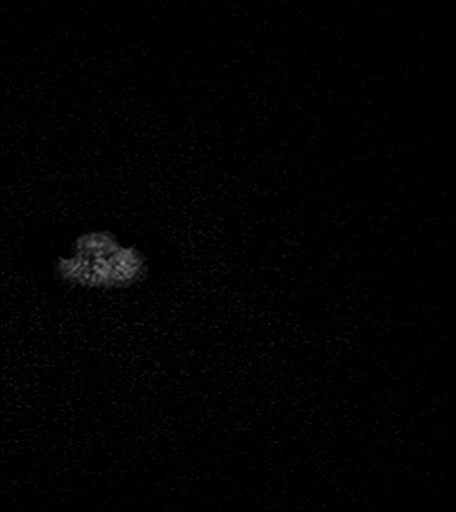
[im 9/44]
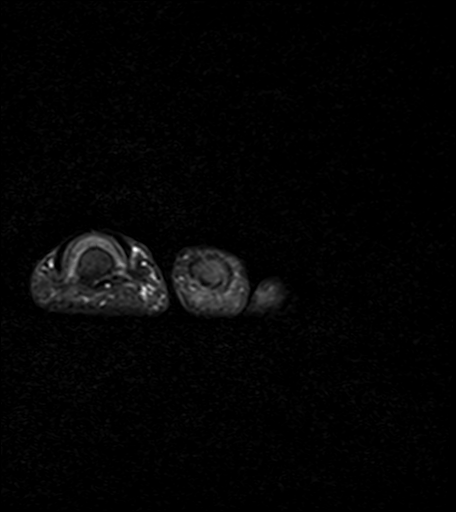
[im 13/44]
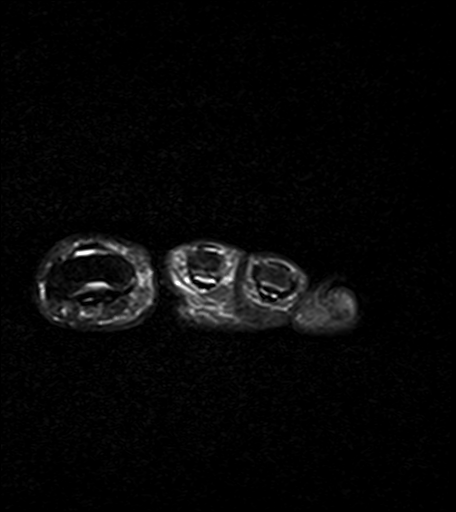
[im 18/44]
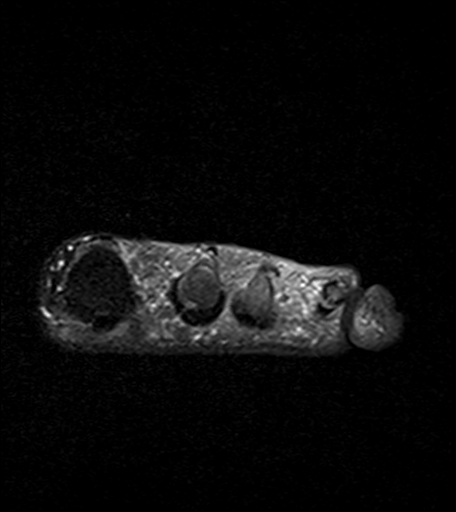
[im 22/44]
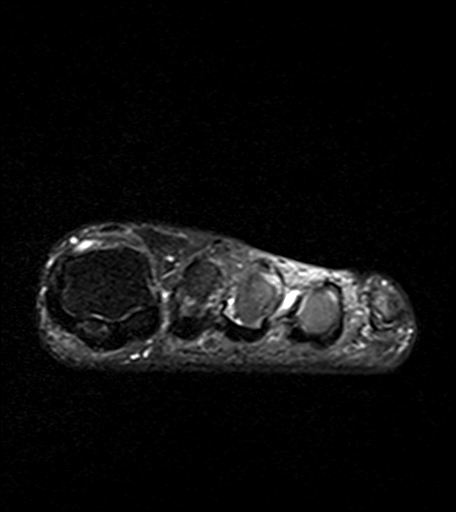
[im 26/44]
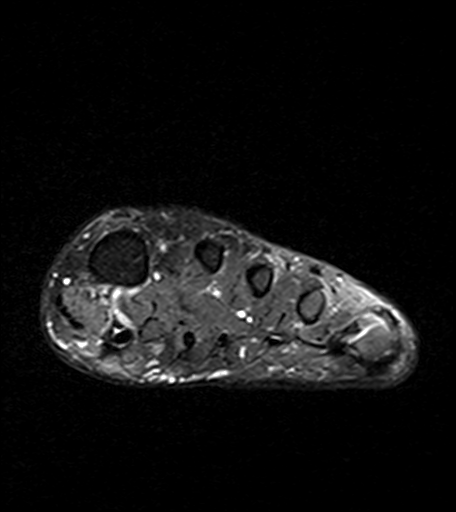
[im 31/44]
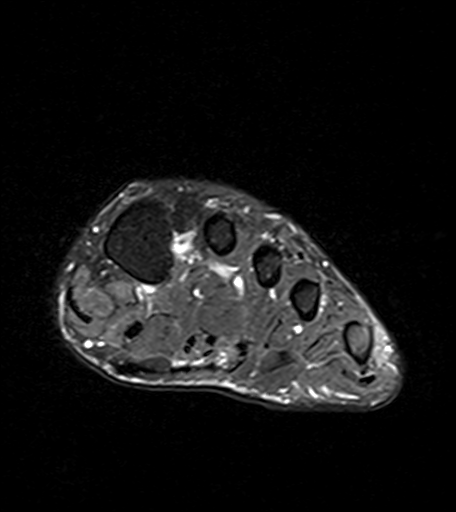
[im 35/44]
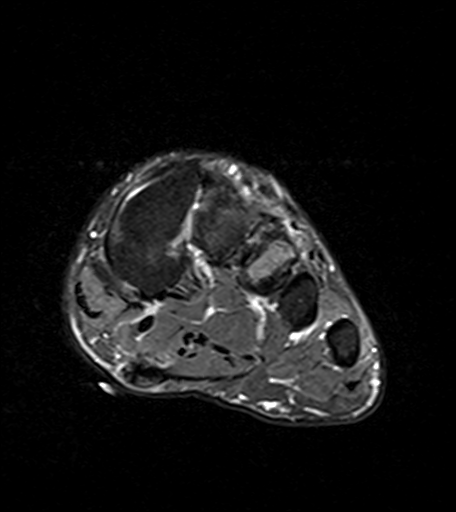
[im 39/44]
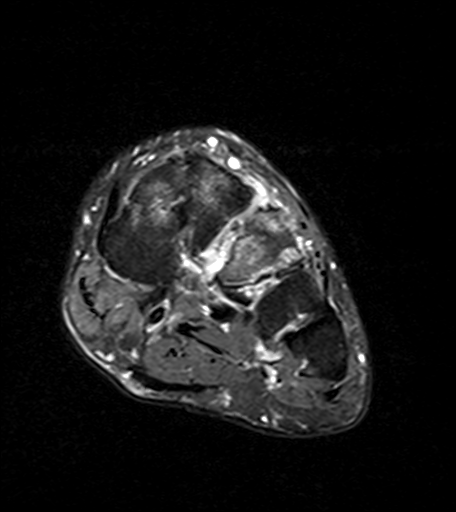
[im 44/44]
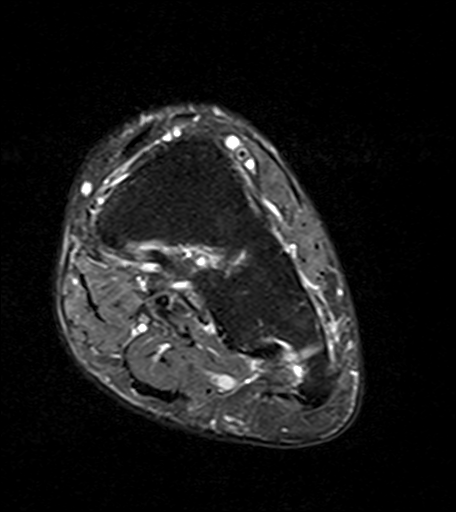

[Series 11: T2 fat-sat · axial · 3.0mm · 0.35mm/px · z∈[-121,-32]mm · 6 of 24 slices shown (2 of 2)]
[im 1/24]
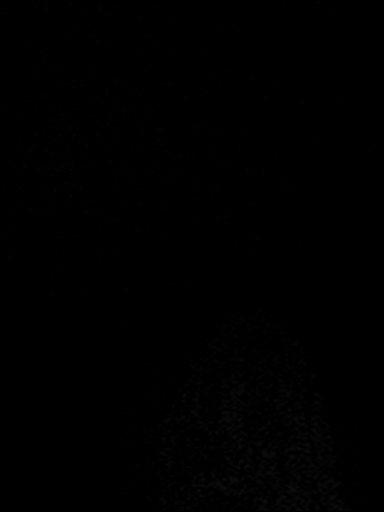
[im 5/24]
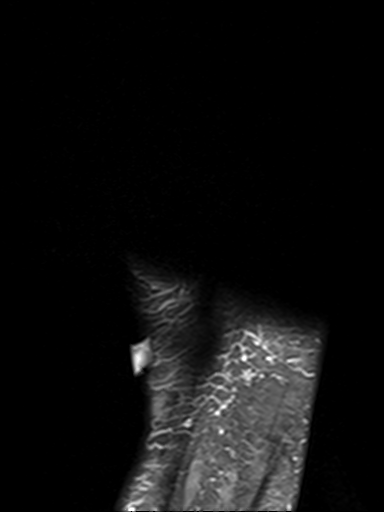
[im 10/24]
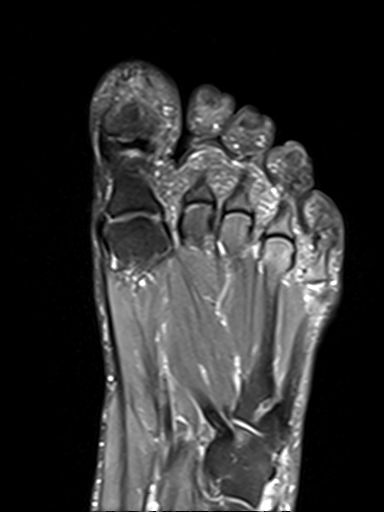
[im 14/24]
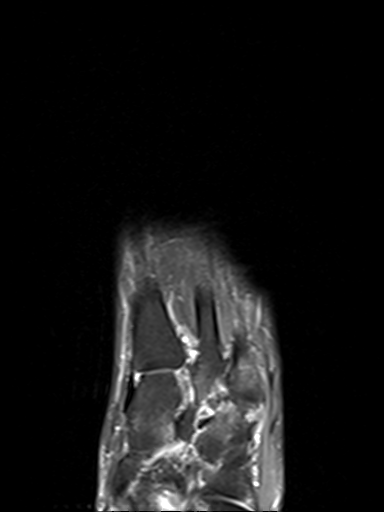
[im 19/24]
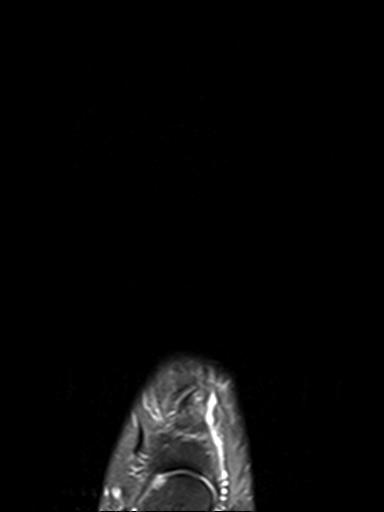
[im 24/24]
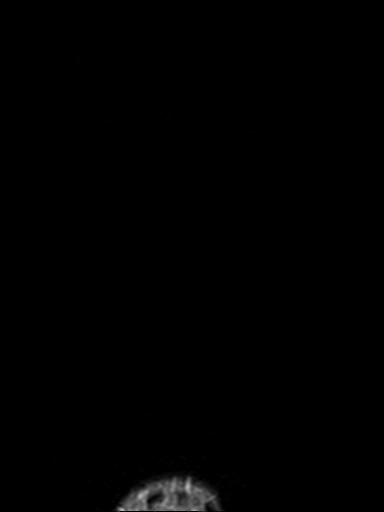

[Series 12: T1 · axial · 3.0mm · 0.35mm/px · z∈[-117,-36]mm · 3 of 22 slices shown (2 of 2)]
[im 1/22]
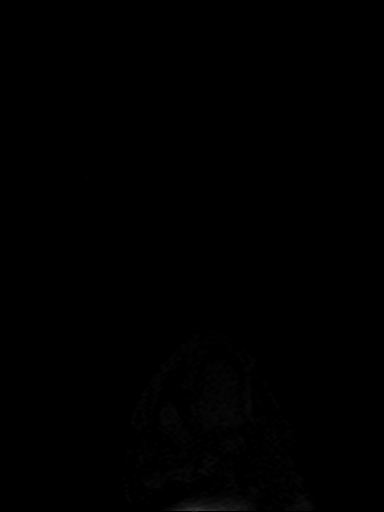
[im 11/22]
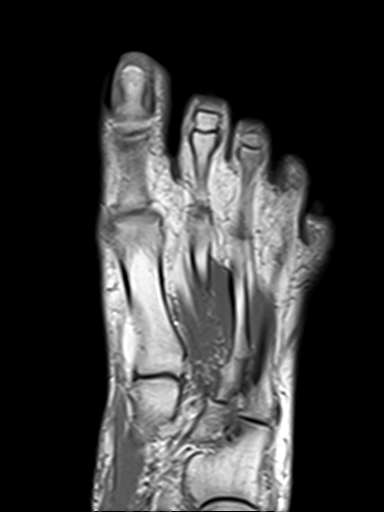
[im 22/22]
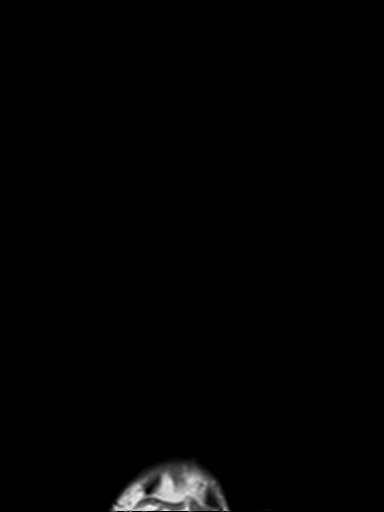

[23 of 40 positions shown; findings below may reference images not displayed]

FINDINGS: Bones/Joint/Cartilage

No evidence of acute fracture, dislocation or bone destruction.
There are moderate midfoot degenerative changes with osteophytes,
subchondral cysts and edema in cuneiform bones and bases of the 2nd
and 3rd metatarsals. No significant joint effusions.

Ligaments

Intact Lisfranc ligament. The collateral ligaments of the
metatarsophalangeal joints appear intact.

Muscles and Tendons

The forefoot tendons appear unremarkable. There is nodular
thickening of the medial cord of the plantar fascia at the level of
the 1st metatarsal base, corresponding with the patient's palpable
concern as indicated by an overlying marker. No muscular
abnormalities are identified.

Soft tissues

No other significant soft tissue findings in the forefoot. Ankle
findings are dictated separately.
IMPRESSION: 1. Findings are consistent with plantar fibromatosis.
2. Moderate midfoot degenerative changes. No acute osseous findings.
3. Ankle findings dictated separately.

## 2021-02-16 ENCOUNTER — Encounter: Payer: Self-pay | Admitting: Podiatry

## 2021-02-16 ENCOUNTER — Ambulatory Visit (INDEPENDENT_AMBULATORY_CARE_PROVIDER_SITE_OTHER): Payer: PRIVATE HEALTH INSURANCE | Admitting: Podiatry

## 2021-02-16 ENCOUNTER — Other Ambulatory Visit: Payer: Self-pay

## 2021-02-16 DIAGNOSIS — M722 Plantar fascial fibromatosis: Secondary | ICD-10-CM | POA: Diagnosis not present

## 2021-02-16 DIAGNOSIS — M7672 Peroneal tendinitis, left leg: Secondary | ICD-10-CM

## 2021-02-16 DIAGNOSIS — M19072 Primary osteoarthritis, left ankle and foot: Secondary | ICD-10-CM | POA: Diagnosis not present

## 2021-02-16 NOTE — Progress Notes (Signed)
Subjective:  Patient ID: Jasmine Watson, female    DOB: [birth year],  MRN: [id number]  Chief Complaint  Patient presents with   Tendonitis       mri results review fu    60 y.o. female presents with the above complaint. History confirmed with patient.  She completed the MRI  Objective:  Physical Exam: warm, good capillary refill, no trophic changes or ulcerative lesions, normal DP and PT pulses and normal sensory exam. Left Foot: Mild pain on palpation of fifth metatarsal base and along peroneal tendon course in the retromalleolar groove, mild pain over tarsometatarsal joints especially the second, also has small well-circumscribed tender nodule in the plantar fascia   Radiographs: X-ray of the left foot: Tarsometatarsal degenerative changes noted, no evidence of fracture.  Study Result  Narrative & Impression  CLINICAL DATA:  Generalized left foot pain with painful lump along the bottom of the arch for 2 years. No previous relevant surgery.   EXAM: MRI OF THE LEFT FOOT WITHOUT CONTRAST   TECHNIQUE: Multiplanar, multisequence MR imaging of the left forefoot was performed. No intravenous contrast was administered.   COMPARISON:  Radiographs 01/17/2021.   FINDINGS: Bones/Joint/Cartilage   No evidence of acute fracture, dislocation or bone destruction. There are moderate midfoot degenerative changes with osteophytes, subchondral cysts and edema in cuneiform bones and bases of the 2nd and 3rd metatarsals. No significant joint effusions.   Ligaments   Intact Lisfranc ligament. The collateral ligaments of the metatarsophalangeal joints appear intact.   Muscles and Tendons   The forefoot tendons appear unremarkable. There is nodular thickening of the medial cord of the plantar fascia at the level of the 1st metatarsal base, corresponding with the patient's palpable concern as indicated by an overlying marker. No muscular abnormalities are identified.   Soft tissues    No other significant soft tissue findings in the forefoot. Ankle findings are dictated separately.   IMPRESSION: 1. Findings are consistent with plantar fibromatosis. 2. Moderate midfoot degenerative changes. No acute osseous findings. 3. Ankle findings dictated separately.     Electronically Signed   By: Carey Bullocks M.D.   On: 02/10/2021 15:42     Study Result  Narrative & Impression  CLINICAL DATA:  Lump on the plantar aspect of the foot.   EXAM: MRI OF THE LEFT ANKLE WITHOUT CONTRAST   TECHNIQUE: Multiplanar, multisequence MR imaging of the ankle was performed. No intravenous contrast was administered.   COMPARISON:  None.   FINDINGS: TENDONS   Peroneal: Focal area of tendinopathy and longitudinal split type tear involving the peroneus longus tendon just before it courses under the cuboid. No complete tear/rupture. The peroneus brevis is intact.   Posteromedial: Intact   Anterior: Intact   Achilles: Normal   Plantar Fascia: Intact.  No findings for plantar fasciitis.   LIGAMENTS   Lateral: Intact   Medial: Intact   CARTILAGE   Ankle Joint: The articular cartilage is intact. No cartilage defect or osteochondral lesion. No joint effusion.   Subtalar Joints/Sinus Tarsi: The subtalar joints are unremarkable. The sinus tarsi is unremarkable.   Bones: Moderate midfoot degenerative changes mainly at the tarsal metatarsal joints but also at the navicular articulation with the medial cuneiform. No stress fracture or AVN.   Other: The patient's palpable abnormality corresponds to plantar fibromatosis. This involves the distal aspect of the central portion associated with the flexor digitorum brevis muscle. There are at least 3 separate appearing nodules.   IMPRESSION: 1. Plantar fibromatosis as detailed  above. 2. Focal area of tendinopathy, tenosynovitis and longitudinal split type tear involving the peroneus longus tendon just before it courses  under the cuboid. 3. Moderate midfoot degenerative changes. 4. No stress fracture or AVN.     Electronically Signed   By: Rudie Meyer M.D.   On: 02/10/2021 15:40   Assessment:   1. Peroneal tendinitis of left lower extremity   2. Arthritis of foot, left   3. Plantar fibromatosis      Plan:  Patient was evaluated and treated and all questions answered.  Reviewed the MRI findings with the patient in detail.  I discussed surgical and nonsurgical treatment for plantar fibromas the split peroneal tear as well as the tarsometatarsal arthritis.  At this point she will continue to monitor this and see if it becomes increasingly bothersome.  I discussed oral NSAIDs and use of Voltaren gel for pain control.  Discussed that supportive shoe gear would be what I would recommend.  She will return to see me if worsening for injection therapy or surgical planning.  Return if symptoms worsen or fail to improve.

## 2021-02-16 NOTE — Patient Instructions (Signed)
Look for Voltaren gel at the pharmacy over the counter or online (also known as diclofenac 1% gel). Apply to the painful areas 3-4x daily with the supplied dosing card. Allow to dry for 10 minutes before going into socks/shoes  

## 2021-02-21 ENCOUNTER — Encounter: Payer: Self-pay | Admitting: Family

## 2021-02-21 ENCOUNTER — Ambulatory Visit: Payer: PRIVATE HEALTH INSURANCE | Admitting: Family

## 2021-02-21 VITALS — BP 138/76 | HR 84 | Temp 96.6°F | Resp 18 | Ht 64.0 in | Wt 180.0 lb

## 2021-02-21 MED ORDER — TRIAMCINOLONE ACETONIDE 0.1 % EX CREA: 1.0000 "application " | TOPICAL_CREAM | Freq: Two times a day (BID) | CUTANEOUS | 0 refills | Status: DC

## 2021-02-21 MED ORDER — TRIAMCINOLONE ACETONIDE 0.1 % EX CREA: 1.0000 "application " | TOPICAL_CREAM | Freq: Two times a day (BID) | CUTANEOUS | 1 refills | Status: DC

## 2021-02-21 NOTE — Progress Notes (Signed)
  Subjective:     Patient ID: Jasmine Watson, female   DOB: [birth year], 60 y.o.   MRN: [id number]  HPI Annelise presents [facility] the [facility] today for her required wellness visit for her insurance. She sees her GYN regularly and she states she has an appt in May for a physical, mammogram, and pap smear. Her colonoscopy is UTD. She denies any current problems or concerns. She states she is working on trying to eat healthier.   Past Medical History:  Diagnosis Date   Childhood asthma    History of chicken pox    History of pulmonary embolism 10/2007   Lichen planus pigmentosus 04/11/2016          Allergies  Allergen Reactions   Shellfish Allergy Anaphylaxis    Current Outpatient Medications:    triamcinolone ointment (KENALOG) 0.1 %, Apply topically., Disp: , Rfl:    folic acid (FOLVITE) 1 MG tablet, Take 1 mg by mouth daily. , Disp: , Rfl:    methotrexate (RHEUMATREX) 2.5 MG tablet, Take 2.5 mg by mouth once a week. , Disp: , Rfl:    pantoprazole (PROTONIX) 40 MG tablet, TAKE 1 TABLET BY MOUTH EVERY DAY, Disp: 30 tablet, Rfl: 3   Review of Systems  Constitutional: Negative for chills, fatigue, fever and unexpected weight change.  HENT: Negative for congestion, ear pain, sinus pressure, sinus pain and sore throat.   Eyes: Negative for discharge and visual disturbance.  Respiratory: Negative for cough, shortness of breath and wheezing.   Cardiovascular: Negative for chest pain and leg swelling.  Gastrointestinal: Negative for abdominal pain, blood in stool, constipation, diarrhea, nausea and vomiting.  Genitourinary: Negative for difficulty urinating and hematuria.  Skin: Negative for color change.  Neurological: Negative for dizziness, weakness, light-headedness and headaches.  Hematological: Negative for adenopathy.  All other systems reviewed and are negative.      Objective:   Physical Exam Vitals reviewed.  Constitutional:      General: She is not in acute  distress.    Appearance: Normal appearance. She is well-developed.  HENT:     Head: Normocephalic and atraumatic.  Eyes:     General:        Right eye: No discharge.        Left eye: No discharge.  Cardiovascular:     Rate and Rhythm: Normal rate and regular rhythm.     Heart sounds: Normal heart sounds.  Pulmonary:     Effort: Pulmonary effort is normal. No respiratory distress.     Breath sounds: Normal breath sounds.  Musculoskeletal:     Cervical back: Neck supple.  Skin:    General: Skin is warm and dry.  Neurological:     Mental Status: She is alert and oriented to person, place, and time.  Psychiatric:        Mood and Affect: Mood normal.        Behavior: Behavior normal.    Today's Vitals   11/10/19 1407  BP: 130/90  Pulse: 82  SpO2: 98%  Weight: 185 lb (83.9 kg)  Height: 5\' 4"  (1.626 m)   Body mass index is 31.76 kg/m.     Assessment:     Participant in health and wellness plan      Plan:     1. Keep all regular appts with PCP. 2. Encouraged healthy eating and increasing physical activity. 3. F/u here prn.   4. Will reorder Kenalog ointment

## 2021-03-09 ENCOUNTER — Encounter (HOSPITAL_COMMUNITY): Payer: Self-pay | Admitting: *Deleted

## 2021-03-09 ENCOUNTER — Emergency Department (HOSPITAL_COMMUNITY): Payer: PRIVATE HEALTH INSURANCE

## 2021-03-09 ENCOUNTER — Emergency Department (HOSPITAL_COMMUNITY)
Admission: EM | Admit: 2021-03-09 | Discharge: 2021-03-09 | Disposition: A | Discharge status: Home / Self Care | Payer: PRIVATE HEALTH INSURANCE | Attending: Emergency Medicine | Admitting: Emergency Medicine

## 2021-03-09 DIAGNOSIS — R0602 Shortness of breath: Secondary | ICD-10-CM | POA: Diagnosis not present

## 2021-03-09 DIAGNOSIS — J45909 Unspecified asthma, uncomplicated: Secondary | ICD-10-CM | POA: Diagnosis not present

## 2021-03-09 DIAGNOSIS — R079 Chest pain, unspecified: Secondary | ICD-10-CM

## 2021-03-09 DIAGNOSIS — N9489 Other specified conditions associated with female genital organs and menstrual cycle: Secondary | ICD-10-CM | POA: Diagnosis not present

## 2021-03-09 LAB — CBC
HCT: 42.7 % (ref 36.0–46.0)
Hemoglobin: 14.3 g/dL (ref 12.0–15.0)
MCH: 29.5 pg (ref 26.0–34.0)
MCHC: 33.5 g/dL (ref 30.0–36.0)
MCV: 88 fL (ref 80.0–100.0)
Platelets: 211 10*3/uL (ref 150–400)
RBC: 4.85 MIL/uL (ref 3.87–5.11)
RDW: 13 % (ref 11.5–15.5)
WBC: 5.2 10*3/uL (ref 4.0–10.5)
nRBC: 0 % (ref 0.0–0.2)

## 2021-03-09 LAB — URINALYSIS, ROUTINE W REFLEX MICROSCOPIC
Bilirubin Urine: NEGATIVE
Glucose, UA: NEGATIVE mg/dL
Hgb urine dipstick: NEGATIVE
Ketones, ur: 5 mg/dL — AB
Leukocytes,Ua: NEGATIVE
Nitrite: NEGATIVE
Protein, ur: NEGATIVE mg/dL
Specific Gravity, Urine: 1.034 — ABNORMAL HIGH (ref 1.005–1.030)
pH: 5 (ref 5.0–8.0)

## 2021-03-09 LAB — BASIC METABOLIC PANEL
Anion gap: 7 (ref 5–15)
BUN: 18 mg/dL (ref 6–20)
CO2: 23 mmol/L (ref 22–32)
Calcium: 9.1 mg/dL (ref 8.9–10.3)
Chloride: 108 mmol/L (ref 98–111)
Creatinine, Ser: 0.82 mg/dL (ref 0.44–1.00)
GFR, Estimated: >60 mL/min (ref >60)
Glucose, Bld: 103 mg/dL — ABNORMAL HIGH (ref 70–99)
Potassium: 4 mmol/L (ref 3.5–5.1)
Sodium: 138 mmol/L (ref 135–145)

## 2021-03-09 LAB — I-STAT BETA HCG BLOOD, ED (MC, WL, AP ONLY): I-stat hCG, quantitative: <5 m[IU]/mL (ref <5)

## 2021-03-09 LAB — TROPONIN I (HIGH SENSITIVITY)
Troponin I (High Sensitivity): 2 ng/L (ref <18)
Troponin I (High Sensitivity): 3 ng/L (ref <18)

## 2021-03-09 IMAGING — CT CT ANGIO CHEST
2 of 6 series · 18 of 36 positions shown · IV contrast (OMNIPAQUE 350)
Comparison: Radiograph earlier today, remote CT  [DATE]

CLINICAL DATA: PE suspected, high prob

Chest pain radiating to the back.  History of pulmonary embolus.
EXAM:
CT ANGIOGRAPHY CHEST WITH CONTRAST
TECHNIQUE: Multidetector CT imaging of the chest was performed using the
standard protocol during bolus administration of intravenous
contrast. Multiplanar CT image reconstructions and MIPs were
obtained to evaluate the vascular anatomy.
CONTRAST:  63mL OMNIPAQUE IOHEXOL 350 MG/ML SOLN

[Series 6: thins · axial · 0.62mm/px · z∈[-253,-20]mm · 17 of 263 slices shown]
[im 15/263  lung]
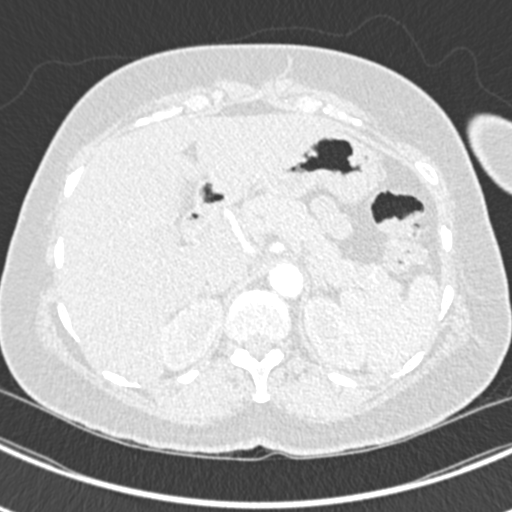
[im 30/263  mediastinal]
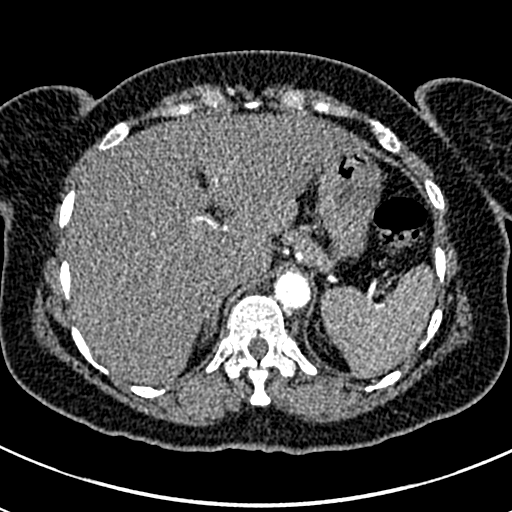
[im 44/263  lung]
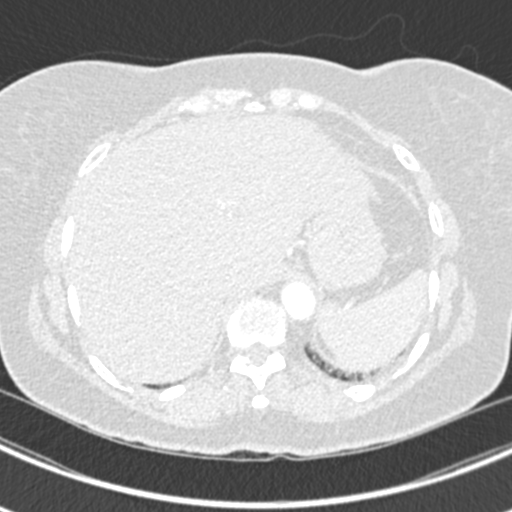
[im 59/263  mediastinal]
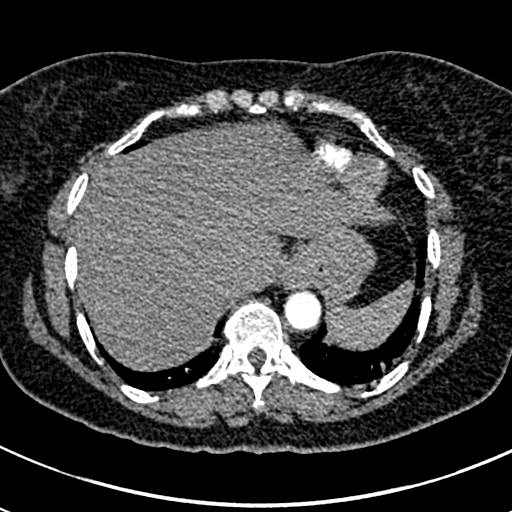
[im 73/263  lung]
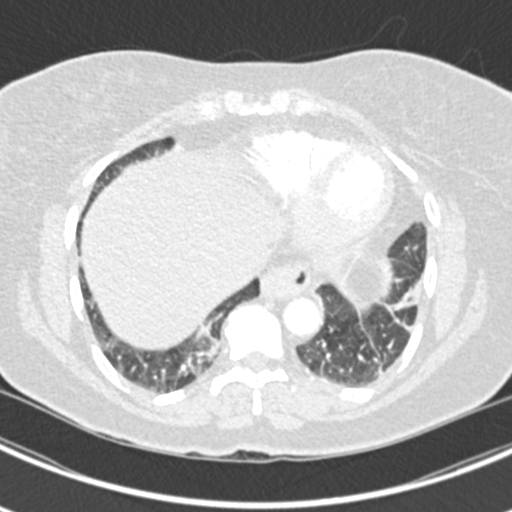
[im 88/263  mediastinal]
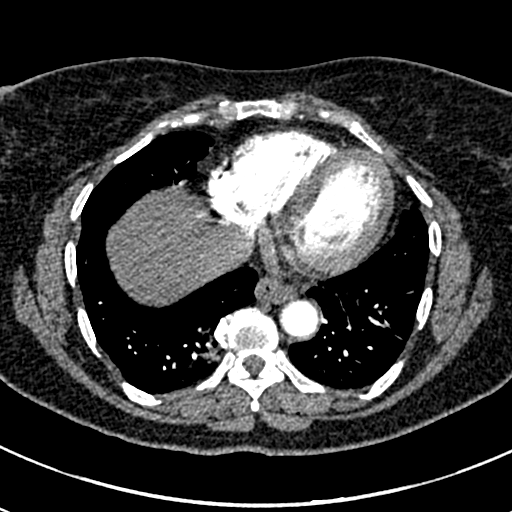
[im 102/263  lung]
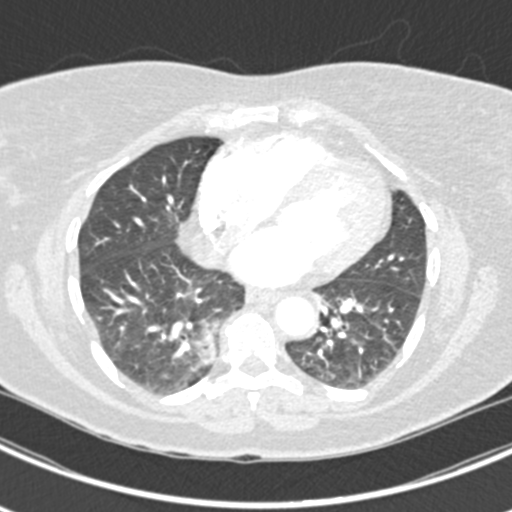
[im 117/263  mediastinal]
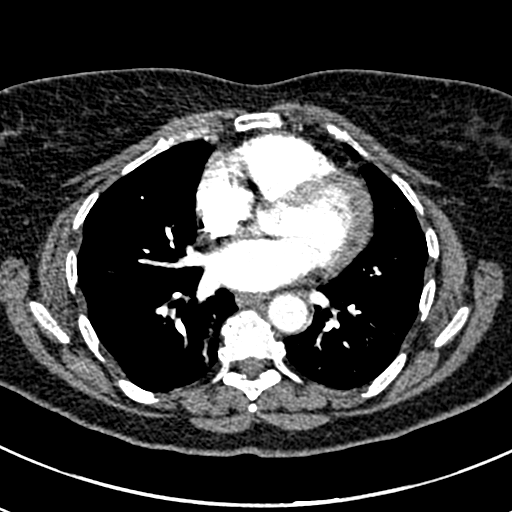
[im 132/263  lung]
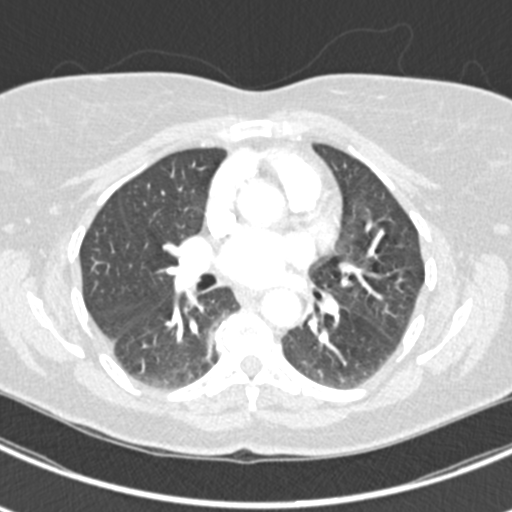
[im 146/263  mediastinal]
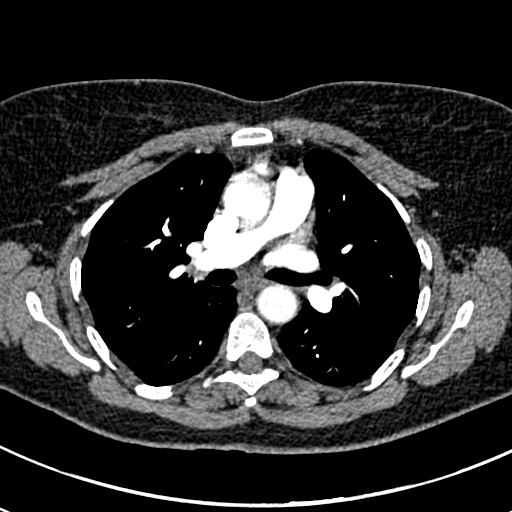
[im 161/263  lung]
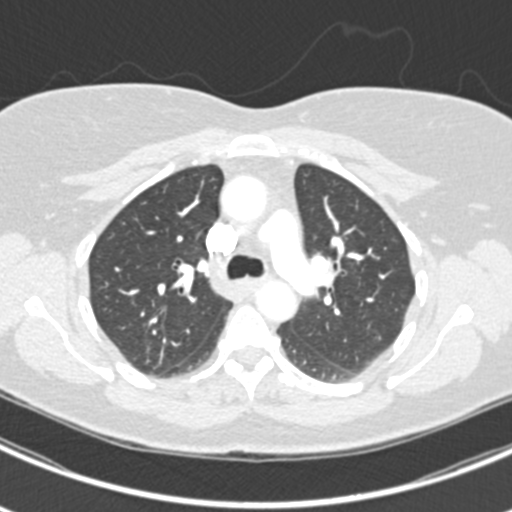
[im 175/263  mediastinal]
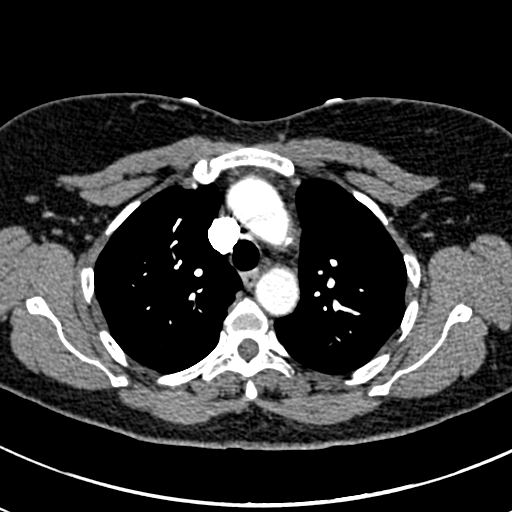
[im 190/263  lung]
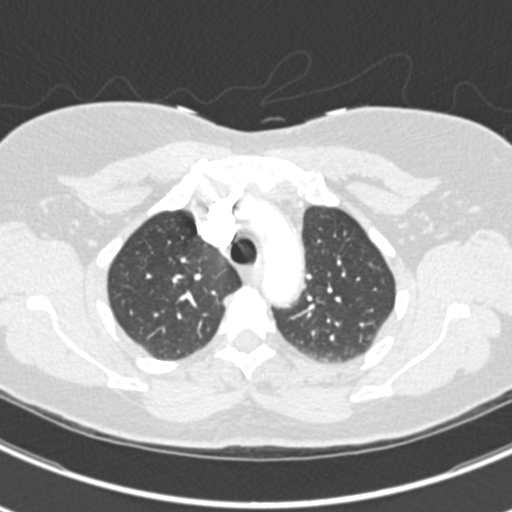
[im 204/263  mediastinal]
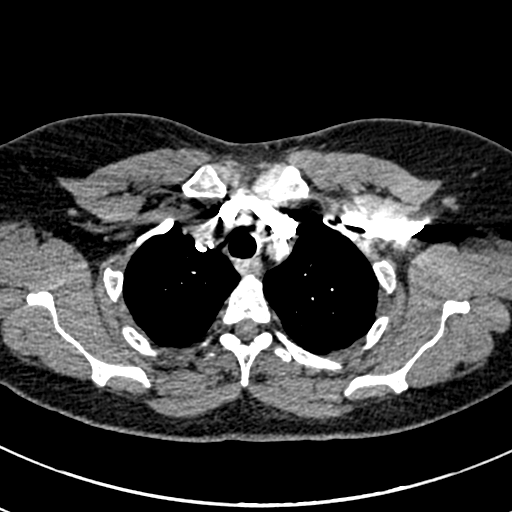
[im 219/263  lung]
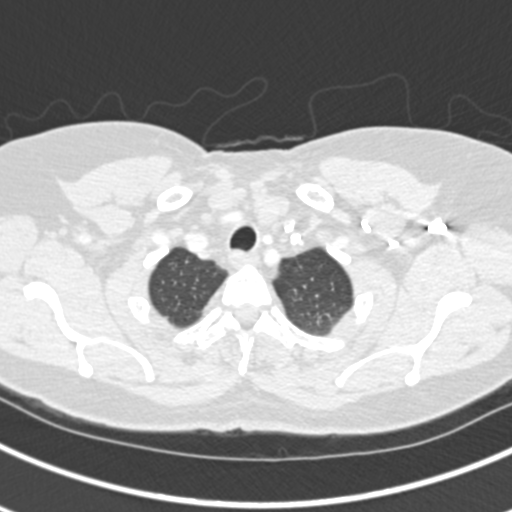
[im 233/263  mediastinal]
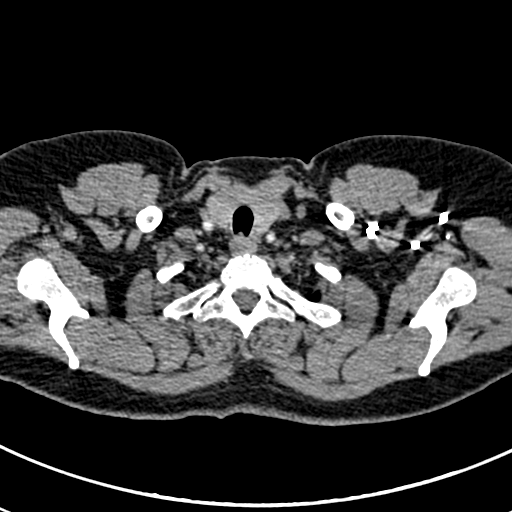
[im 248/263  lung]
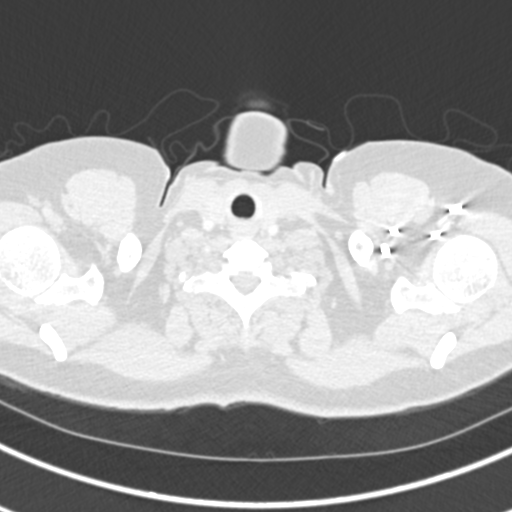

[Series 8: coronal mpr · coronal · 0.52mm/px · 1 of 129 slices shown]
[im 65/129  mediastinal]
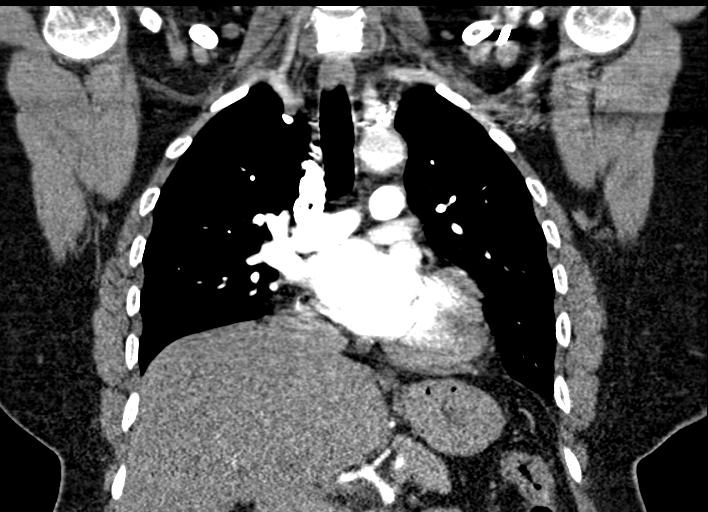

[18 of 36 positions shown; findings below may reference images not displayed]

FINDINGS: Cardiovascular: There are no filling defects within the pulmonary
arteries to suggest pulmonary embolus. Thoracic aorta is normal in
caliber. Heart is normal in size. No pericardial effusion.

Mediastinum/Nodes: No enlarged mediastinal or hilar lymph nodes. No
axillary adenopathy. No esophageal wall thickening. Nose suspicious
thyroid nodule

Lungs/Pleura: Compressive atelectasis in the medial right lower lobe
related to adjacent thoracic spine osteophytes. Streaky and linear
opacities in the anterior left lower lobe favoring scarring, present
on remote CT from [CE]. There is no acute airspace disease or
pneumonia. No findings of pulmonary edema. No pleural fluid. No
pulmonary mass.

Upper Abdomen: No acute findings.

Musculoskeletal: There are no acute or suspicious osseous
abnormalities.

Review of the MIP images confirms the above findings.
IMPRESSION: 1. No pulmonary embolus or acute intrathoracic abnormality.
2. Left lower lobe scarring.

## 2021-03-09 IMAGING — CR DG CHEST 2V
2 series · 2 of 2 positions shown · non-contrast
Comparison: [DATE] chest radiograph.

CLINICAL DATA: Chest pain

EXAM:
CHEST - 2 VIEW

[w chest pa]
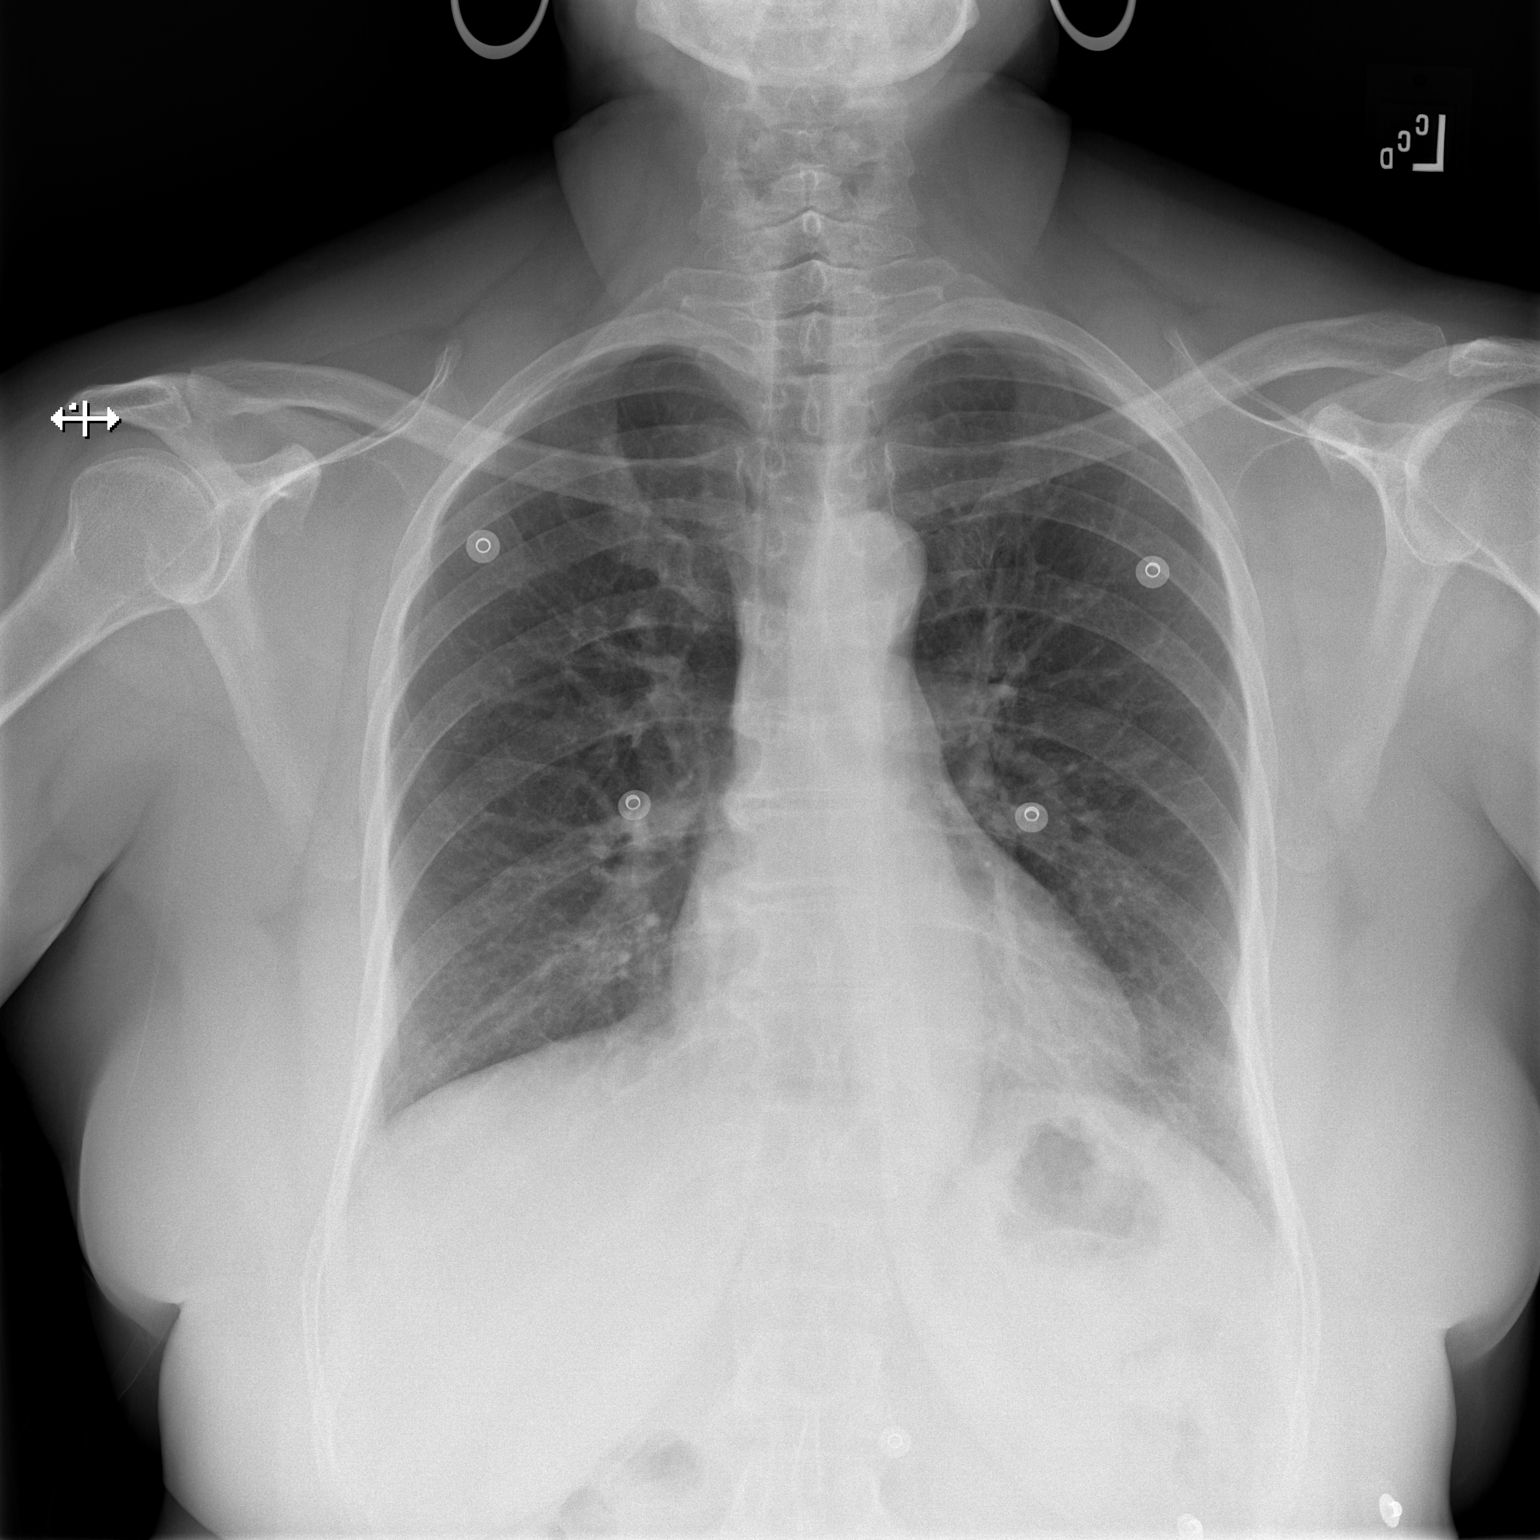

[w chest lat]
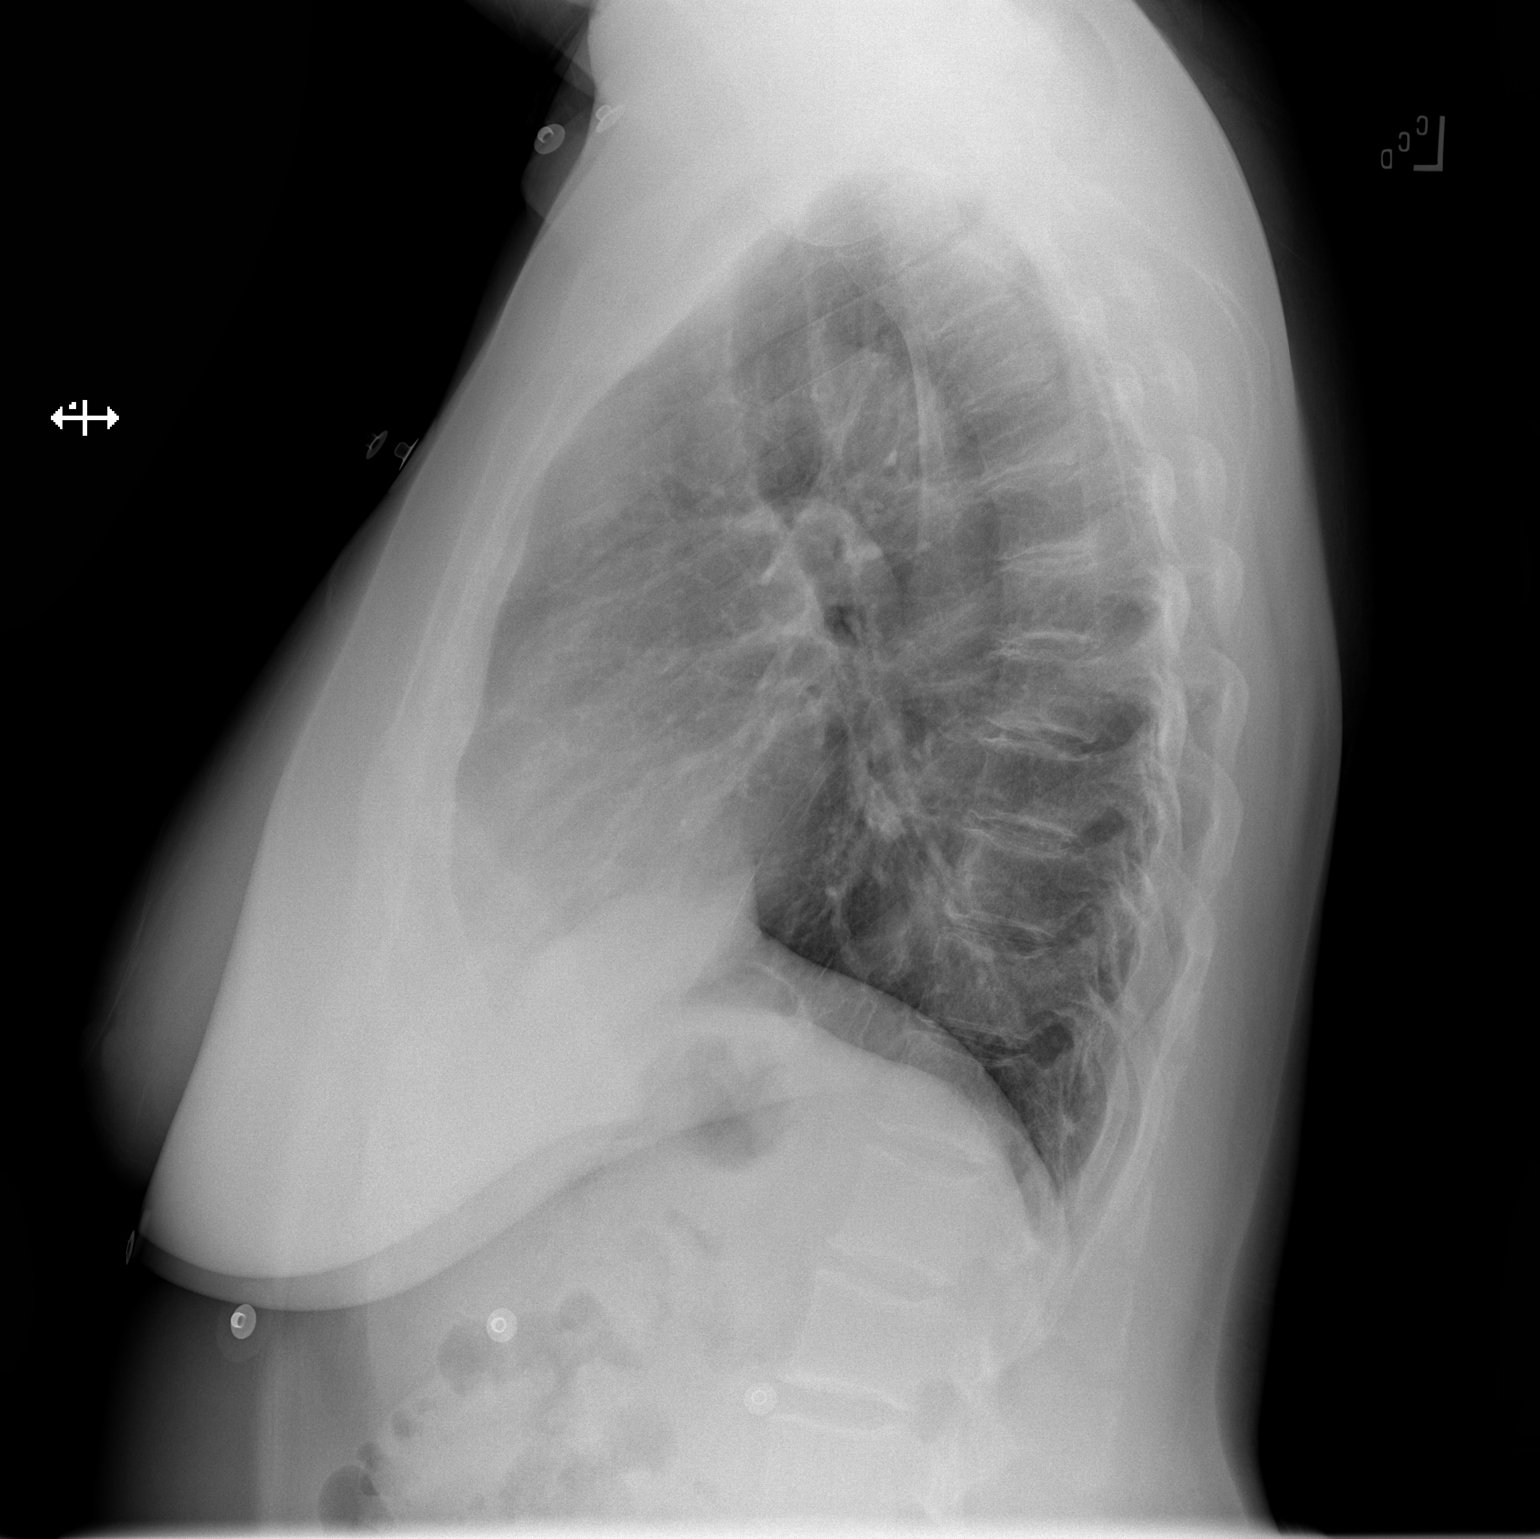

[2 of 2 positions shown; findings below may reference images not displayed]

FINDINGS: Stable cardiomediastinal silhouette with normal heart size. No
pneumothorax. No pleural effusion. Lungs appear clear, with no acute
consolidative airspace disease and no pulmonary edema.
IMPRESSION: No active cardiopulmonary disease.

## 2021-03-09 MED ORDER — SODIUM CHLORIDE (PF) 0.9 % IJ SOLN
INTRAMUSCULAR | Status: AC
  Filled 2021-03-09: qty 50

## 2021-03-09 MED ORDER — IOHEXOL 350 MG/ML SOLN
100.0000 mL | Freq: Once | INTRAVENOUS | Status: AC | PRN
  Administered 2021-03-09: 63 mL via INTRAVENOUS

## 2021-03-09 MED ORDER — KETOROLAC TROMETHAMINE 30 MG/ML IJ SOLN: 30.0000 mg | Freq: Once | INTRAMUSCULAR | Status: DC

## 2021-03-09 NOTE — ED Notes (Signed)
An After Visit Summary was printed and given to the patient. Discharge instructions given and no further questions at this time.  

## 2021-03-09 NOTE — ED Triage Notes (Addendum)
Pt complains of mid chest pain radiating to back starting 1 hour ago. Initially felt SOB but has resolved. Hx PE in 2009, is not on blood thinners.

## 2021-03-09 NOTE — ED Provider Notes (Addendum)
[facility]-EMERGENCY DEPT Provider Note   CSN: [id number] Arrival date & time: 03/09/21  1136     History Chief Complaint  Patient presents with   Chest Pain    Jasmine Watson is a 60 y.o. female.  Chief complaint of chest pain.  Described as mid chest pain that radiates to the back sharp and worse when he takes a deep breath.  She states he has a history of pulmonary embolism in the past but has not had symptoms like this for several years now.  Denies any fevers or cough.  No vomiting or diarrhea.  Denies fevers or cough.      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    Past Surgical History:  Procedure Laterality Date   TOTAL ABDOMINAL HYSTERECTOMY W/ BILATERAL SALPINGOOPHORECTOMY     secondary to fibroids     OB History     Gravida  3   Para  2   Term      Preterm      AB  1   Living         SAB      IAB  1   Ectopic      Multiple      Live Births              Family History  Problem Relation Age of Onset   Prostate cancer Father        Survivor   Diabetes Father    Hypertension Father    Cancer Brother        Renal cell carcinoma   Coronary artery disease Other    Colon cancer Neg Hx     Breast cancer Neg Hx    Stomach cancer Neg Hx     Social History   Tobacco Use   Smoking status: Never   Smokeless tobacco: Never  Vaping Use   Vaping Use: Never used  Substance Use Topics   Alcohol use: Yes    Alcohol/week: 3.0 standard drinks    Types: 3 Shots of liquor per week   Drug use: No    Home Medications Prior to Admission medications   Medication Sig Start Date End Date Taking? Authorizing Provider  ergocalciferol (VITAMIN D2) 1.25 MG (50000 UT) capsule ergocalciferol (vitamin D2) 1,250 mcg (50,000 unit) capsule  TAKE 1 CAPSULE BY MOUTH ONE TIME PER WEEK 28    [provider]  famotidine (PEPCID) 20 MG tablet Take 1 tablet (20 mg total) by mouth 2 (two) times daily. 01/04/21   Nche, Bonna Gains, NP  folic acid (FOLVITE) 1 MG tablet Take 1 mg by mouth daily.  10/15/17   [provider]  methotrexate (RHEUMATREX) 2.5 MG tablet Take 2.5 mg  by mouth once a week.  10/15/17   [provider]  metroNIDAZOLE (FLAGYL) 500 MG tablet metronidazole 500 mg tablet  TAKE 1 TABLET BY MOUTH TWICE A DAY    [provider]  predniSONE (DELTASONE) 20 MG tablet prednisone 20 mg tablet  TAKE 2 TABLETS (40 MG TOTAL) BY MOUTH DAILY WITH BREAKFAST.    [provider]  tretinoin (RETIN-A) 0.025 % cream tretinoin 0.025 % topical cream  APPLY TOPICALLY NIGHTLY. APPLY A THIN LAYER NIGHTLY AS TOLERATED.    [provider]  triamcinolone cream (KENALOG) 0.1 % Apply 1 application topically 2 (two) times daily. 02/21/21   Liliane Channel, NP  triamcinolone ointment (KENALOG) 0.1 % triamcinolone acetonide 0.1 % topical ointment  APPLY TOPICALLY 2 TIMES DAILY. AS NEEDED FOR [facility] AND IRRITATION. NEVER ON FACE OR GENITALS    [provider]    Allergies    Shellfish allergy, Clarithromycin, and Metronidazole  Review of Systems   Review of Systems  Constitutional:  Negative for fever.  HENT:  Negative for ear pain.   Eyes:  Negative for  pain.  Respiratory:  Negative for cough.   Cardiovascular:  Positive for chest pain.  Gastrointestinal:  Negative for abdominal pain.  Genitourinary:  Negative for flank pain.  Musculoskeletal:  Negative for back pain.  Skin:  Negative for rash.  Neurological:  Negative for headaches.   Physical Exam Updated Vital Signs BP (!) 161/98   Pulse 70   Temp 98 F (36.7 C) (Oral)   Resp 18   SpO2 99%   Physical Exam Constitutional:      General: She is not in acute distress.    Appearance: Normal appearance.  HENT:     Head: Normocephalic.     Nose: Nose normal.  Eyes:     Extraocular Movements: Extraocular movements intact.  Cardiovascular:     Rate and Rhythm: Normal rate.  Pulmonary:     Effort: Pulmonary effort is normal.  Musculoskeletal:        General: Normal range of motion.     Cervical back: Normal range of motion.  Neurological:     General: No focal deficit present.     Mental Status: She is alert. Mental status is at baseline.    ED Results / Procedures / Treatments   Labs (all labs ordered are listed, but only abnormal results are displayed) Labs Reviewed  BASIC METABOLIC PANEL - Abnormal; Notable for the following components:      Result Value   Glucose, Bld 103 (*)    All other components within normal limits  CBC  I-STAT BETA HCG BLOOD, ED ([facility], [facility], [facility] ONLY)  TROPONIN I (HIGH SENSITIVITY)  TROPONIN I (HIGH SENSITIVITY)    EKG EKG Interpretation  Date/Time:  Thursday March 09 2021 11:44:37 EDT Ventricular Rate:  78 PR Interval:  128 QRS Duration: 91 QT Interval:  383 QTC Calculation: 437 R Axis:   53 Text Interpretation: Sinus rhythm Probable left atrial enlargement 12 Lead; Mason-Likar Confirmed by Norman Clay ([id number]) on 03/09/2021 3:22:55 PM  Radiology DG Chest 2 View  Result Date: 03/09/2021 CLINICAL DATA:  Chest pain EXAM: CHEST - 2 VIEW COMPARISON:  06/21/2009 chest radiograph. FINDINGS: Stable cardiomediastinal silhouette with normal  heart size. No pneumothorax. No pleural effusion. Lungs appear clear, with no acute consolidative airspace disease and no pulmonary edema. IMPRESSION: No active cardiopulmonary disease. Electronically Signed   By: Delbert Phenix M.D.   On: 03/09/2021 12:23    Procedures  Procedures   Medications Ordered in ED Medications  iohexol (OMNIPAQUE) 350 MG/ML injection 100 mL (has no administration in time range)    ED Course  I have reviewed the triage vital signs and the nursing notes.  Pertinent labs & imaging results that were available during my care of the patient were reviewed by me and considered in my medical decision making (see chart for details).    MDM Rules/Calculators/A&P                           EKG shows sinus rhythm, normal rate, no ST elevations depressions noted.  Initial troponin is negative.  Given patient's history and recurrence of chest pain, CT angio pursued.   Final Clinical Impression(s) / ED Diagnoses Final diagnoses:  Chest pain, unspecified type    Rx / DC Orders ED Discharge Orders     None        Cheryll Cockayne, MD 03/09/21 1426    Cheryll Cockayne, MD 03/09/21 [phone]

## 2021-03-09 NOTE — ED Provider Notes (Addendum)
Emergency Medicine Provider Triage Evaluation Note  Jasmine Watson , a 60 y.o. female  was evaluated in triage.  Pt complains of CP. Began this morning.  Initial SOB however resolved. No cough. Radiates into back, worse with movement. Pain 8/10. Non pleuritic CP, no exertional CP. Lifts patients at work. No COVID exposures. Hx of unprovoked PE in 2009. Feels different. No anticoagulation.   Review of Systems  Positive: CP Negative: SOB, leg swelling, numbness, cough, le edema  Physical Exam  BP (!) 162/106 (BP Location: Left Arm)   Pulse 78   Temp 98 F (36.7 C) (Oral)   Resp 17   SpO2 98%  Gen:   Awake, no distress   Resp:  Normal effort  MSK:   Moves extremities without difficulty  Other:  2+ pulses bilaterally   Medical Decision Making  Medically screening exam initiated at 12:07 PM.  Appropriate orders placed.  Jasmine Watson was informed that the remainder of the evaluation will be completed by another provider, this initial triage assessment does not replace that evaluation, and the importance of remaining in the ED until their evaluation is complete.  CP     Tamika Nou A, PA-C 03/09/21 1210    Wynetta Fines, MD 03/10/21 [phone]

## 2021-03-09 NOTE — ED Notes (Signed)
Pt in restroom to give UA.  

## 2021-03-09 NOTE — ED Provider Notes (Signed)
  Physical Exam  BP (!) 159/86   Pulse 77   Temp 98 F (36.7 C) (Oral)   Resp 18   SpO2 99%   Physical Exam  ED Course/Procedures     Procedures  MDM  Jasmine Watson had a negative work-up for ACS, PE. Will be discharged home with close follow-up.       Koleen Distance, MD 03/09/21 [phone]

## 2021-04-03 ENCOUNTER — Other Ambulatory Visit: Payer: Self-pay

## 2021-04-03 ENCOUNTER — Encounter (HOSPITAL_COMMUNITY): Payer: Self-pay

## 2021-04-03 ENCOUNTER — Ambulatory Visit (INDEPENDENT_AMBULATORY_CARE_PROVIDER_SITE_OTHER): Payer: PRIVATE HEALTH INSURANCE

## 2021-04-03 ENCOUNTER — Ambulatory Visit (HOSPITAL_COMMUNITY)
Admission: EM | Admit: 2021-04-03 | Discharge: 2021-04-03 | Disposition: A | Discharge status: Home / Self Care | Payer: PRIVATE HEALTH INSURANCE | Attending: Family Medicine | Admitting: Family Medicine

## 2021-04-03 DIAGNOSIS — M25562 Pain in left knee: Secondary | ICD-10-CM

## 2021-04-03 DIAGNOSIS — H1031 Unspecified acute conjunctivitis, right eye: Secondary | ICD-10-CM

## 2021-04-03 IMAGING — DX DG KNEE COMPLETE 4+V*L*
4 series · 4 of 4 positions shown · non-contrast
Comparison: None.

CLINICAL DATA: Knee injury, pain, initial encounter.

EXAM:
LEFT KNEE - COMPLETE 4+ VIEW

[knee ap]
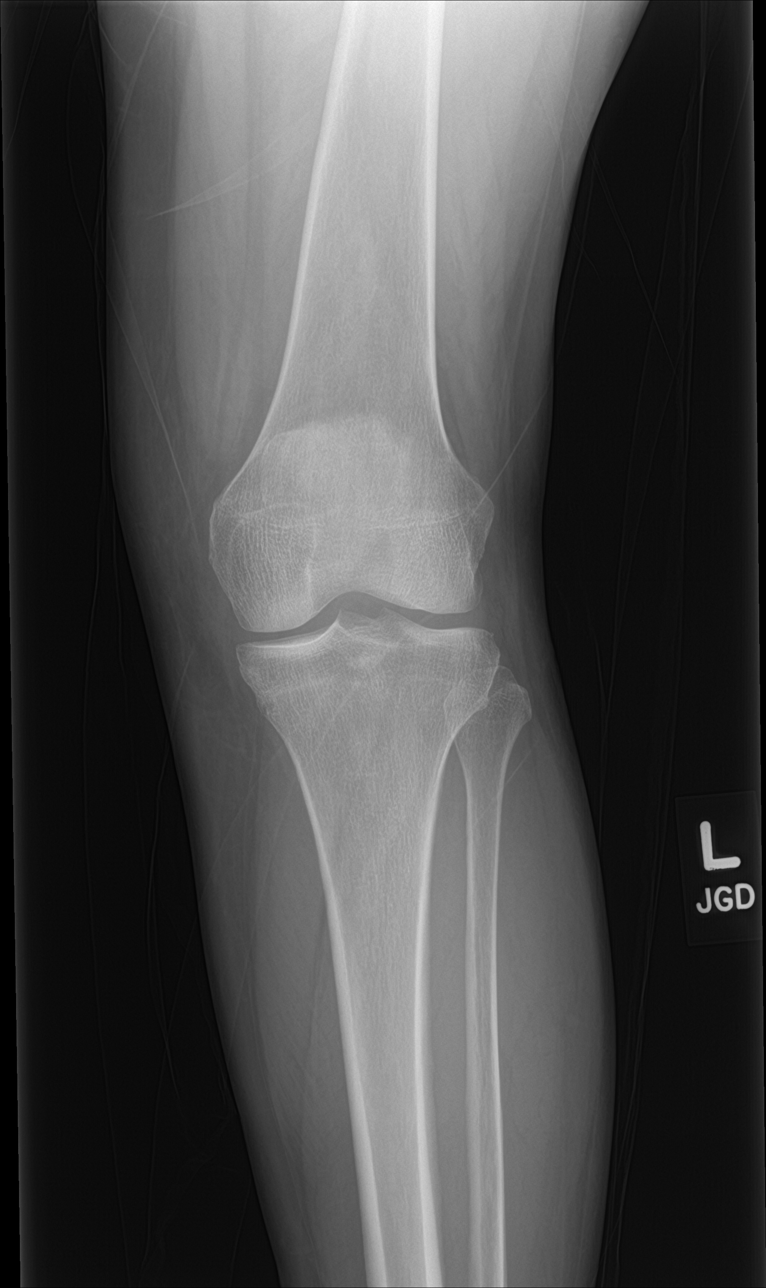

[knee obl (1 of 2)]
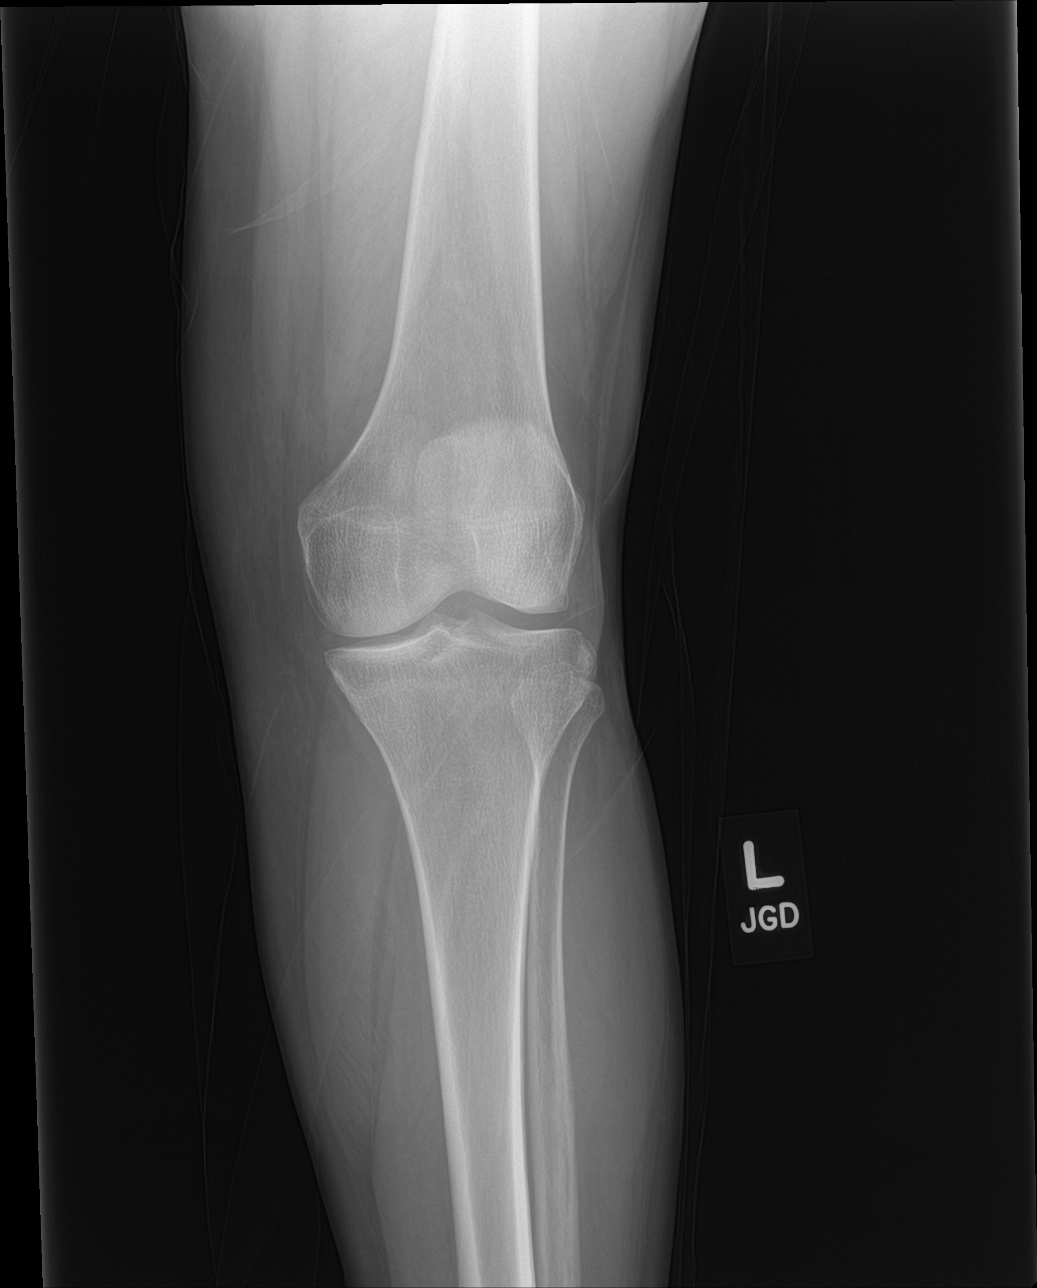

[knee obl (2 of 2)]
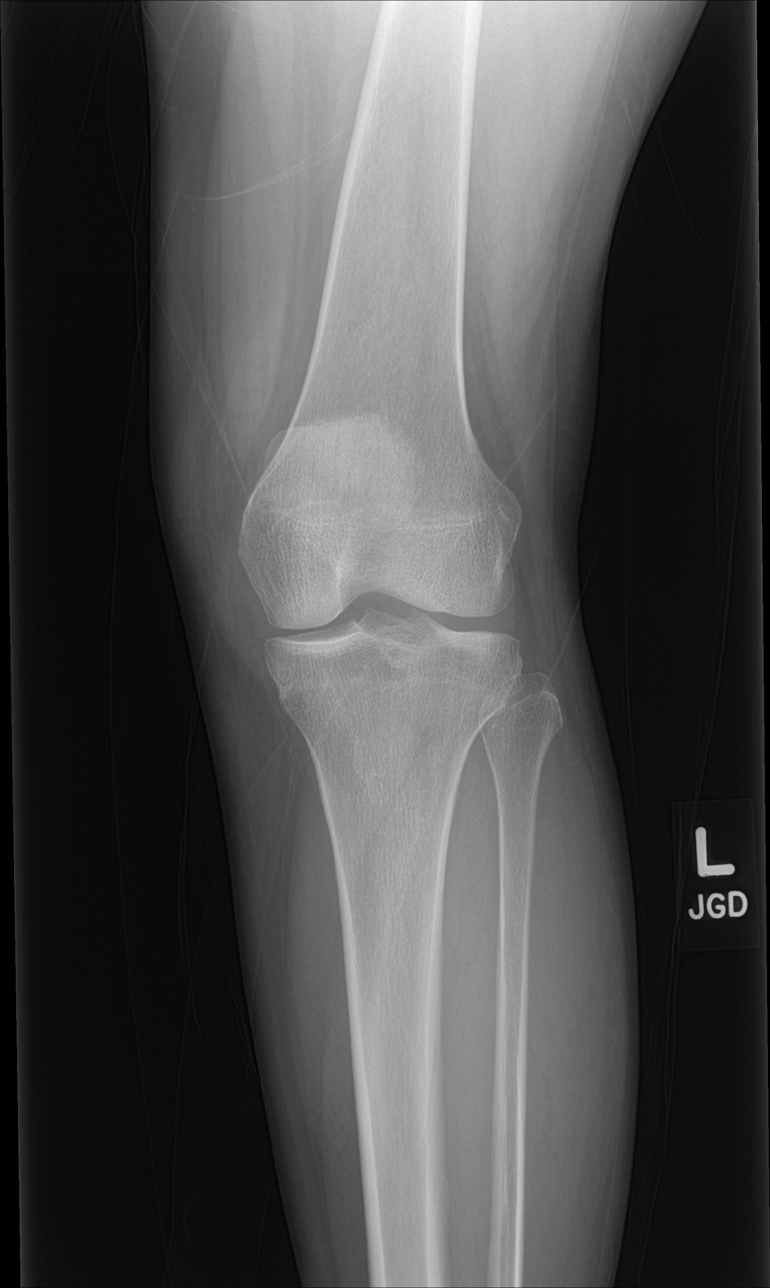

[knee lat]
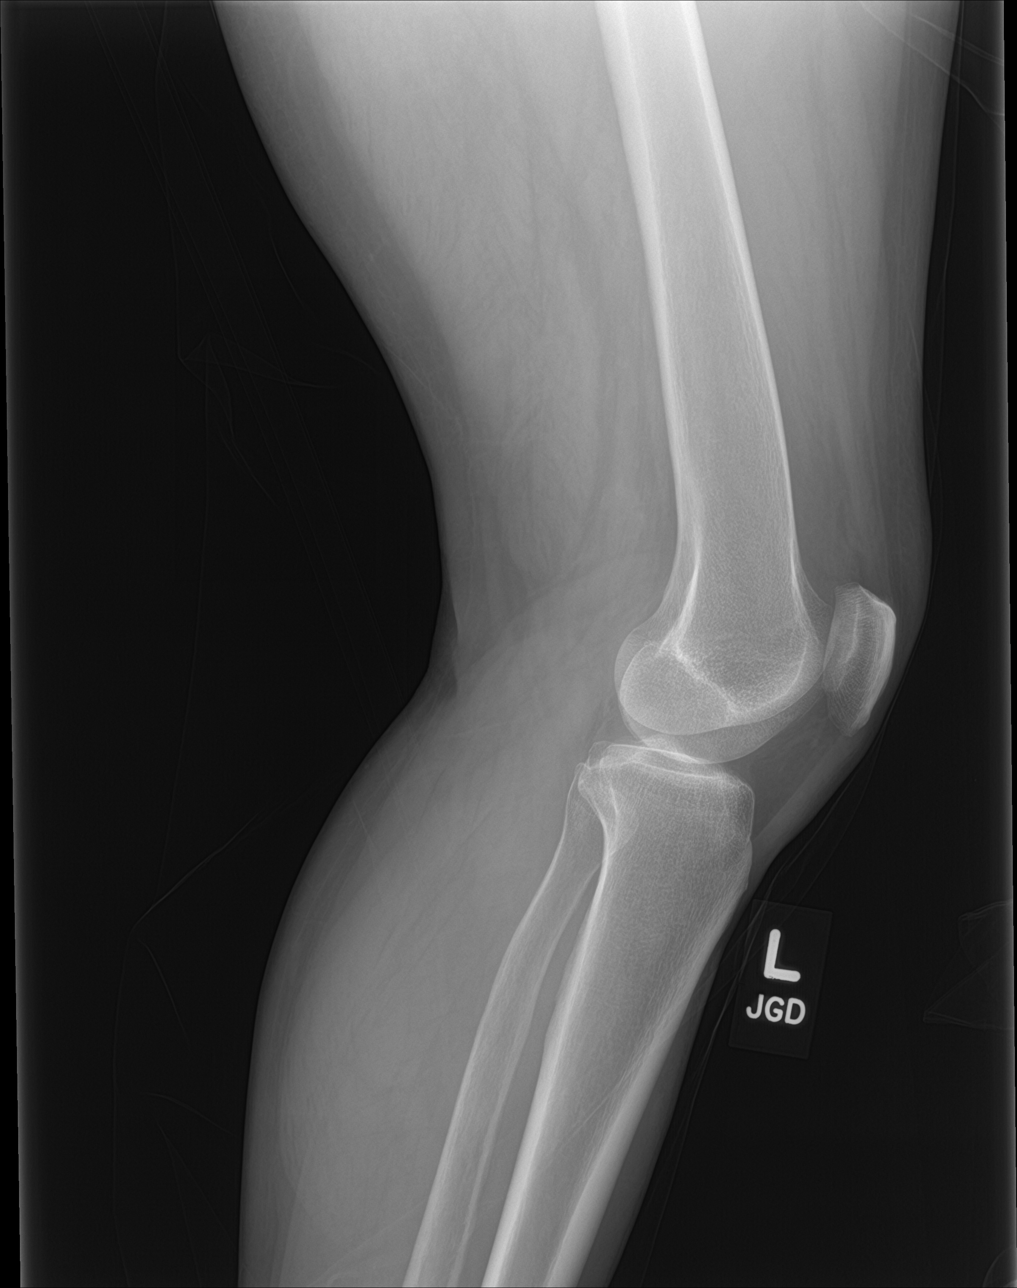

[4 of 4 positions shown; findings below may reference images not displayed]

FINDINGS: No acute osseous or joint abnormality.  No degenerative changes.
IMPRESSION: Negative.

## 2021-04-03 MED ORDER — OLOPATADINE HCL 0.2 % OP SOLN: 1.0000 [drp] | Freq: Every day | OPHTHALMIC | 0 refills | Status: DC

## 2021-04-03 MED ORDER — PREDNISONE 20 MG PO TABS: 40.0000 mg | ORAL_TABLET | Freq: Every day | ORAL | 0 refills | Status: DC

## 2021-04-03 NOTE — ED Provider Notes (Signed)
[facility]   [id number] 04/03/21 Arrival Time: 0959  ASSESSMENT & PLAN:  1. Acute pain of left knee   2. Acute conjunctivitis of right eye, unspecified acute conjunctivitis type    I have personally viewed the imaging studies ordered this visit. No knee bony abnormalities appreciated.  WBAT. Knee sleeve fitted. Likely allergic conjunctival L eye irritation. Discussed.  Begin: Meds ordered this encounter  Medications   predniSONE (DELTASONE) 20 MG tablet    Sig: Take 2 tablets (40 mg total) by mouth daily.    Dispense:  10 tablet    Refill:  0   Olopatadine HCl 0.2 % SOLN    Sig: Apply 1 drop to eye daily.    Dispense:  2.5 mL    Refill:  0    Orders Placed This Encounter  Procedures   DG Knee Complete 4 Views Left   Apply knee brace/sleeve    Recommend:  Follow-up Information     Nche, Bonna Gains, NP.   Specialty: Internal Medicine Why: As needed. Contact information: [address] [place] [address] [phone]         [facility].   Why: If your knee pain is worsening or failing to improve as anticipated. Contact information: [address] [place] [address] [phone]                Reviewed expectations re: course of current medical issues. Questions answered. Outlined signs and symptoms indicating need for more acute intervention. Patient verbalized understanding. After Visit Summary given.  SUBJECTIVE: History from: patient. Jasmine Watson is a 60 y.o. female who reports L knee pain; abrupt onset; few d ago; sitting on beach, rose to stand; noted ant/lat pain; sharp; now dull; worse with movements/standing. No knee trauma. Symptoms have progressed to a point and plateaued since beginning. No locking or giving out of knee. Extremity sensation changes or weakness: none. No tx PTA.  Past Surgical History:  Procedure Laterality Date   TOTAL ABDOMINAL  HYSTERECTOMY W/ BILATERAL SALPINGOOPHORECTOMY     secondary to fibroids     OBJECTIVE:  Vitals:   04/03/21 1031  BP: (!) 152/89  Pulse: 85  Resp: 18  Temp: 98.3 F (36.8 C)  TempSrc: Oral  SpO2: 98%    General appearance: alert; no distress HEENT: [place]; AT Neck: supple with FROM Resp: unlabored respirations Extremities: LLE: warm with well perfused appearance; poorly localized moderate tenderness over left ant/lat knee; without gross deformities; swelling: minimal; bruising: none; knee ROM: normal, with discomfort CV: brisk extremity capillary refill of LLE; 2+ DP pulse of LLE. Skin: warm and dry; no visible rashes Neurologic: normal sensation and strength of LLE Psychological: alert and cooperative; normal mood and affect  Imaging: DG Knee Complete 4 Views Left  Result Date: 04/03/2021 CLINICAL DATA:  Knee injury, pain, initial encounter. EXAM: LEFT KNEE - COMPLETE 4+ VIEW COMPARISON:  None. FINDINGS: No acute osseous or joint abnormality.  No degenerative changes. IMPRESSION: Negative. Electronically Signed   By: Leanna Battles M.D.   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   Lupus (HCC)    skin   Social History  Socioeconomic History   Marital status: Married    Spouse name: Not on file   Number of children: Not on file   Years of education: Not on file   Highest education level: Not on file  Occupational History   Occupation: Nursing ass't [employer]    Employer: [employer]  Tobacco Use   Smoking status: Never   Smokeless tobacco: Never  Vaping Use   Vaping Use: Never used  Substance and Sexual Activity   Alcohol use: Yes    Alcohol/week: 3.0 standard drinks    Types: 3 Shots of  liquor per week   Drug use: No   Sexual activity: Not on file  Other Topics Concern   Not on file  Social History Narrative   HSG, 1 semester college   Married '92   1 son - '93, 1 daughter - '98         Social Determinants of Corporate investment banker Strain: Not on Ship broker Insecurity: Not on file  Transportation Needs: Not on file  Physical Activity: Not on file  Stress: Not on file  Social Connections: Not on file   Family History  Problem Relation Age of Onset   Prostate cancer Father        Survivor   Diabetes Father    Hypertension Father    Cancer Brother        Renal cell carcinoma   Coronary artery disease Other    Colon cancer Neg Hx    Breast cancer Neg Hx    Stomach cancer Neg Hx    Past Surgical History:  Procedure Laterality Date   TOTAL ABDOMINAL HYSTERECTOMY W/ BILATERAL SALPINGOOPHORECTOMY     secondary to fibroids       Mardella Layman, MD 04/05/21 [phone]

## 2021-04-03 NOTE — ED Triage Notes (Signed)
Pt states she hurt knee at beach, and is having pain in left knee (2 days ago). Pt c/o redness to right eye with pain)

## 2021-05-09 ENCOUNTER — Encounter: Payer: Self-pay | Admitting: Family Medicine

## 2021-05-09 ENCOUNTER — Telehealth (INDEPENDENT_AMBULATORY_CARE_PROVIDER_SITE_OTHER): Payer: PRIVATE HEALTH INSURANCE | Admitting: Family Medicine

## 2021-05-09 DIAGNOSIS — U071 COVID-19: Secondary | ICD-10-CM

## 2021-05-09 MED ORDER — NIRMATRELVIR/RITONAVIR (PAXLOVID)TABLET: 3.0000 | ORAL_TABLET | Freq: Two times a day (BID) | ORAL | 0 refills | Status: AC

## 2021-05-09 MED ORDER — BENZONATATE 100 MG PO CAPS: 100.0000 mg | ORAL_CAPSULE | Freq: Three times a day (TID) | ORAL | 0 refills | Status: DC | PRN

## 2021-05-09 NOTE — Progress Notes (Signed)
Virtual Visit via Video Note  I connected with [place]  on 05/09/21 at  1:20 PM EDT by a video enabled telemedicine application and verified that I am speaking with the correct person using two identifiers.  Location patient: home, [place] Location provider:work or home office Persons participating in the virtual visit: patient, provider  I discussed the limitations of evaluation and management by telemedicine and the availability of in person appointments. The patient expressed understanding and agreed to proceed.   HPI:  Acute telemedicine visit for Covid19: -Onset: 2 days ago and tested positive on a PCR test -husband and son have covid as well -Symptoms include:  cough, headache, ears feel full, nasal congestion -Denies: fevers, CP, SOB, inability to eat/drink/get out of bed -Pertinent past medical history: -Pertinent medication allergies: Allergies  Allergen Reactions   Shellfish Allergy Anaphylaxis   Clarithromycin Rash   Metronidazole Rash  -COVID-19 vaccine status: 2 dose and 2 booster -GFR >60  ROS: See pertinent positives and negatives per HPI.  Past Medical History:  Diagnosis Date   Childhood asthma    History of chicken pox    History of pulmonary embolism 10/2007   Lichen planus pigmentosus 04/11/2016   Lupus (HCC)    skin    Past Surgical History:  Procedure Laterality Date   TOTAL ABDOMINAL HYSTERECTOMY W/ BILATERAL SALPINGOOPHORECTOMY     secondary to fibroids     Current Outpatient Medications:    benzonatate (TESSALON PERLES) 100 MG capsule, Take 1 capsule (100 mg total) by mouth 3 (three) times daily as needed., Disp: 20 capsule, Rfl: 0   famotidine (PEPCID) 20 MG tablet, Take 1 tablet (20 mg total) by mouth 2 (two) times daily., Disp: 60 tablet, Rfl: 5   folic acid (FOLVITE) 1 MG tablet, Take 1 mg by mouth daily. , Disp: , Rfl:    methotrexate (RHEUMATREX) 2.5 MG tablet, Take 2.5 mg by mouth once a week. , Disp: , Rfl:    nirmatrelvir/ritonavir EUA  (PAXLOVID) 20 x 150 MG & 10 x 100MG  TABS, Take 3 tablets by mouth 2 (two) times daily for 5 days. (Take nirmatrelvir 150 mg two tablets twice daily for 5 days and ritonavir 100 mg one tablet twice daily for 5 days) Patient GFR is >60, Disp: 30 tablet, Rfl: 0   triamcinolone cream (KENALOG) 0.1 %, Apply 1 application topically 2 (two) times daily., Disp: 30 g, Rfl: 1   triamcinolone ointment (KENALOG) 0.1 %, triamcinolone acetonide 0.1 % topical ointment  APPLY TOPICALLY 2 TIMES DAILY. AS NEEDED FOR [facility] AND IRRITATION. NEVER ON FACE OR GENITALS, Disp: , Rfl:   EXAM:  VITALS per patient if applicable: 98.9  GENERAL: alert, oriented, appears well and in no acute distress  HEENT: atraumatic, conjunttiva clear, no obvious abnormalities on inspection of external nose and ears  NECK: normal movements of the head and neck  LUNGS: on inspection no signs of respiratory distress, breathing rate appears normal, no obvious gross SOB, gasping or wheezing  CV: no obvious cyanosis  MS: moves all visible extremities without noticeable abnormality  PSYCH/NEURO: pleasant and cooperative, no obvious depression or anxiety, speech and thought processing grossly intact  ASSESSMENT AND PLAN:  Discussed the following assessment and plan:  COVID-19   Discussed treatment options (infusions and oral options and risk of drug interactions), ideal treatment window, potential complications, isolation and precautions for COVID-19.  Discussed possibility of rebound with antivirals and the need to reisolate if it should occur for 5 days. Checked for/reviewed any  labs done in the last 90 days with GFR listed in HPI if available. After lengthy discussion, the patient opted for treatment with Paxlovid due to being higher risk for complications of covid or severe disease and other factors. Discussed EUA status of this drug and the fact that there is preliminary limited knowledge of risks/interactions/side effects per EUA  document vs possible benefits and precautions. This information was shared with patient during the visit and also was provided in patient instructions. Also, advised that patient discuss risks/interactions and use with pharmacist/treatment team as well. The patient declined referral for Covid outpatient treatment at this time. The patient did want a prescription for cough, Tessalon Rx sent.  Other symptomatic care measures summarized in patient instructions. Work/School slipped offered: declined Advised to seek prompt in person care if worsening, new symptoms arise, or if is not improving with treatment. Discussed options for inperson care if PCP office not available. Did let this patient know that I only do telemedicine on Tuesdays and Thursdays for [place]. Advised to schedule follow up visit with PCP or [facility] if any further questions or concerns to avoid delays in care.   I discussed the assessment and treatment plan with the patient. The patient was provided an opportunity to ask questions and all were answered. The patient agreed with the plan and demonstrated an understanding of the instructions.     Terressa Koyanagi, DO

## 2021-05-09 NOTE — Patient Instructions (Addendum)
---------------------------------------------------------------------------------------------------------------------------    WORK SLIP:  Patient Jasmine Watson,  Jan 05, 1961, was seen for a medical visit today, 05/09/21 . Please excuse from work for a COVID like illness. We advise 10 days minimum from the onset of symptoms (05/07/21) PLUS 1 day of no fever and improved symptoms. Will defer to employer for a sooner return to work if symptoms have resolved, it is greater than 5 days since the positive test and the patient can wear a high-quality, tight fitting mask such as N95 or KN95 at all times for an additional 5 days. Would also suggest COVID19 antigen testing is negative prior to return.  Sincerely: E-signature: Dr. Colin Benton, DO [facility] [place] Ph: [phone]   ------------------------------------------------------------------------------------------------------------------------------   HOME CARE TIPS:   -I sent the medication(s) we discussed to your pharmacy: Meds ordered this encounter  Medications   nirmatrelvir/ritonavir EUA (PAXLOVID) 20 x 150 MG & 10 x 100MG TABS    Sig: Take 3 tablets by mouth 2 (two) times daily for 5 days. (Take nirmatrelvir 150 mg two tablets twice daily for 5 days and ritonavir 100 mg one tablet twice daily for 5 days) Patient GFR is >60    Dispense:  30 tablet    Refill:  0   benzonatate (TESSALON PERLES) 100 MG capsule    Sig: Take 1 capsule (100 mg total) by mouth 3 (three) times daily as needed.    Dispense:  20 capsule    Refill:  0     -I sent in the [place] treatment or referral you requested per our discussion. Please see the information provided below and discuss further with the pharmacist/treatment team.  -If taking Paxlovid, please review all medications, supplement and over the counter drugs with your pharmacist and ask them to check for any interactions. Please make the following changes to your regular  medications while taking Paxlovid:  -If taking Paxlovid, there is a chance of rebound illness after finishing your treatment. If you become sick again please isolate for an additional 5 days.    -can use tylenol if needed for fevers, aches and pains per instructions  -can use nasal saline a few times per day if you have nasal congestion  -stay hydrated, drink plenty of fluids and eat small healthy meals - avoid dairy  -follow up with your doctor in 2-3 days unless improving and feeling better  -stay home while sick, except to seek medical care. If you have COVID19, ideally it would be best to stay home for a full 10 days since the onset of symptoms PLUS one day of no fever and feeling better. Wear a good mask that fits snugly (such as N95 or KN95) if around others to reduce the risk of transmission.  It was nice to meet you today, and I really hope you are feeling better soon. I help [place] out with telemedicine visits on Tuesdays and Thursdays and am available for visits on those days. If you have any concerns or questions following this visit please schedule a follow up visit with your Primary Care doctor or seek care at a local urgent care clinic to avoid delays in care.    Seek in person care or schedule a follow up video visit promptly if your symptoms worsen, new concerns arise or you are not improving with treatment. Call 911 and/or seek emergency care if your symptoms are severe or life threatening.  FACT SHEET FOR PATIENTS, PARENTS, AND CAREGIVERS EMERGENCY USE AUTHORIZATION (  EUA) OF PAXLOVID FOR CORONAVIRUS DISEASE 2019 (COVID-19) You are being given this Fact Sheet because your healthcare provider believes it is necessary to provide you with PAXLOVID for the treatment of mild-to-moderate coronavirus disease (COVID-19) caused by the SARS-CoV-2 virus. This Fact Sheet contains information to help you understand the risks and benefits of taking the PAXLOVID you have received or  may receive. The U.S. Food and Drug Administration (FDA) has issued an Emergency Use Authorization (EUA) to make PAXLOVID available during the COVID-19 pandemic (for more details about an EUA please see "What is an Emergency Use Authorization?" at the end of this document). PAXLOVID is not an FDA-approved medicine in the [place]. Read this Fact Sheet for information about PAXLOVID. Talk to your healthcare provider about your options or if you have any questions. It is your choice to take PAXLOVID.  What is COVID-19? COVID-19 is caused by a virus called a coronavirus. You can get COVID-19 through close contact with another person who has the virus. COVID-19 illnesses have ranged from very mild-to-severe, including illness resulting in death. While information so far suggests that most COVID-19 illness is mild, serious illness can happen and may cause some of your other medical conditions to become worse. Older people and people of all ages with severe, long lasting (chronic) medical conditions like heart disease, lung disease, and diabetes, for example seem to be at higher risk of being hospitalized for COVID-19.  What is PAXLOVID? PAXLOVID is an investigational medicine used to treat mild-to-moderate COVID-19 in adults and children [69 years of age and older weighing at least 65 pounds (40 kg)] with positive results of direct SARS-CoV-2 viral testing, and who are at high risk for progression to severe COVID-19, including hospitalization or death. PAXLOVID is investigational because it is still being studied. There is limited information about the safety and effectiveness of using PAXLOVID to treat people with mild-to-moderate COVID-19.  The FDA has authorized the emergency use of PAXLOVID for the treatment of mild-tomoderate COVID-19 in adults and children [109 years of age and older weighing at least 77 pounds (38 kg)] with a positive test for the virus that causes COVID-19, and  who are at high risk for progression to severe COVID-19, including hospitalization or death, under an EUA. 1 Revised: 04 November 2020   What should I tell my healthcare provider before I take PAXLOVID? Tell your healthcare provider if you: ? Have any allergies ? Have liver or kidney disease ? Are pregnant or plan to become pregnant ? Are breastfeeding a child ? Have any serious illnesses  Tell your healthcare provider about all the medicines you take, including prescription and over-the-counter medicines, vitamins, and herbal supplements. Some medicines may interact with PAXLOVID and may cause serious side effects. Keep a list of your medicines to show your healthcare provider and pharmacist when you get a new medicine.  You can ask your healthcare provider or pharmacist for a list of medicines that interact with PAXLOVID. Do not start taking a new medicine without telling your healthcare provider. Your healthcare provider can tell you if it is safe to take PAXLOVID with other medicines.  Tell your healthcare provider if you are taking combined hormonal contraceptive. PAXLOVID may affect how your birth control pills work. Females who are able to become pregnant should use another effective alternative form of contraception or an additional barrier method of contraception. Talk to your healthcare provider if you have any questions about contraceptive methods that might be right for you.  How do I take PAXLOVID? ? PAXLOVID consists of 2 medicines: nirmatrelvir and ritonavir. o Take 2 pink tablets of nirmatrelvir with 1 white tablet of ritonavir by mouth 2 times each day (in the morning and in the evening) for 5 days. For each dose, take all 3 tablets at the same time. o If you have kidney disease, talk to your healthcare provider. You may need a different dose. ? Swallow the tablets whole. Do not chew, break, or crush the tablets. ? Take PAXLOVID with or without food. ? Do not stop  taking PAXLOVID without talking to your healthcare provider, even if you feel better. ? If you miss a dose of PAXLOVID within 8 hours of the time it is usually taken, take it as soon as you remember. If you miss a dose by more than 8 hours, skip the missed dose and take the next dose at your regular time. Do not take 2 doses of PAXLOVID at the same time. ? If you take too much PAXLOVID, call your healthcare provider or go to the nearest hospital emergency room right away. ? If you are taking a ritonavir- or cobicistat-containing medicine to treat hepatitis C or Human Immunodeficiency Virus (HIV), you should continue to take your medicine as prescribed by your healthcare provider. 2 Revised: 04 November 2020    Talk to your healthcare provider if you do not feel better or if you feel worse after 5 days.  Who should generally not take PAXLOVID? Do not take PAXLOVID if: ? You are allergic to nirmatrelvir, ritonavir, or any of the ingredients in PAXLOVID. ? You are taking any of the following medicines: o Alfuzosin o Pethidine, propoxyphene o Ranolazine o Amiodarone, dronedarone, flecainide, propafenone, quinidine o Colchicine o Lurasidone, pimozide, clozapine o Dihydroergotamine, ergotamine, methylergonovine o Lovastatin, simvastatin o Sildenafil (Revatio) for pulmonary arterial hypertension (PAH) o Triazolam, oral midazolam o Apalutamide o Carbamazepine, phenobarbital, phenytoin o Rifampin o St. John's Wort (hypericum perforatum) Taking PAXLOVID with these medicines may cause serious or life-threatening side effects or affect how PAXLOVID works.  These are not the only medicines that may cause serious side effects if taken with PAXLOVID. PAXLOVID may increase or decrease the levels of multiple other medicines. It is very important to tell your healthcare provider about all of the medicines you are taking because additional laboratory tests or changes in the dose of your  other medicines may be necessary while you are taking PAXLOVID. Your healthcare provider may also tell you about specific symptoms to watch out for that may indicate that you need to stop or decrease the dose of some of your other medicines.  What are the important possible side effects of PAXLOVID? Possible side effects of PAXLOVID are: ? Allergic Reactions. Allergic reactions can happen in people taking PAXLOVID, even after only 1 dose. Stop taking PAXLOVID and call your healthcare provider right away if you get any of the following symptoms of an allergic reaction: o hives o trouble swallowing or breathing o swelling of the mouth, lips, or face o throat tightness o hoarseness 3 Revised: 04 November 2020  o skin rash ? Liver Problems. Tell your healthcare provider right away if you have any of these signs and symptoms of liver problems: loss of appetite, yellowing of your skin and the whites of eyes (jaundice), dark-colored urine, pale colored stools and itchy skin, stomach area (abdominal) pain. ? Resistance to HIV Medicines. If you have untreated HIV infection, PAXLOVID may lead to some HIV medicines not working  as well in the future. ? Other possible side effects include: o altered sense of taste o diarrhea o high blood pressure o muscle aches These are not all the possible side effects of PAXLOVID. Not many people have taken PAXLOVID. Serious and unexpected side effects may happen. PAXLOVID is still being studied, so it is possible that all of the risks are not known at this time.  What other treatment choices are there? Veklury (remdesivir) is FDA-approved for the treatment of mild-to-moderate LJQGB-20 in certain adults and children. Talk with your doctor to see if Marijean Heath is appropriate for you. Like PAXLOVID, FDA may also allow for the emergency use of other medicines to treat people with COVID-19. Go to  https://price.info/ for information on the emergency use of other medicines that are authorized by FDA to treat people with COVID-19. Your healthcare provider may talk with you about clinical trials for which you may be eligible. It is your choice to be treated or not to be treated with PAXLOVID. Should you decide not to receive it or for your child not to receive it, it will not change your standard medical care.  What if I am pregnant or breastfeeding? There is no experience treating pregnant women or breastfeeding mothers with PAXLOVID. For a mother and unborn baby, the benefit of taking PAXLOVID may be greater than the risk from the treatment. If you are pregnant, discuss your options and specific situation with your healthcare provider. It is recommended that you use effective barrier contraception or do not have sexual activity while taking PAXLOVID. If you are breastfeeding, discuss your options and specific situation with your healthcare provider. 4 Revised: 04 November 2020   How do I report side effects with PAXLOVID? Contact your healthcare provider if you have any side effects that bother you or do not go away. Report side effects to FDA MedWatch at SmoothHits.hu or call 1-800-FDA1088 or you can report side effects to [employer]. at the contact information provided below. Website Fax number Telephone number www.pfizersafetyreporting.com [phone] How should I store [place]? Store PAXLOVID tablets at room temperature, between 68?F to 77?F (20?C to 25?C). How can I learn more about COVID-19? ? Ask your healthcare provider. ? Visit https://jacobson-johnson.com/. ? Contact your local or state public health department. What is an Emergency Use Authorization (EUA)? The [place] FDA has made PAXLOVID available under an emergency access mechanism  called an Emergency Use Authorization (EUA). The EUA is supported by a Education officer, museum and Human Service (HHS) declaration that circumstances exist to justify the emergency use of drugs and biological products during the COVID-19 pandemic. PAXLOVID for the treatment of mild-to-moderate COVID-19 in adults and children [31 years of age and older weighing at least 54 pounds (87 kg)] with positive results of direct SARS-CoV-2 viral testing, and who are at high risk for progression to severe COVID-19, including hospitalization or death, has not undergone the same type of review as an FDA-approved product. In issuing an EUA under the RAXEN-40 public health emergency, the FDA has determined, among other things, that based on the total amount of scientific evidence available including data from adequate and well-controlled clinical trials, if available, it is reasonable to believe that the product may be effective for diagnosing, treating, or preventing COVID-19, or a serious or life-threatening disease or condition caused by COVID-19; that the known and potential benefits of the product, when used to diagnose, treat, or prevent such disease or condition, outweigh the known and potential risks of  such product; and that there are no adequate, approved, and available alternatives. All of these criteria must be met to allow for the product to be used in the treatment of patients during the COVID-19 pandemic. The EUA for PAXLOVID is in effect for the duration of the COVID-19 declaration justifying emergency use of this product, unless terminated or revoked (after which the products may no longer be used under the EUA). 5 Revised: 04 November 2020     Additional Information For general questions, visit the website or call the telephone number provided below. Website Telephone number www.COVID19oralRx.com [phone] (1-877-C19-PACK) You can also go to www.pfizermedinfo.com or call  [phone] for more information. [id number] Revised: 04 November 2020

## 2022-05-01 ENCOUNTER — Ambulatory Visit: Payer: PRIVATE HEALTH INSURANCE | Admitting: Nurse Practitioner

## 2022-05-01 ENCOUNTER — Encounter: Payer: Self-pay | Admitting: Nurse Practitioner

## 2022-05-01 VITALS — BP 164/94 | HR 81

## 2022-05-01 DIAGNOSIS — R030 Elevated blood-pressure reading, without diagnosis of hypertension: Secondary | ICD-10-CM

## 2022-05-01 DIAGNOSIS — Z789 Other specified health status: Secondary | ICD-10-CM

## 2022-05-01 NOTE — Progress Notes (Signed)
Office Visit  Subjective:  Patient ID: Jasmine Watson, female    DOB: Jun 10, 1961  Age: 61 y.o. MRN: [id number]  CC: Wellness Exam  HPI Jasmine Watson presents for wellness exam visit for insurance benefit.  Patient has a PCP: Anne Ng NP at [facility].  PMH significant for: reports hx of elevated BP, not currently treated.  Last labs per PCP were completed: July 2022  Health Maintenance:  Colonoscopy: July 2014, repeat in 10 years. Mammogram: May 2023 Pap is up to date.     Smoker: Never   Immunizations:  Shingrix-  interested in this.  COVID- completed series plus 2 boosters.  Lifestyle: Diet-has lost some weight, eating less.  Exercise- no exercise.      Past Medical History:  Diagnosis Date   Childhood asthma    History of chicken pox    History of pulmonary embolism 10/2007   Lichen planus pigmentosus 04/11/2016   Lupus (HCC)    skin    Past Surgical History:  Procedure Laterality Date   TOTAL ABDOMINAL HYSTERECTOMY W/ BILATERAL SALPINGOOPHORECTOMY     secondary to fibroids    Outpatient Medications Prior to Visit  Medication Sig Dispense Refill   famotidine (PEPCID) 20 MG tablet Take 1 tablet (20 mg total) by mouth 2 (two) times daily. 60 tablet 5   folic acid (FOLVITE) 1 MG tablet Take 1 mg by mouth daily.      methotrexate (RHEUMATREX) 2.5 MG tablet Take 2.5 mg by mouth once a week.      triamcinolone cream (KENALOG) 0.1 % Apply 1 application topically 2 (two) times daily. 30 g 1   benzonatate (TESSALON PERLES) 100 MG capsule Take 1 capsule (100 mg total) by mouth 3 (three) times daily as needed. (Patient not taking: Reported on 05/01/2022) 20 capsule 0   triamcinolone ointment (KENALOG) 0.1 % triamcinolone acetonide 0.1 % topical ointment  APPLY TOPICALLY 2 TIMES DAILY. AS NEEDED FOR [facility] AND IRRITATION. NEVER ON FACE OR GENITALS     No facility-administered medications prior to visit.    ROS Review of Systems  Respiratory:  Negative for  shortness of breath (sometimes.).   Cardiovascular:  Negative for chest pain and leg swelling.  Gastrointestinal:  Positive for abdominal pain (acid reflux at times.).  Musculoskeletal:  Positive for myalgias.  Neurological:  Negative for headaches.    Objective:  BP (!) 150/92   Pulse 81   SpO2 100%   Physical Exam Constitutional:      General: She is not in acute distress. HENT:     Head: Normocephalic.  Cardiovascular:     Rate and Rhythm: Normal rate and regular rhythm.     Pulses: Normal pulses.     Heart sounds: Normal heart sounds.  Pulmonary:     Effort: Pulmonary effort is normal.     Breath sounds: Normal breath sounds.  Musculoskeletal:        General: Normal range of motion.     Right lower leg: No edema.     Left lower leg: No edema.  Neurological:     General: No focal deficit present.     Mental Status: She is alert and oriented to person, place, and time.  Psychiatric:        Mood and Affect: Mood normal.        Behavior: Behavior normal.      Assessment & Plan:    Deloria was seen today for wellness exam.  Diagnoses and all orders for  this visit:  Participant in health and wellness plan Adult wellness physical was conducted today. Importance of diet and exercise were discussed in detail. Recommended low sodium diet.  We reviewed immunizations and gave recommendations regarding current immunization needed for age. Patient is interested in Shingrix vaccine. Will order this.  Preventative health exams are up to date.   Patient was advised yearly wellness exam.   Elevated BP without diagnosis of hypertension BP is elevated today. Patient reports BP has been up and down in the past, never treated. Asymptomatic at this time but hesitant to start treatment today.  Discussed starting BP log today. Patient will check BP twice daily and bring BP log to follow-up scheduled next week. Discussed avoiding high sodium foods and encouraged increase water intake.  Discussed when to seek care.    No orders of the defined types were placed in this encounter.   No orders of the defined types were placed in this encounter.   Follow-up: in 1 week

## 2022-05-10 ENCOUNTER — Ambulatory Visit: Payer: PRIVATE HEALTH INSURANCE | Admitting: Nurse Practitioner

## 2022-05-10 ENCOUNTER — Encounter: Payer: Self-pay | Admitting: Nurse Practitioner

## 2022-05-10 VITALS — BP 170/100

## 2022-05-10 DIAGNOSIS — I10 Essential (primary) hypertension: Secondary | ICD-10-CM

## 2022-05-10 MED ORDER — LOSARTAN POTASSIUM 25 MG PO TABS: 25.0000 mg | ORAL_TABLET | Freq: Every day | ORAL | 1 refills | Status: DC

## 2022-05-10 NOTE — Progress Notes (Signed)
Office Visit  Subjective:     Patient ID: Jasmine Watson, female    DOB: April 12, 1961, 61 y.o.   MRN: [id number]  Chief Complaint  Patient presents with   Hypertension    HPI Patient is in today for BP recheck. During wellness exam on 05/01/2022, BP was 150/92. Patient denied hx of HTN and was not on any antihypertensives. Patient presents with BP log.  BP log as follows: 9/13: 186/106 in the am, 158/96 in the pm. 9/14: 144/102 in the am, 165/96 in the pm.  9/15: 164/106 in the am, 160/102 in the pm. 9/16: 157/102 9/18: 185/106 9/19: 161/96 9/20: 180/114  Patient admits to some headaches but denies chest pain, SOB, or BLE. She reports that a few weeks ago, she had left arm pain and tingling of fingers. She has not experienced this since.  Review of Systems  Respiratory:  Negative for shortness of breath.   Cardiovascular:  Negative for chest pain and leg swelling.  Musculoskeletal:  Positive for myalgias.        Objective:    BP (!) 170/100  BP Readings from Last 3 Encounters:  05/10/22 (!) 170/100  05/01/22 (!) 164/94  04/03/21 (!) 152/89    Physical Exam Constitutional:      General: She is not in acute distress. Cardiovascular:     Rate and Rhythm: Normal rate and regular rhythm.     Heart sounds: Normal heart sounds.  Pulmonary:     Effort: Pulmonary effort is normal.     Breath sounds: Normal breath sounds.  Musculoskeletal:        General: Normal range of motion.     Right lower leg: No edema.     Left lower leg: No edema.  Neurological:     General: No focal deficit present.     Mental Status: She is alert and oriented to person, place, and time.     No results found for any visits on 05/10/22.      Assessment & Plan:   Problem List Items Addressed This Visit   None Visit Diagnoses     Primary hypertension    -  Primary Discussed several treatments options with patient. She would like to avoid HCTZ due to ongoing muscular pain/cramping  (currently on methotrexate for this). Also wants to avoid Ace-Inhibitors due to fears for possible angioedema. She has opted to start Losartan. Last BMP (01/23) was stable.  I have discussed importance of follow-up, encouraged scheduling appointment with PCP for further management of HTN. Encouraged DASH diet.   Patient will reach out to me in 1 week to follow-up on BP readings and status of appt with her PCP.    Relevant Medications   losartan (COZAAR) 25 MG tablet       Meds ordered this encounter  Medications   losartan (COZAAR) 25 MG tablet    Sig: Take 1 tablet (25 mg total) by mouth daily.    Dispense:  30 tablet    Refill:  1     Lurena Joiner, NP

## 2022-05-22 ENCOUNTER — Telehealth: Payer: Self-pay | Admitting: Nurse Practitioner

## 2022-05-22 NOTE — Telephone Encounter (Signed)
Called patient to follow-up on BP readings at home. Patient reports BP as remained about the same with initiating of losartan. She reports she as an appointment with her PCP tomorrow for further management.   Patient also reports she would like to defer shingrix vaccine until a later date. She plans to discuss this with her PCP as well tomorrow.

## 2022-05-23 ENCOUNTER — Encounter: Payer: Self-pay | Admitting: Nurse Practitioner

## 2022-05-23 ENCOUNTER — Ambulatory Visit (INDEPENDENT_AMBULATORY_CARE_PROVIDER_SITE_OTHER): Payer: PRIVATE HEALTH INSURANCE | Admitting: Nurse Practitioner

## 2022-05-23 VITALS — BP 160/98 | HR 68 | Temp 97.9°F | Ht 64.0 in | Wt 175.2 lb

## 2022-05-23 DIAGNOSIS — I10 Essential (primary) hypertension: Secondary | ICD-10-CM | POA: Diagnosis not present

## 2022-05-23 DIAGNOSIS — Z13220 Encounter for screening for lipoid disorders: Secondary | ICD-10-CM | POA: Diagnosis not present

## 2022-05-23 DIAGNOSIS — Z136 Encounter for screening for cardiovascular disorders: Secondary | ICD-10-CM | POA: Diagnosis not present

## 2022-05-23 LAB — COMPREHENSIVE METABOLIC PANEL
ALT: 44 U/L — ABNORMAL HIGH (ref 0–35)
AST: 36 U/L (ref 0–37)
Albumin: 4 g/dL (ref 3.5–5.2)
Alkaline Phosphatase: 57 U/L (ref 39–117)
BUN: 15 mg/dL (ref 6–23)
CO2: 28 mEq/L (ref 19–32)
Calcium: 9.2 mg/dL (ref 8.4–10.5)
Chloride: 105 mEq/L (ref 96–112)
Creatinine, Ser: 0.78 mg/dL (ref 0.40–1.20)
GFR: 82.24 mL/min (ref >60.00)
Glucose, Bld: 86 mg/dL (ref 70–99)
Potassium: 3.7 mEq/L (ref 3.5–5.1)
Sodium: 139 mEq/L (ref 135–145)
Total Bilirubin: 1.1 mg/dL (ref 0.2–1.2)
Total Protein: 7.2 g/dL (ref 6.0–8.3)

## 2022-05-23 LAB — TSH: TSH: 0.74 u[IU]/mL (ref 0.35–5.50)

## 2022-05-23 MED ORDER — LOSARTAN POTASSIUM 50 MG PO TABS: 50.0000 mg | ORAL_TABLET | Freq: Every day | ORAL | 1 refills | Status: DC

## 2022-05-23 NOTE — Patient Instructions (Addendum)
Go to lab Maintain DASH diet Minimize alcohol consumption. Increase losartan to 50mg  daily F/up in 27month.  Hypertension, Adult Hypertension is another name for high blood pressure. High blood pressure forces your heart to work harder to pump blood. This can cause problems over time. There are two numbers in a blood pressure reading. There is a top number (systolic) over a bottom number (diastolic). It is best to have a blood pressure that is below 120/80 What are the causes? The cause of this condition is not known. Some other conditions can lead to high blood pressure. What increases the risk? Some lifestyle factors can make you more likely to develop high blood pressure: Smoking. Not getting enough exercise or physical activity. Being overweight. Having too much fat, sugar, calories, or salt (sodium) in your diet. Drinking too much alcohol. Other risk factors include: Having any of these conditions: Heart disease. Diabetes. High cholesterol. Kidney disease. Obstructive sleep apnea. Having a family history of high blood pressure and high cholesterol. Age. The risk increases with age. Stress. What are the signs or symptoms? High blood pressure may not cause symptoms. Very high blood pressure (hypertensive crisis) may cause: Headache. Fast or uneven heartbeats (palpitations). Shortness of breath. Nosebleed. Vomiting or feeling like you may vomit (nauseous). Changes in how you see. Very bad chest pain. Feeling dizzy. Seizures. How is this treated? This condition is treated by making healthy lifestyle changes, such as: Eating healthy foods. Exercising more. Drinking less alcohol. Your doctor may prescribe medicine if lifestyle changes do not help enough and if: Your top number is above 130. Your bottom number is above 80. Your personal target blood pressure may vary. Follow these instructions at home: Eating and drinking  If told, follow the DASH eating plan. To  follow this plan: Fill one half of your plate at each meal with fruits and vegetables. Fill one fourth of your plate at each meal with whole grains. Whole grains include whole-wheat pasta, brown rice, and whole-grain bread. Eat or drink low-fat dairy products, such as skim milk or low-fat yogurt. Fill one fourth of your plate at each meal with low-fat (lean) proteins. Low-fat proteins include fish, chicken without skin, eggs, beans, and tofu. Avoid fatty meat, cured and processed meat, or chicken with skin. Avoid pre-made or processed food. Limit the amount of salt in your diet to less than 1,500 mg each day. Do not drink alcohol if: Your doctor tells you not to drink. You are pregnant, may be pregnant, or are planning to become pregnant. If you drink alcohol: Limit how much you have to: 0-1 drink a day for women. 0-2 drinks a day for men. Know how much alcohol is in your drink. In the [place] one drink equals one 12 oz bottle of beer (355 mL), one 5 oz glass of wine (148 mL), or one 1 oz glass of hard liquor (44 mL). Lifestyle  Work with your doctor to stay at a healthy weight or to lose weight. Ask your doctor what the best weight is for you. Get at least 30 minutes of exercise that causes your heart to beat faster (aerobic exercise) most days of the week. This may include walking, swimming, or biking. Get at least 30 minutes of exercise that strengthens your muscles (resistance exercise) at least 3 days a week. This may include lifting weights or doing Pilates. Do not smoke or use any products that contain nicotine or tobacco. If you need help quitting, ask your doctor. Check your blood  pressure at home as told by your doctor. Keep all follow-up visits. Medicines Take over-the-counter and prescription medicines only as told by your doctor. Follow directions carefully. Do not skip doses of blood pressure medicine. The medicine does not work as well if you skip doses. Skipping doses also  puts you at risk for problems. Ask your doctor about side effects or reactions to medicines that you should watch for. Contact a doctor if: You think you are having a reaction to the medicine you are taking. You have headaches that keep coming back. You feel dizzy. You have swelling in your ankles. You have trouble with your vision. Get help right away if: You get a very bad headache. You start to feel mixed up (confused). You feel weak or numb. You feel faint. You have very bad pain in your: Chest. Belly (abdomen). You vomit more than once. You have trouble breathing. These symptoms may be an emergency. Get help right away. Call 911. Do not wait to see if the symptoms will go away. Do not drive yourself to the hospital. Summary Hypertension is another name for high blood pressure. High blood pressure forces your heart to work harder to pump blood. For most people, a normal blood pressure is less than 120/80. Making healthy choices can help lower blood pressure. If your blood pressure does not get lower with healthy choices, you may need to take medicine. This information is not intended to replace advice given to you by your health care provider. Make sure you discuss any questions you have with your health care provider. Document Revised: 05/25/2021 Document Reviewed: 05/25/2021 Elsevier Patient Education  [place].

## 2022-05-23 NOTE — Assessment & Plan Note (Signed)
Associated with headache and dizziness. Started losartan 25mg  1.5week ago, denies any adverse effects. BP Readings from Last 3 Encounters:  05/23/22 (!) 160/98  05/10/22 (!) 170/100  05/01/22 (!) 164/94   Advised about possible complications of uncontrolled HTN, importance of DASH diet and minimizing ETOH consumption. Increased losartan to 50mg  Check CBC, CMP, TSH F/up in 11month

## 2022-05-23 NOTE — Progress Notes (Signed)
Established Patient Visit  Patient: Jasmine Watson   DOB: 11-23-1960   61 y.o. Female  MRN: [id number] Visit Date: 05/23/2022  Subjective:    Chief Complaint  Patient presents with   Office Visit    HTN F/u Checks BP three times a week, says it's still running high Pt not fasting No concerns    HPI History of pulmonary embolism Repeat CTA chest 02/2021: negative  History of Helicobacter pylori infection Negative stool titer 2021  Primary hypertension Associated with headache and dizziness. Started losartan 25mg  1.5week ago, denies any adverse effects. BP Readings from Last 3 Encounters:  05/23/22 (!) 160/98  05/10/22 (!) 170/100  05/01/22 (!) 164/94   Advised about possible complications of uncontrolled HTN, importance of DASH diet and minimizing ETOH consumption. Increased losartan to 50mg  Check CBC, CMP, TSH F/up in 58month  Reviewed medical, surgical, and social history today Social History   Socioeconomic History   Marital status: Married    Spouse name: Not on file   Number of children: 2   Years of education: Not on file   Highest education level: Not on file  Occupational History   Occupation: Nursing ass't [employer]    Employer: [employer]  Tobacco Use   Smoking status: Never   Smokeless tobacco: Never  Vaping Use   Vaping Use: Never used  Substance and Sexual Activity   Alcohol use: Yes    Alcohol/week: 6.0 standard drinks of alcohol    Types: 6 Shots of liquor per week   Drug use: No   Sexual activity: Not on file  Other Topics Concern   Not on file  Social History Narrative   HSG, 1 semester college   Married '92   1 son - '93, 1 daughter - '98         Social Determinants of Radio broadcast assistant Strain: Not on Art therapist Insecurity: Not on file  Transportation Needs: Not on file  Physical Activity: Not on file  Stress: Not on file  Social Connections: Not on file  Intimate Partner Violence: Not on  file    Family History  Problem Relation Age of Onset   Prostate cancer Father        Survivor   Diabetes Father    Hypertension Father    Hypertension Sister    Cancer Brother        Renal cell carcinoma   Coronary artery disease Other    Colon cancer Neg Hx    Breast cancer Neg Hx    Stomach cancer Neg Hx     Medications: Outpatient Medications Prior to Visit  Medication Sig   famotidine (PEPCID) 20 MG tablet Take 1 tablet (20 mg total) by mouth 2 (two) times daily.   folic acid (FOLVITE) 1 MG tablet Take 1 mg by mouth daily.    methotrexate (RHEUMATREX) 2.5 MG tablet Take 2.5 mg by mouth once a week.    [DISCONTINUED] losartan (COZAAR) 25 MG tablet Take 1 tablet (25 mg total) by mouth daily.   [DISCONTINUED] benzonatate (TESSALON PERLES) 100 MG capsule Take 1 capsule (100 mg total) by mouth 3 (three) times daily as needed. (Patient not taking: Reported on 05/01/2022)   [DISCONTINUED] triamcinolone cream (KENALOG) 0.1 % Apply 1 application topically 2 (two) times daily. (Patient not taking: Reported on 05/23/2022)   No facility-administered medications prior to visit.   Reviewed  past medical and social history.   ROS per HPI above      Objective:  BP (!) 160/98 (BP Location: Left Arm, Patient Position: Sitting, Cuff Size: Normal)   Pulse 68   Temp 97.9 F (36.6 C) (Temporal)   Ht 5\' 4"  (1.626 m)   Wt 175 lb 3.2 oz (79.5 kg)   SpO2 95%   BMI 30.07 kg/m      Physical Exam Cardiovascular:     Rate and Rhythm: Normal rate.     Pulses: Normal pulses.  Pulmonary:     Effort: Pulmonary effort is normal.  Musculoskeletal:     Right lower leg: No edema.     Left lower leg: No edema.  Neurological:     Mental Status: She is alert and oriented to person, place, and time.  Psychiatric:        Mood and Affect: Mood normal.        Behavior: Behavior normal.        Thought Content: Thought content normal.     No results found for any visits on 05/23/22.     Assessment & Plan:    Problem List Items Addressed This Visit       Cardiovascular and Mediastinum   Primary hypertension    Associated with headache and dizziness. Started losartan 25mg  1.5week ago, denies any adverse effects. BP Readings from Last 3 Encounters:  05/23/22 (!) 160/98  05/10/22 (!) 170/100  05/01/22 (!) 164/94   Advised about possible complications of uncontrolled HTN, importance of DASH diet and minimizing ETOH consumption. Increased losartan to 50mg  Check CBC, CMP, TSH F/up in 90month      Relevant Medications   losartan (COZAAR) 50 MG tablet   Other Relevant Orders   TSH   Comprehensive metabolic panel   Other Visit Diagnoses     Encounter for lipid screening for cardiovascular disease    -  Primary      Return in about 4 weeks (around 06/20/2022) for HTN.     , NP

## 2022-05-23 NOTE — Assessment & Plan Note (Signed)
Repeat CTA chest 02/2021: negative

## 2022-05-23 NOTE — Assessment & Plan Note (Addendum)
Negative stool titer 2021

## 2022-05-29 ENCOUNTER — Telehealth: Payer: Self-pay

## 2022-05-29 NOTE — Telephone Encounter (Signed)
LVM for pt notifying of recent lab results. Instructed to call if need anything or have questions.

## 2022-06-20 ENCOUNTER — Ambulatory Visit (INDEPENDENT_AMBULATORY_CARE_PROVIDER_SITE_OTHER): Payer: PRIVATE HEALTH INSURANCE | Admitting: Nurse Practitioner

## 2022-06-20 ENCOUNTER — Encounter: Payer: Self-pay | Admitting: Nurse Practitioner

## 2022-06-20 VITALS — BP 170/100 | HR 75 | Temp 97.0°F | Ht 64.0 in | Wt 176.2 lb

## 2022-06-20 DIAGNOSIS — I10 Essential (primary) hypertension: Secondary | ICD-10-CM

## 2022-06-20 MED ORDER — AMLODIPINE BESYLATE 5 MG PO TABS: 5.0000 mg | ORAL_TABLET | Freq: Every evening | ORAL | 1 refills | Status: DC

## 2022-06-20 MED ORDER — LOSARTAN POTASSIUM 100 MG PO TABS: 100.0000 mg | ORAL_TABLET | Freq: Every morning | ORAL | 3 refills | Status: DC

## 2022-06-20 NOTE — Assessment & Plan Note (Signed)
BP not at goal Home BP reading: 150s-170s/90s-100s Has not made any lifestyle modifications BP Readings from Last 3 Encounters:  06/20/22 (!) 170/100  05/23/22 (!) 160/98  05/10/22 (!) 170/100   Increased losartan to 100mg  in AM and add amlodipine 5mg  in PM Advised about the importance of DASH diet and minimizing ETOh consumption. F/up in 35month

## 2022-06-20 NOTE — Patient Instructions (Addendum)
Increase losartan dose to 100mg  in AM Start amlodipine 5mg  in PM Maintain DASH diet and minimize alcohol consumption Continue to monitor BP every other day  DASH Eating Plan DASH stands for Dietary Approaches to Stop Hypertension. The DASH eating plan is a healthy eating plan that has been shown to: Reduce high blood pressure (hypertension). Reduce your risk for type 2 diabetes, heart disease, and stroke. Help with weight loss. What are tips for following this plan? Reading food labels Check food labels for the amount of salt (sodium) per serving. Choose foods with less than 5 percent of the Daily Value of sodium. Generally, foods with less than 300 milligrams (mg) of sodium per serving fit into this eating plan. To find whole grains, look for the word "whole" as the first word in the ingredient list. Shopping Buy products labeled as "low-sodium" or "no salt added." Buy fresh foods. Avoid canned foods and pre-made or frozen meals. Cooking Avoid adding salt when cooking. Use salt-free seasonings or herbs instead of table salt or sea salt. Check with your health care provider or pharmacist before using salt substitutes. Do not fry foods. Cook foods using healthy methods such as baking, boiling, grilling, roasting, and broiling instead. Cook with heart-healthy oils, such as olive, canola, avocado, soybean, or sunflower oil. Meal planning  Eat a balanced diet that includes: 4 or more servings of fruits and 4 or more servings of vegetables each day. Try to fill one-half of your plate with fruits and vegetables. 6-8 servings of whole grains each day. Less than 6 oz (170 g) of lean meat, poultry, or fish each day. A 3-oz (85-g) serving of meat is about the same size as a deck of cards. One egg equals 1 oz (28 g). 2-3 servings of low-fat dairy each day. One serving is 1 cup (237 mL). 1 serving of nuts, seeds, or beans 5 times each week. 2-3 servings of heart-healthy fats. Healthy fats called  omega-3 fatty acids are found in foods such as walnuts, flaxseeds, fortified milks, and eggs. These fats are also found in cold-water fish, such as sardines, salmon, and mackerel. Limit how much you eat of: Canned or prepackaged foods. Food that is high in trans fat, such as some fried foods. Food that is high in saturated fat, such as fatty meat. Desserts and other sweets, sugary drinks, and other foods with added sugar. Full-fat dairy products. Do not salt foods before eating. Do not eat more than 4 egg yolks a week. Try to eat at least 2 vegetarian meals a week. Eat more home-cooked food and less restaurant, buffet, and fast food. Lifestyle When eating at a restaurant, ask that your food be prepared with less salt or no salt, if possible. If you drink alcohol: Limit how much you use to: 0-1 drink a day for women who are not pregnant. 0-2 drinks a day for men. Be aware of how much alcohol is in your drink. In the [place] one drink equals one 12 oz bottle of beer (355 mL), one 5 oz glass of wine (148 mL), or one 1 oz glass of hard liquor (44 mL). General information Avoid eating more than 2,300 mg of salt a day. If you have hypertension, you may need to reduce your sodium intake to 1,500 mg a day. Work with your health care provider to maintain a healthy body weight or to lose weight. Ask what an ideal weight is for you. Get at least 30 minutes of exercise that causes your  heart to beat faster (aerobic exercise) most days of the week. Activities may include walking, swimming, or biking. Work with your health care provider or dietitian to adjust your eating plan to your individual calorie needs. What foods should I eat? Fruits All fresh, dried, or frozen fruit. Canned fruit in natural juice (without added sugar). Vegetables Fresh or frozen vegetables (raw, steamed, roasted, or grilled). Low-sodium or reduced-sodium tomato and vegetable juice. Low-sodium or reduced-sodium tomato sauce and  tomato paste. Low-sodium or reduced-sodium canned vegetables. Grains Whole-grain or whole-wheat bread. Whole-grain or whole-wheat pasta. Brown rice. [place]. Bulgur. Whole-grain and low-sodium cereals. Pita bread. Low-fat, low-sodium crackers. Whole-wheat flour tortillas. Meats and other proteins Skinless chicken or [place]. Ground chicken or [place]. Pork with fat trimmed off. Fish and seafood. Egg whites. Dried beans, peas, or lentils. Unsalted nuts, nut butters, and seeds. Unsalted canned beans. Lean cuts of beef with fat trimmed off. Low-sodium, lean precooked or cured meat, such as sausages or meat loaves. Dairy Low-fat (1%) or fat-free (skim) milk. Reduced-fat, low-fat, or fat-free cheeses. Nonfat, low-sodium ricotta or cottage cheese. Low-fat or nonfat yogurt. Low-fat, low-sodium cheese. Fats and oils Soft margarine without trans fats. Vegetable oil. Reduced-fat, low-fat, or light mayonnaise and salad dressings (reduced-sodium). Canola, safflower, olive, avocado, soybean, and sunflower oils. Avocado. Seasonings and condiments Herbs. Spices. Seasoning mixes without salt. Other foods Unsalted popcorn and pretzels. Fat-free sweets. The items listed above may not be a complete list of foods and beverages you can eat. Contact a dietitian for more information. What foods should I avoid? Fruits Canned fruit in a light or heavy syrup. Fried fruit. Fruit in cream or butter sauce. Vegetables Creamed or fried vegetables. Vegetables in a cheese sauce. Regular canned vegetables (not low-sodium or reduced-sodium). Regular canned tomato sauce and paste (not low-sodium or reduced-sodium). Regular tomato and vegetable juice (not low-sodium or reduced-sodium). Angie Fava. Olives. Grains Baked goods made with fat, such as croissants, muffins, or some breads. Dry pasta or rice meal packs. Meats and other proteins Fatty cuts of meat. Ribs. Fried meat. Berniece Salines. Bologna, salami, and other precooked or cured  meats, such as sausages or meat loaves. Fat from the back of a pig (fatback). Bratwurst. Salted nuts and seeds. Canned beans with added salt. Canned or smoked fish. Whole eggs or egg yolks. Chicken or [place] with skin. Dairy Whole or 2% milk, cream, and half-and-half. Whole or full-fat cream cheese. Whole-fat or sweetened yogurt. Full-fat cheese. Nondairy creamers. Whipped toppings. Processed cheese and cheese spreads. Fats and oils Butter. Stick margarine. Lard. Shortening. Ghee. Bacon fat. Tropical oils, such as coconut, palm kernel, or palm oil. Seasonings and condiments Onion salt, garlic salt, seasoned salt, table salt, and sea salt. Worcestershire sauce. Tartar sauce. Barbecue sauce. Teriyaki sauce. Soy sauce, including reduced-sodium. Steak sauce. Canned and packaged gravies. Fish sauce. Oyster sauce. Cocktail sauce. Store-bought horseradish. Ketchup. Mustard. Meat flavorings and tenderizers. Bouillon cubes. Hot sauces. Pre-made or packaged marinades. Pre-made or packaged taco seasonings. Relishes. Regular salad dressings. Other foods Salted popcorn and pretzels. The items listed above may not be a complete list of foods and beverages you should avoid. Contact a dietitian for more information. Where to find more information National Heart, Lung, and Blood Institute: https://wilson-eaton.com/ American Heart Association: www.heart.org Academy of Nutrition and Dietetics: www.eatright.[place]: www.kidney.org Summary The DASH eating plan is a healthy eating plan that has been shown to reduce high blood pressure (hypertension). It may also reduce your risk for type 2 diabetes, heart disease, and  stroke. When on the DASH eating plan, aim to eat more fresh fruits and vegetables, whole grains, lean proteins, low-fat dairy, and heart-healthy fats. With the DASH eating plan, you should limit salt (sodium) intake to 2,300 mg a day. If you have hypertension, you may need to reduce your  sodium intake to 1,500 mg a day. Work with your health care provider or dietitian to adjust your eating plan to your individual calorie needs. This information is not intended to replace advice given to you by your health care provider. Make sure you discuss any questions you have with your health care provider. Document Revised: 07/10/2019 Document Reviewed: 07/10/2019 Elsevier Patient Education  [place].

## 2022-06-20 NOTE — Progress Notes (Signed)
Established Patient Visit  Patient: Jasmine Watson   DOB: 1961/03/25   61 y.o. Female  MRN: [id number] Visit Date: 06/20/2022  Subjective:    Chief Complaint  Patient presents with   Office Visit    HTN Checks bp every other day  No concerns    HPI Primary hypertension BP not at goal Home BP reading: 150s-170s/90s-100s Has not made any lifestyle modifications BP Readings from Last 3 Encounters:  06/20/22 (!) 170/100  05/23/22 (!) 160/98  05/10/22 (!) 170/100   Increased losartan to 100mg  in AM and add amlodipine 5mg  in PM Advised about the importance of DASH diet and minimizing ETOh consumption. F/up in 73month   Wt Readings from Last 3 Encounters:  06/20/22 176 lb 3.2 oz (79.9 kg)  05/23/22 175 lb 3.2 oz (79.5 kg)  02/21/21 180 lb (81.6 kg)    Reviewed medical, surgical, and social history today  Medications: Outpatient Medications Prior to Visit  Medication Sig   famotidine (PEPCID) 20 MG tablet Take 1 tablet (20 mg total) by mouth 2 (two) times daily.   folic acid (FOLVITE) 1 MG tablet Take 1 mg by mouth daily.    methotrexate (RHEUMATREX) 2.5 MG tablet Take 2.5 mg by mouth once a week.    [DISCONTINUED] losartan (COZAAR) 50 MG tablet Take 1 tablet (50 mg total) by mouth daily.   No facility-administered medications prior to visit.   Reviewed past medical and social history.   ROS per HPI above  Last CBC Lab Results  Component Value Date   WBC 5.2 03/09/2021   HGB 14.3 03/09/2021   HCT 42.7 03/09/2021   MCV 88.0 03/09/2021   MCH 29.5 03/09/2021   RDW 13.0 03/09/2021   PLT 211 03/09/2021   Last metabolic panel Lab Results  Component Value Date   GLUCOSE 86 05/23/2022   NA 139 05/23/2022   K 3.7 05/23/2022   CL 105 05/23/2022   CO2 28 05/23/2022   BUN 15 05/23/2022   CREATININE 0.78 05/23/2022   GFRNONAA >60 03/09/2021   CALCIUM 9.2 05/23/2022   PROT 7.2 05/23/2022   ALBUMIN 4.0 05/23/2022   BILITOT 1.1 05/23/2022   ALKPHOS 57  05/23/2022   AST 36 05/23/2022   ALT 44 (H) 05/23/2022   ANIONGAP 7 03/09/2021   Last thyroid functions Lab Results  Component Value Date   TSH 0.74 05/23/2022        Objective:  BP (!) 170/100   Pulse 75   Temp (!) 97 F (36.1 C) (Temporal)   Ht 5\' 4"  (1.626 m)   Wt 176 lb 3.2 oz (79.9 kg)   SpO2 98%   BMI 30.24 kg/m      Physical Exam Cardiovascular:     Rate and Rhythm: Normal rate.     Pulses: Normal pulses.  Pulmonary:     Effort: Pulmonary effort is normal.  Musculoskeletal:     Right lower leg: No edema.     Left lower leg: No edema.  Neurological:     Mental Status: She is alert and oriented to person, place, and time.     No results found for any visits on 06/20/22.    Assessment & Plan:    Problem List Items Addressed This Visit       Cardiovascular and Mediastinum   Primary hypertension - Primary    BP not at goal Home BP reading: 150s-170s/90s-100s Has not  made any lifestyle modifications BP Readings from Last 3 Encounters:  06/20/22 (!) 170/100  05/23/22 (!) 160/98  05/10/22 (!) 170/100   Increased losartan to 100mg  in AM and add amlodipine 5mg  in PM Advised about the importance of DASH diet and minimizing ETOh consumption. F/up in 48month      Relevant Medications   losartan (COZAAR) 100 MG tablet   amLODipine (NORVASC) 5 MG tablet   Return in about 4 weeks (around 07/18/2022) for HTN.     Wilfred Lacy, NP

## 2022-07-05 ENCOUNTER — Encounter (HOSPITAL_COMMUNITY): Payer: Self-pay

## 2022-07-05 ENCOUNTER — Emergency Department (HOSPITAL_COMMUNITY): Payer: PRIVATE HEALTH INSURANCE

## 2022-07-05 ENCOUNTER — Emergency Department (HOSPITAL_COMMUNITY)
Admission: EM | Admit: 2022-07-05 | Discharge: 2022-07-05 | Disposition: A | Discharge status: Home / Self Care | Payer: PRIVATE HEALTH INSURANCE | Attending: Emergency Medicine | Admitting: Emergency Medicine

## 2022-07-05 ENCOUNTER — Other Ambulatory Visit: Payer: Self-pay

## 2022-07-05 ENCOUNTER — Telehealth: Payer: Self-pay | Admitting: Nurse Practitioner

## 2022-07-05 DIAGNOSIS — J45909 Unspecified asthma, uncomplicated: Secondary | ICD-10-CM | POA: Diagnosis not present

## 2022-07-05 DIAGNOSIS — K5792 Diverticulitis of intestine, part unspecified, without perforation or abscess without bleeding: Secondary | ICD-10-CM | POA: Diagnosis not present

## 2022-07-05 DIAGNOSIS — I10 Essential (primary) hypertension: Secondary | ICD-10-CM | POA: Insufficient documentation

## 2022-07-05 DIAGNOSIS — R519 Headache, unspecified: Secondary | ICD-10-CM | POA: Insufficient documentation

## 2022-07-05 DIAGNOSIS — R1032 Left lower quadrant pain: Secondary | ICD-10-CM | POA: Diagnosis present

## 2022-07-05 LAB — URINALYSIS, ROUTINE W REFLEX MICROSCOPIC
Bilirubin Urine: NEGATIVE
Glucose, UA: NEGATIVE mg/dL
Hgb urine dipstick: NEGATIVE
Ketones, ur: NEGATIVE mg/dL
Nitrite: NEGATIVE
Protein, ur: NEGATIVE mg/dL
Specific Gravity, Urine: 1.014 (ref 1.005–1.030)
pH: 5 (ref 5.0–8.0)

## 2022-07-05 LAB — COMPREHENSIVE METABOLIC PANEL
ALT: 53 U/L — ABNORMAL HIGH (ref 0–44)
AST: 27 U/L (ref 15–41)
Albumin: 4.4 g/dL (ref 3.5–5.0)
Alkaline Phosphatase: 57 U/L (ref 38–126)
Anion gap: 8 (ref 5–15)
BUN: 17 mg/dL (ref 8–23)
CO2: 23 mmol/L (ref 22–32)
Calcium: 9 mg/dL (ref 8.9–10.3)
Chloride: 106 mmol/L (ref 98–111)
Creatinine, Ser: 0.95 mg/dL (ref 0.44–1.00)
GFR, Estimated: >60 mL/min (ref >60)
Glucose, Bld: 92 mg/dL (ref 70–99)
Potassium: 3.5 mmol/L (ref 3.5–5.1)
Sodium: 137 mmol/L (ref 135–145)
Total Bilirubin: 1.5 mg/dL — ABNORMAL HIGH (ref 0.3–1.2)
Total Protein: 8.7 g/dL — ABNORMAL HIGH (ref 6.5–8.1)

## 2022-07-05 LAB — CBC
HCT: 43.1 % (ref 36.0–46.0)
Hemoglobin: 14.3 g/dL (ref 12.0–15.0)
MCH: 30.1 pg (ref 26.0–34.0)
MCHC: 33.2 g/dL (ref 30.0–36.0)
MCV: 90.7 fL (ref 80.0–100.0)
Platelets: 213 10*3/uL (ref 150–400)
RBC: 4.75 MIL/uL (ref 3.87–5.11)
RDW: 12.1 % (ref 11.5–15.5)
WBC: 6.8 10*3/uL (ref 4.0–10.5)
nRBC: 0 % (ref 0.0–0.2)

## 2022-07-05 LAB — LIPASE, BLOOD: Lipase: 28 U/L (ref 11–51)

## 2022-07-05 MED ORDER — ONDANSETRON HCL 4 MG PO TABS: 4.0000 mg | ORAL_TABLET | Freq: Four times a day (QID) | ORAL | 0 refills | Status: DC

## 2022-07-05 MED ORDER — IOHEXOL 300 MG/ML  SOLN
100.0000 mL | Freq: Once | INTRAMUSCULAR | Status: AC | PRN
  Administered 2022-07-05: 100 mL via INTRAVENOUS

## 2022-07-05 MED ORDER — SODIUM CHLORIDE 0.9 % IV BOLUS
1000.0000 mL | Freq: Once | INTRAVENOUS | Status: AC
  Administered 2022-07-05: 1000 mL via INTRAVENOUS

## 2022-07-05 MED ORDER — DIPHENHYDRAMINE HCL 50 MG/ML IJ SOLN
25.0000 mg | Freq: Once | INTRAMUSCULAR | Status: AC
  Administered 2022-07-05: 25 mg via INTRAVENOUS
  Filled 2022-07-05: qty 1

## 2022-07-05 MED ORDER — MORPHINE SULFATE (PF) 2 MG/ML IV SOLN
2.0000 mg | Freq: Once | INTRAVENOUS | Status: AC
  Administered 2022-07-05: 2 mg via INTRAVENOUS
  Filled 2022-07-05: qty 1

## 2022-07-05 MED ORDER — AMOXICILLIN-POT CLAVULANATE 875-125 MG PO TABS: 1.0000 | ORAL_TABLET | Freq: Two times a day (BID) | ORAL | 0 refills | Status: DC

## 2022-07-05 MED ORDER — PROCHLORPERAZINE EDISYLATE 10 MG/2ML IJ SOLN
10.0000 mg | Freq: Once | INTRAMUSCULAR | Status: AC
  Administered 2022-07-05: 10 mg via INTRAVENOUS
  Filled 2022-07-05: qty 2

## 2022-07-05 MED ORDER — OXYCODONE-ACETAMINOPHEN 5-325 MG PO TABS: 1.0000 | ORAL_TABLET | Freq: Four times a day (QID) | ORAL | 0 refills | Status: DC | PRN

## 2022-07-05 MED ORDER — SODIUM CHLORIDE (PF) 0.9 % IJ SOLN
INTRAMUSCULAR | Status: AC
  Filled 2022-07-05: qty 50

## 2022-07-05 NOTE — Telephone Encounter (Signed)
Pt called stating they were experiencing the following symptoms:  -Abdominal Pain  -Headaches localized in the back of head  -Dizziness  Pt was transferred to triage nurse for further eval.

## 2022-07-05 NOTE — ED Triage Notes (Signed)
C/o headache with dizziness and lower abd pain x2 days.  Pt reports bp med changes on 11/1 with dose change to losartan and adding amlodipine.  Denies n/v/d.  Stopped consuming etoh 2 weeks ago.

## 2022-07-05 NOTE — Discharge Instructions (Addendum)
Please use Tylenol for pain.  You may use 1000 mg of Tylenol every 6 hours.  Not to exceed 4 g of Tylenol within 24 hours.  You can use the narcotic pain medication in place of Tylenol above as needed for severe breakthrough pain.  The major treatment for this condition like I said is sticking to a full liquid diet for least a week to 2 weeks, and taking the antibiotics that I prescribed.  Drink plenty of electrolyte containing fluids including gatorade, pedialyte.

## 2022-07-05 NOTE — ED Provider Notes (Signed)
[facility]-EMERGENCY DEPT Provider Note   CSN: [id number] Arrival date & time: 07/05/22  1139     History  Chief Complaint  Patient presents with   Abdominal Pain    Jasmine Watson is a 61 y.o. female with past medical history significant for PE remotely is not currently taking anticoagulation, lupus, hypertension, childhood asthma who presents with concern for dizziness, headache, left lower quadrant abdominal pain for 2 days.  Patient reports that she has been put recently on a different blood pressure medication with dose change to losartan and adding amlodipine.  She denies vomiting, diarrhea but does endorse some nausea.  Patient reports that she stopped drinking around 2 weeks ago.  She denies any drug use.  She reports that she has had some constipation.  She denies any dysuria, hematuria, vaginal discharge, vaginal bleeding.  Denies any dark tarry stools, or blood in the stool.   Abdominal Pain      Home Medications Prior to Admission medications   Medication Sig Start Date End Date Taking? Authorizing Provider  amoxicillin-clavulanate (AUGMENTIN) 875-125 MG tablet Take 1 tablet by mouth every 12 (twelve) hours. 07/05/22  Yes Londen Bok H, PA-C  ondansetron (ZOFRAN) 4 MG tablet Take 1 tablet (4 mg total) by mouth every 6 (six) hours. 07/05/22  Yes Camielle Sizer H, PA-C  oxyCODONE-acetaminophen (PERCOCET/ROXICET) 5-325 MG tablet Take 1 tablet by mouth every 6 (six) hours as needed for severe pain. 07/05/22  Yes Issabelle Mcraney H, PA-C  amLODipine (NORVASC) 5 MG tablet Take 1 tablet (5 mg total) by mouth every evening. 06/20/22   Nche, Bonna Gainsharlotte Lum, NP  famotidine (PEPCID) 20 MG tablet Take 1 tablet (20 mg total) by mouth 2 (two) times daily. 01/04/21   Nche, Bonna Gainsharlotte Lum, NP  folic acid (FOLVITE) 1 MG tablet Take 1 mg by mouth daily.  10/15/17   [provider]  losartan (COZAAR) 100 MG tablet Take 1 tablet (100 mg total) by  mouth in the morning. 06/20/22   Nche, Bonna Gainsharlotte Lum, NP  methotrexate (RHEUMATREX) 2.5 MG tablet Take 2.5 mg by mouth once a week.  10/15/17   [provider]      Allergies    Shellfish allergy, Clarithromycin, and Metronidazole    Review of Systems   Review of Systems  Gastrointestinal:  Positive for abdominal pain.  All other systems reviewed and are negative.   Physical Exam Updated Vital Signs BP (!) 146/80   Pulse 72   Temp 98.1 F (36.7 C) (Oral)   Resp 14   SpO2 100%  Physical Exam Vitals and nursing note reviewed.  Constitutional:      General: She is not in acute distress.    Appearance: Normal appearance.  HENT:     Head: Normocephalic and atraumatic.  Eyes:     General:        Right eye: No discharge.        Left eye: No discharge.  Cardiovascular:     Rate and Rhythm: Normal rate and regular rhythm.     Heart sounds: No murmur heard.    No friction rub. No gallop.  Pulmonary:     Effort: Pulmonary effort is normal.     Breath sounds: Normal breath sounds.  Abdominal:     General: Bowel sounds are normal.     Palpations: Abdomen is soft.     Comments: Patient focally tender in the left lower quadrant with no rebound, rigidity, guarding, normal bowel sounds throughout  Skin:    General: Skin is warm and dry.     Capillary Refill: Capillary refill takes less than 2 seconds.  Neurological:     Mental Status: She is alert and oriented to person, place, and time.     Comments: Cranial nerves II through XII grossly intact.  Intact finger-nose, intact heel-to-shin.  Romberg negative, gait normal.  Alert and oriented x3.  Moves all 4 limbs spontaneously, normal coordination.  No pronator drift.  Intact strength 5 out of 5 bilateral upper and lower extremities.    Psychiatric:        Mood and Affect: Mood normal.        Behavior: Behavior normal.     ED Results / Procedures / Treatments   Labs (all labs ordered are listed, but only abnormal  results are displayed) Labs Reviewed  COMPREHENSIVE METABOLIC PANEL - Abnormal; Notable for the following components:      Result Value   Total Protein 8.7 (*)    ALT 53 (*)    Total Bilirubin 1.5 (*)    All other components within normal limits  URINALYSIS, ROUTINE W REFLEX MICROSCOPIC - Abnormal; Notable for the following components:   Color, Urine STRAW (*)    Leukocytes,Ua TRACE (*)    Bacteria, UA RARE (*)    All other components within normal limits  LIPASE, BLOOD  CBC    EKG EKG Interpretation  Date/Time:  Thursday July 05 2022 12:12:04 EST Ventricular Rate:  75 PR Interval:  150 QRS Duration: 83 QT Interval:  381 QTC Calculation: 426 R Axis:   47 Text Interpretation: Sinus rhythm Probable left atrial enlargement No significant change since prior 7/22 Confirmed by Meridee Score [id number]) on 07/05/2022 12:17:31 PM  Radiology CT ABDOMEN PELVIS W CONTRAST  Result Date: 07/05/2022 CLINICAL DATA:  Acute left lower quadrant abdominal pain. EXAM: CT ABDOMEN AND PELVIS WITH CONTRAST TECHNIQUE: Multidetector CT imaging of the abdomen and pelvis was performed using the standard protocol following bolus administration of intravenous contrast. RADIATION DOSE REDUCTION: This exam was performed according to the departmental dose-optimization program which includes automated exposure control, adjustment of the mA and/or kV according to patient size and/or use of iterative reconstruction technique. CONTRAST:  OMNIPAQUE IOHEXOL 300 MG/ML  SOLN COMPARISON:  June 21, 2009. FINDINGS: Lower chest: Minimal bibasilar subsegmental atelectasis is noted. Small sliding-type hiatal hernia is noted. Hepatobiliary: No focal liver abnormality is seen. No gallstones, gallbladder wall thickening, or biliary dilatation. Pancreas: Unremarkable. No pancreatic ductal dilatation or surrounding inflammatory changes. Spleen: Normal in size without focal abnormality. Adrenals/Urinary Tract: Adrenal  glands appear normal. Small bilateral renal cysts are noted for which no further follow-up is required. No hydronephrosis or renal obstruction is noted. Urinary bladder is unremarkable. Stomach/Bowel: The stomach appears normal. The appendix is unremarkable. Focal diverticulitis is seen involving the proximal sigmoid colon without abscess formation. There is no evidence of bowel obstruction. Vascular/Lymphatic: No significant vascular findings are present. No enlarged abdominal or pelvic lymph nodes. Reproductive: Status post hysterectomy. No adnexal masses. Other: No abdominal wall hernia or abnormality. No abdominopelvic ascites. Musculoskeletal: No acute or significant osseous findings. IMPRESSION: Focal diverticulitis is seen involving the proximal sigmoid colon. No definite abscess formation is noted. Small sliding-type hiatal hernia. Electronically Signed   By: Lupita Raider M.D.   On: 07/05/2022 13:42   CT Head Wo Contrast  Result Date: 07/05/2022 CLINICAL DATA:  Headache and dizziness. EXAM: CT HEAD WITHOUT CONTRAST TECHNIQUE: Contiguous axial images were obtained  from the base of the skull through the vertex without intravenous contrast. RADIATION DOSE REDUCTION: This exam was performed according to the departmental dose-optimization program which includes automated exposure control, adjustment of the mA and/or kV according to patient size and/or use of iterative reconstruction technique. COMPARISON:  None Available. FINDINGS: Brain: No evidence of acute infarction, hemorrhage, hydrocephalus, extra-axial collection or mass lesion/mass effect. Vascular: Negative for hyperdense vessel Skull: Negative Sinuses/Orbits: Paranasal sinuses clear.  Negative orbit Other: None IMPRESSION: Negative CT head. Electronically Signed   By: Marlan Palau M.D.   On: 07/05/2022 13:34    Procedures Procedures    Medications Ordered in ED Medications  sodium chloride (PF) 0.9 % injection (has no administration in  time range)  prochlorperazine (COMPAZINE) injection 10 mg (10 mg Intravenous Given 07/05/22 1222)  diphenhydrAMINE (BENADRYL) injection 25 mg (25 mg Intravenous Given 07/05/22 1224)  sodium chloride 0.9 % bolus 1,000 mL (0 mLs Intravenous Stopped 07/05/22 1329)  morphine (PF) 2 MG/ML injection 2 mg (2 mg Intravenous Given 07/05/22 1225)  iohexol (OMNIPAQUE) 300 MG/ML solution 100 mL (100 mLs Intravenous Contrast Given 07/05/22 1306)    ED Course/ Medical Decision Making/ A&P                           Medical Decision Making  This patient is a 61 y.o. female who presents to the ED for concern of llq abdominal pain, nausea, headache, this involves an extensive number of treatment options, and is a complaint that carries with it a high risk of complications and morbidity. The emergent differential diagnosis prior to evaluation includes, but is not limited to, diverticulitis, appendicitis, colitis, other intra-abdominal abnormality, UTI, pyelonephritis, tubo-ovarian abscess, nephrolithiasis, versus other, patient additionally with some headache, likely secondary to dehydration, but considered intra-abdominal abnormality, subarachnoid hemorrhage, versus other.   This is not an exhaustive differential.   Past Medical History / Co-morbidities / Social History: Remote history of PE, asthma, hypertension, arthritis  Additional history: Chart reviewed. Pertinent results include: Reviewed outpatient family medicine visits lab work, imaging from previous emergency department visits  Physical Exam: Physical exam performed. The pertinent findings include: Patient focally tender in the left lower quadrant without rebound, rigidity, guarding, she does endorse a headache on the posterior right aspect of the head, but has no focal neurologic deficits on my exam  Lab Tests: I ordered, and personally interpreted labs.  The pertinent results include: BC unremarkable, lipase normal, CMP notable for mild  elevated total bilirubin 1.5, think likely secondary to dehydration over any acute gallbladder pathology, UA without any evidence of acute UTI   Imaging Studies: I ordered imaging studies including CT abdomen pelvis with contrast, CT head without contrast. I independently visualized and interpreted imaging which showed evidence of diverticulitis without abscess or perforation, CT head without any abnormality. I agree with the radiologist interpretation.   Cardiac Monitoring:  The patient was maintained on a cardiac monitor.  My attending physician Dr. Charm Barges viewed and interpreted the cardiac monitored which showed an underlying rhythm of: NSR. I agree with this interpretation.   Medications: I ordered medication including fluid bolus, morphine, Benadryl, Compazine for abdominal pain, headache. Reevaluation of the patient after these medicines showed that the patient stayed the same. I have reviewed the patients home medicines and have made adjustments as needed.  Disposition: After consideration of the diagnostic results and the patients response to treatment, I feel that patient symptoms are overall consistent with an  acute diverticulitis, pancreatic likely required to nausea, dehydration, and her overall pain, but with no red flag symptoms or focal neurologic deficits, we will treat with liquid diet, Augmentin, and close PCP follow-up.   emergency department workup does not suggest an emergent condition requiring admission or immediate intervention beyond what has been performed at this time. The plan is: as above. The patient is safe for discharge and has been instructed to return immediately for worsening symptoms, change in symptoms or any other concerns.  I discussed this case with my attending physician Dr. Charm Barges who cosigned this note including patient's presenting symptoms, physical exam, and planned diagnostics and interventions. Attending physician stated agreement with plan or made  changes to plan which were implemented.    Final Clinical Impression(s) / ED Diagnoses Final diagnoses:  Diverticulitis  Acute nonintractable headache, unspecified headache type    Rx / DC Orders ED Discharge Orders          Ordered    amoxicillin-clavulanate (AUGMENTIN) 875-125 MG tablet  Every 12 hours        07/05/22 1406    ondansetron (ZOFRAN) 4 MG tablet  Every 6 hours        07/05/22 1406    oxyCODONE-acetaminophen (PERCOCET/ROXICET) 5-325 MG tablet  Every 6 hours PRN        07/05/22 1406              Azeem Poorman, Nittany H, PA-C 07/05/22 1410    Terrilee Files, MD 07/05/22 2047

## 2022-07-18 ENCOUNTER — Ambulatory Visit (INDEPENDENT_AMBULATORY_CARE_PROVIDER_SITE_OTHER): Payer: PRIVATE HEALTH INSURANCE | Admitting: Nurse Practitioner

## 2022-07-18 ENCOUNTER — Encounter: Payer: Self-pay | Admitting: Nurse Practitioner

## 2022-07-18 VITALS — BP 138/88 | HR 75 | Temp 96.4°F | Ht 64.0 in | Wt 174.4 lb

## 2022-07-18 DIAGNOSIS — K2100 Gastro-esophageal reflux disease with esophagitis, without bleeding: Secondary | ICD-10-CM

## 2022-07-18 DIAGNOSIS — I10 Essential (primary) hypertension: Secondary | ICD-10-CM

## 2022-07-18 DIAGNOSIS — K5792 Diverticulitis of intestine, part unspecified, without perforation or abscess without bleeding: Secondary | ICD-10-CM

## 2022-07-18 DIAGNOSIS — K644 Residual hemorrhoidal skin tags: Secondary | ICD-10-CM

## 2022-07-18 MED ORDER — MOXIFLOXACIN HCL 400 MG PO TABS: 400.0000 mg | ORAL_TABLET | Freq: Every day | ORAL | 0 refills | Status: DC

## 2022-07-18 MED ORDER — HYDROCORTISONE ACETATE 25 MG RE SUPP: 25.0000 mg | Freq: Two times a day (BID) | RECTAL | 0 refills | Status: DC

## 2022-07-18 MED ORDER — SUCRALFATE 1 G PO TABS: 1.0000 g | ORAL_TABLET | Freq: Three times a day (TID) | ORAL | 0 refills | Status: DC

## 2022-07-18 NOTE — Progress Notes (Signed)
Established Patient Visit  Patient: Jasmine Watson   DOB: Dec 27, 1960   61 y.o. Female  MRN: [id number] Visit Date: 07/18/2022  Subjective:    Chief Complaint  Patient presents with   Office Visit    HTN, ED f/u 07/05/2022 Tried to check BP daily  Constipation    HPI Gastroesophageal reflux disease with esophagitis without hemorrhage Unable to use any PPI due to DDI with methotrexate. Minimal improvement with famotidine and diet modification. States she has discontinued all ETOH consumption x 16month.  Stop famotidine Start carafate TID AC Ref to GI if no improvement  Acute diverticulitis Diagnosed by ED provider 07/05/2022, treated with augmentinx 5days. She reports persistent LLQ and L. Groin pain and constipation. Denies any fever or nausea or emesis. She is able to eat and drink without any discomfort.  Start moxiflaxacin x 7days, probiotics OTC, and colace 100mg  BID. Advised about need for high fiber diet and adequate oral hydration. Advised to call office if no improvement in 1week  Primary hypertension BP at goal with losartan and amlodipine. BP Readings from Last 3 Encounters:  07/18/22 138/88  07/05/22 (!) 142/83  06/20/22 (!) 170/100    Maintain med doses  Reviewed medical, surgical, and social history today  Medications: Outpatient Medications Prior to Visit  Medication Sig   amLODipine (NORVASC) 5 MG tablet Take 1 tablet (5 mg total) by mouth every evening.   folic acid (FOLVITE) 1 MG tablet Take 1 mg by mouth daily.    methotrexate (RHEUMATREX) 2.5 MG tablet Take 2.5 mg by mouth once a week.    oxyCODONE-acetaminophen (PERCOCET/ROXICET) 5-325 MG tablet Take 1 tablet by mouth every 6 (six) hours as needed for severe pain.   losartan (COZAAR) 100 MG tablet Take 1 tablet (100 mg total) by mouth in the morning. (Patient not taking: Reported on 07/18/2022)   [DISCONTINUED] amoxicillin-clavulanate (AUGMENTIN) 875-125 MG tablet Take 1 tablet by  mouth every 12 (twelve) hours. (Patient not taking: Reported on 07/18/2022)   [DISCONTINUED] famotidine (PEPCID) 20 MG tablet Take 1 tablet (20 mg total) by mouth 2 (two) times daily. (Patient not taking: Reported on 07/18/2022)   [DISCONTINUED] ondansetron (ZOFRAN) 4 MG tablet Take 1 tablet (4 mg total) by mouth every 6 (six) hours. (Patient not taking: Reported on 07/18/2022)   No facility-administered medications prior to visit.   Reviewed past medical and social history.   ROS per HPI above  Last CBC Lab Results  Component Value Date   WBC 6.8 07/05/2022   HGB 14.3 07/05/2022   HCT 43.1 07/05/2022   MCV 90.7 07/05/2022   MCH 30.1 07/05/2022   RDW 12.1 07/05/2022   PLT 213 07/05/2022   Last metabolic panel Lab Results  Component Value Date   GLUCOSE 92 07/05/2022   NA 137 07/05/2022   K 3.5 07/05/2022   CL 106 07/05/2022   CO2 23 07/05/2022   BUN 17 07/05/2022   CREATININE 0.95 07/05/2022   GFRNONAA >60 07/05/2022   CALCIUM 9.0 07/05/2022   PROT 8.7 (H) 07/05/2022   ALBUMIN 4.4 07/05/2022   BILITOT 1.5 (H) 07/05/2022   ALKPHOS 57 07/05/2022   AST 27 07/05/2022   ALT 53 (H) 07/05/2022   ANIONGAP 8 07/05/2022        Objective:  BP 138/88 (BP Location: Right Arm, Patient Position: Sitting, Cuff Size: Small)   Pulse 75   Temp (!) 96.4 F (35.8 C) (Temporal)  Ht 5\' 4"  (1.626 m)   Wt 174 lb 6.4 oz (79.1 kg)   SpO2 98%   BMI 29.94 kg/m      Physical Exam Vitals reviewed.  Constitutional:      General: She is not in acute distress. Cardiovascular:     Rate and Rhythm: Normal rate.     Pulses: Normal pulses.  Pulmonary:     Effort: Pulmonary effort is normal.  Abdominal:     General: There is no distension.     Palpations: Abdomen is soft. There is no mass.     Tenderness: There is abdominal tenderness. There is guarding. There is no right CVA tenderness or left CVA tenderness.  Neurological:     Mental Status: She is alert and oriented to person,  place, and time.     No results found for any visits on 07/18/22.    Assessment & Plan:    Problem List Items Addressed This Visit       Cardiovascular and Mediastinum   Primary hypertension - Primary    BP at goal with losartan and amlodipine. BP Readings from Last 3 Encounters:  07/18/22 138/88  07/05/22 (!) 142/83  06/20/22 (!) 170/100    Maintain med doses        Digestive   Acute diverticulitis    Diagnosed by ED provider 07/05/2022, treated with augmentinx 5days. She reports persistent LLQ and L. Groin pain and constipation. Denies any fever or nausea or emesis. She is able to eat and drink without any discomfort.  Start moxiflaxacin x 7days, probiotics OTC, and colace 100mg  BID. Advised about need for high fiber diet and adequate oral hydration. Advised to call office if no improvement in 1week      Relevant Medications   moxifloxacin (AVELOX) 400 MG tablet   Gastroesophageal reflux disease with esophagitis without hemorrhage    Unable to use any PPI due to DDI with methotrexate. Minimal improvement with famotidine and diet modification. States she has discontinued all ETOH consumption x 4month.  Stop famotidine Start carafate TID AC Ref to GI if no improvement      Relevant Medications   sucralfate (CARAFATE) 1 g tablet   Other Visit Diagnoses     External hemorrhoid       Relevant Medications   hydrocortisone (ANUSOL-HC) 25 MG suppository      Return in about 3 months (around 10/18/2022) for HTN, hyperlipidemia (fasting).     2month, NP

## 2022-07-18 NOTE — Assessment & Plan Note (Addendum)
Diagnosed by ED provider 07/05/2022, treated with augmentinx 5days. She reports persistent LLQ and L. Groin pain and constipation. Denies any fever or nausea or emesis. She is able to eat and drink without any discomfort.  Start moxiflaxacin x 7days, probiotics OTC, and colace 100mg  BID. Advised about need for high fiber diet and adequate oral hydration. Advised to call office if no improvement in 1week

## 2022-07-18 NOTE — Assessment & Plan Note (Signed)
BP at goal with losartan and amlodipine. BP Readings from Last 3 Encounters:  07/18/22 138/88  07/05/22 (!) 142/83  06/20/22 (!) 170/100    Maintain med doses

## 2022-07-18 NOTE — Assessment & Plan Note (Signed)
Unable to use any PPI due to DDI with methotrexate. Minimal improvement with famotidine and diet modification. States she has discontinued all ETOH consumption x 74month.  Stop famotidine Start carafate TID AC Ref to GI if no improvement

## 2022-07-18 NOTE — Patient Instructions (Addendum)
Maintain BP medication doses Start probiotics-Align or curturelle or florastor 1cap daily while on oral antibiotics. Start moxifloxacin x 7days. Start colace 100mg  BID-stool softener for constipation, stop if develops diarrhea. Maintain bland diet x 3days, then advance a tolerated Maintain adequate oral hydration. Call office if no improvement in 1week.  Diverticulitis  Diverticulitis is when small pouches in your colon (large intestine) get infected or swollen. This causes pain in the belly (abdomen) and watery poop (diarrhea). These pouches are called diverticula. The pouches form in people who have a condition called diverticulosis. What are the causes? This condition may be caused by poop (stool) that gets trapped in the pouches in your colon. The poop lets germs (bacteria) grow in the pouches. This causes the infection. What increases the risk? You are more likely to get this condition if you have small pouches in your colon. The risk is higher if: You are overweight or very overweight (obese). You do not exercise enough. You drink alcohol. You smoke or use products with tobacco in them. You eat a diet that has a lot of red meat such as beef, pork, or lamb. You eat a diet that does not have enough fiber in it. You are older than 61 years of age. What are the signs or symptoms? Pain in the belly. Pain is often on the left side, but it may be in other areas. Fever and feeling cold. Feeling like you may vomit. Vomiting. Having cramps. Feeling full. Changes to how often you poop. Blood in your poop. How is this treated? Most cases are treated at home by: Taking over-the-counter pain medicines. Following a clear liquid diet. Taking antibiotic medicines. Resting. Very bad cases may need to be treated at a hospital. This may include: Not eating or drinking. Taking prescription pain medicine. Getting antibiotic medicines through an IV tube. Getting fluid and food through an IV  tube. Having surgery. When you are feeling better, your doctor may tell you to have a test to check your colon (colonoscopy). Follow these instructions at home: Medicines Take over-the-counter and prescription medicines only as told by your doctor. These include: Antibiotics. Pain medicines. Fiber pills. Probiotics. Stool softeners. If you were prescribed an antibiotic medicine, take it as told by your doctor. Do not stop taking the antibiotic even if you start to feel better. Ask your doctor if the medicine prescribed to you requires you to avoid driving or using machinery. Eating and drinking  Follow a diet as told by your doctor. When you feel better, your doctor may tell you to change your diet. You may need to eat a lot of fiber. Fiber makes it easier to poop (have a bowel movement). Foods with fiber include: Berries. Beans. Lentils. Green vegetables. Avoid eating red meat. General instructions Do not use any products that contain nicotine or tobacco, such as cigarettes, e-cigarettes, and chewing tobacco. If you need help quitting, ask your doctor. Exercise 3 or more times a week. Try to get 30 minutes each time. Exercise enough to sweat and make your heart beat faster. Keep all follow-up visits as told by your doctor. This is important. Contact a doctor if: Your pain does not get better. You are not pooping like normal. Get help right away if: Your pain gets worse. Your symptoms do not get better. Your symptoms get worse very fast. You have a fever. You vomit more than one time. You have poop that is: Bloody. Black. Tarry. Summary This condition happens when small pouches in  your colon get infected or swollen. Take medicines only as told by your doctor. Follow a diet as told by your doctor. Keep all follow-up visits as told by your doctor. This is important. This information is not intended to replace advice given to you by your health care provider. Make sure you  discuss any questions you have with your health care provider. Document Revised: 05/18/2019 Document Reviewed: 05/18/2019 Elsevier Patient Education  [employer].

## 2022-10-04 ENCOUNTER — Ambulatory Visit (INDEPENDENT_AMBULATORY_CARE_PROVIDER_SITE_OTHER): Payer: PRIVATE HEALTH INSURANCE | Admitting: Nurse Practitioner

## 2022-10-04 ENCOUNTER — Ambulatory Visit (INDEPENDENT_AMBULATORY_CARE_PROVIDER_SITE_OTHER)
Admission: RE | Admit: 2022-10-04 | Discharge: 2022-10-04 | Disposition: A | Discharge status: Home / Self Care | Payer: PRIVATE HEALTH INSURANCE | Source: Ambulatory Visit | Attending: Nurse Practitioner | Admitting: Nurse Practitioner

## 2022-10-04 ENCOUNTER — Encounter: Payer: Self-pay | Admitting: Nurse Practitioner

## 2022-10-04 VITALS — BP 122/68 | HR 66 | Temp 98.3°F | Resp 16 | Ht 64.0 in | Wt 174.0 lb

## 2022-10-04 DIAGNOSIS — I10 Essential (primary) hypertension: Secondary | ICD-10-CM | POA: Diagnosis not present

## 2022-10-04 DIAGNOSIS — R7989 Other specified abnormal findings of blood chemistry: Secondary | ICD-10-CM | POA: Diagnosis not present

## 2022-10-04 DIAGNOSIS — Z13220 Encounter for screening for lipoid disorders: Secondary | ICD-10-CM

## 2022-10-04 DIAGNOSIS — Z136 Encounter for screening for cardiovascular disorders: Secondary | ICD-10-CM

## 2022-10-04 DIAGNOSIS — J209 Acute bronchitis, unspecified: Secondary | ICD-10-CM | POA: Diagnosis not present

## 2022-10-04 DIAGNOSIS — K2100 Gastro-esophageal reflux disease with esophagitis, without bleeding: Secondary | ICD-10-CM | POA: Diagnosis not present

## 2022-10-04 DIAGNOSIS — R051 Acute cough: Secondary | ICD-10-CM | POA: Diagnosis not present

## 2022-10-04 LAB — HEPATIC FUNCTION PANEL
ALT: 26 U/L (ref 0–35)
AST: 26 U/L (ref 0–37)
Albumin: 3.8 g/dL (ref 3.5–5.2)
Alkaline Phosphatase: 57 U/L (ref 39–117)
Bilirubin, Direct: 0.2 mg/dL (ref 0.0–0.3)
Total Bilirubin: 1.3 mg/dL — ABNORMAL HIGH (ref 0.2–1.2)
Total Protein: 7.3 g/dL (ref 6.0–8.3)

## 2022-10-04 LAB — LIPID PANEL
Cholesterol: 230 mg/dL — ABNORMAL HIGH (ref 0–200)
HDL: 63.8 mg/dL (ref >39.00)
LDL Cholesterol: 149 mg/dL — ABNORMAL HIGH (ref 0–99)
NonHDL: 166.12
Total CHOL/HDL Ratio: 4
Triglycerides: 85 mg/dL (ref 0.0–149.0)
VLDL: 17 mg/dL (ref 0.0–40.0)

## 2022-10-04 MED ORDER — METHYLPREDNISOLONE 4 MG PO TBPK: ORAL_TABLET | ORAL | 0 refills | Status: DC

## 2022-10-04 MED ORDER — PROMETHAZINE-DM 6.25-15 MG/5ML PO SYRP: 5.0000 mL | ORAL_SOLUTION | Freq: Three times a day (TID) | ORAL | 0 refills | Status: DC | PRN

## 2022-10-04 MED ORDER — SUCRALFATE 1 G PO TABS: 1.0000 g | ORAL_TABLET | Freq: Three times a day (TID) | ORAL | 0 refills | Status: DC

## 2022-10-04 MED ORDER — ALBUTEROL SULFATE HFA 108 (90 BASE) MCG/ACT IN AERS: 1.0000 | INHALATION_SPRAY | Freq: Four times a day (QID) | RESPIRATORY_TRACT | 0 refills | Status: DC | PRN

## 2022-10-04 NOTE — Progress Notes (Signed)
Established Patient Visit  Patient: Jasmine Watson   DOB: Nov 21, 1960   62 y.o. Female  MRN: [id number] Visit Date: 10/04/2022  Subjective:    Chief Complaint  Patient presents with   Cough    Productive cough for one week.    Cough This is a new problem. The current episode started in the past 7 days. The problem has been unchanged. The problem occurs constantly. The cough is Productive of purulent sputum. Associated symptoms include chest pain, chills, ear pain, headaches, myalgias, nasal congestion, postnasal drip, rhinorrhea, a sore throat and shortness of breath. Pertinent negatives include no ear congestion, fever, heartburn, hemoptysis, rash, sweats, weight loss or wheezing. The symptoms are aggravated by lying down and cold air. She has tried OTC cough suppressant for the symptoms. The treatment provided mild relief. Her past medical history is significant for asthma.  Use of avelox for acute diverticulitis 54monthago. Reports 2 negative home COVID tests.  Primary hypertension BP at goal with amlodipine and losartan BP Readings from Last 3 Encounters:  10/04/22 122/68  07/18/22 138/88  07/05/22 (!) 142/83    Maintain med dose Repeat bmp due to previous hypokalemia  Gastroesophageal reflux disease with esophagitis without hemorrhage Some improvement with use of carafate. Symptoms got worse with discontinuation of med Has continue to avoid ETOH use.  Maintain carafate dose Entered referral to GI   BP Readings from Last 3 Encounters:  10/04/22 122/68  07/18/22 138/88  07/05/22 (!) 142/83    Reviewed medical, surgical, and social history today  Medications: Outpatient Medications Prior to Visit  Medication Sig   amLODipine (NORVASC) 5 MG tablet Take 1 tablet (5 mg total) by mouth every evening.   folic acid (FOLVITE) 1 MG tablet Take 1 mg by mouth daily.    losartan (COZAAR) 100 MG tablet Take 1 tablet (100 mg total) by mouth in the morning.    methotrexate (RHEUMATREX) 2.5 MG tablet Take 2.5 mg by mouth once a week.    triamcinolone ointment (KENALOG) 0.1 % Apply topically 2 (two) times daily.   [DISCONTINUED] hydrocortisone (ANUSOL-HC) 25 MG suppository Place 1 suppository (25 mg total) rectally 2 (two) times daily. (Patient not taking: Reported on 10/04/2022)   [DISCONTINUED] moxifloxacin (AVELOX) 400 MG tablet Take 1 tablet (400 mg total) by mouth daily. With food (Patient not taking: Reported on 10/04/2022)   [DISCONTINUED] sucralfate (CARAFATE) 1 g tablet Take 1 tablet (1 g total) by mouth 3 (three) times daily before meals for 7 days. (Patient not taking: Reported on 10/04/2022)   No facility-administered medications prior to visit.   Reviewed past medical and social history.   ROS per HPI above      Objective:  BP 122/68 (BP Location: Left Arm, Patient Position: Sitting, Cuff Size: Normal)   Pulse 66   Temp 98.3 F (36.8 C) (Oral)   Resp 16   Ht 5' 4"$  (1.626 m)   Wt 174 lb (78.9 kg)   SpO2 97%   BMI 29.87 kg/m      Physical Exam Vitals reviewed.  Constitutional:      General: She is not in acute distress.    Appearance: She is not ill-appearing.  Cardiovascular:     Rate and Rhythm: Normal rate and regular rhythm.     Pulses: Normal pulses.     Heart sounds: Normal heart sounds.  Pulmonary:     Effort: Pulmonary effort is  normal. No respiratory distress.     Breath sounds: Wheezing present. No rales.     Comments: Deceased lung sounds in bilateral lung bases Musculoskeletal:     Right lower leg: No edema.     Left lower leg: No edema.  Neurological:     Mental Status: She is alert and oriented to person, place, and time.     No results found for any visits on 10/04/22.    Assessment & Plan:    Problem List Items Addressed This Visit       Cardiovascular and Mediastinum   Primary hypertension    BP at goal with amlodipine and losartan BP Readings from Last 3 Encounters:  10/04/22 122/68   07/18/22 138/88  07/05/22 (!) 142/83    Maintain med dose Repeat bmp due to previous hypokalemia        Digestive   Gastroesophageal reflux disease with esophagitis without hemorrhage    Some improvement with use of carafate. Symptoms got worse with discontinuation of med Has continue to avoid ETOH use.  Maintain carafate dose Entered referral to GI      Relevant Medications   sucralfate (CARAFATE) 1 g tablet   Other Relevant Orders   Ambulatory referral to Gastroenterology   Basic metabolic panel   Other Visit Diagnoses     Acute bronchitis, unspecified organism    -  Primary   Relevant Medications   promethazine-dextromethorphan (PROMETHAZINE-DM) 6.25-15 MG/5ML syrup   albuterol (VENTOLIN HFA) 108 (90 Base) MCG/ACT inhaler   methylPREDNISolone (MEDROL DOSEPAK) 4 MG TBPK tablet   Other Relevant Orders   DG Chest 2 View   Elevated LFTs       Relevant Orders   Hepatic function panel   Encounter for lipid screening for cardiovascular disease       Relevant Orders   Lipid panel      Return in about 3 months (around 01/02/2023) for HTN.     Wilfred Lacy, NP

## 2022-10-04 NOTE — Patient Instructions (Signed)
Go to [address] for CXR Go to lab

## 2022-10-04 NOTE — Assessment & Plan Note (Signed)
BP at goal with amlodipine and losartan BP Readings from Last 3 Encounters:  10/04/22 122/68  07/18/22 138/88  07/05/22 (!) 142/83    Maintain med dose Repeat bmp due to previous hypokalemia

## 2022-10-04 NOTE — Assessment & Plan Note (Signed)
Some improvement with use of carafate. Symptoms got worse with discontinuation of med Has continue to avoid ETOH use.  Maintain carafate dose Entered referral to GI

## 2022-10-10 ENCOUNTER — Encounter: Payer: Self-pay | Admitting: Gastroenterology

## 2022-10-17 ENCOUNTER — Telehealth: Payer: Self-pay | Admitting: Nurse Practitioner

## 2022-10-17 DIAGNOSIS — J40 Bronchitis, not specified as acute or chronic: Secondary | ICD-10-CM

## 2022-10-17 DIAGNOSIS — H9209 Otalgia, unspecified ear: Secondary | ICD-10-CM

## 2022-10-17 MED ORDER — AMOXICILLIN-POT CLAVULANATE 875-125 MG PO TABS: 1.0000 | ORAL_TABLET | Freq: Two times a day (BID) | ORAL | 0 refills | Status: DC

## 2022-10-17 NOTE — Telephone Encounter (Signed)
Pt was seen by Jasmine Watson on 10/04/22 for coughing and feeling bad. She says her L ear is bothering her and she would like a referral placed to an ENT specialist. Her head also hurts and she's "swallowing stuff". Please a advise pt @ [phone].

## 2022-10-18 ENCOUNTER — Ambulatory Visit: Payer: PRIVATE HEALTH INSURANCE | Admitting: Nurse Practitioner

## 2022-10-18 NOTE — Telephone Encounter (Signed)
Pt called to check on the referral to ENT she stated that her ear is very stopped up.

## 2022-10-18 NOTE — Telephone Encounter (Signed)
Scheduled for 10/22/22

## 2022-10-22 ENCOUNTER — Encounter: Payer: Self-pay | Admitting: Nurse Practitioner

## 2022-10-22 ENCOUNTER — Ambulatory Visit: Payer: PRIVATE HEALTH INSURANCE | Admitting: Nurse Practitioner

## 2022-10-22 ENCOUNTER — Telehealth: Payer: Self-pay | Admitting: Nurse Practitioner

## 2022-10-22 VITALS — BP 128/80 | HR 83 | Temp 98.4°F | Resp 16 | Ht 64.0 in | Wt 198.0 lb

## 2022-10-22 DIAGNOSIS — H9192 Unspecified hearing loss, left ear: Secondary | ICD-10-CM | POA: Diagnosis not present

## 2022-10-22 DIAGNOSIS — D84821 Immunodeficiency due to drugs: Secondary | ICD-10-CM | POA: Diagnosis not present

## 2022-10-22 DIAGNOSIS — Z79899 Other long term (current) drug therapy: Secondary | ICD-10-CM | POA: Diagnosis not present

## 2022-10-22 DIAGNOSIS — K1120 Sialoadenitis, unspecified: Secondary | ICD-10-CM | POA: Diagnosis not present

## 2022-10-22 MED ORDER — CLINDAMYCIN HCL 300 MG PO CAPS: 300.0000 mg | ORAL_CAPSULE | Freq: Three times a day (TID) | ORAL | 0 refills | Status: DC

## 2022-10-22 MED ORDER — LINEZOLID 600 MG PO TABS: 600.0000 mg | ORAL_TABLET | Freq: Two times a day (BID) | ORAL | 0 refills | Status: DC

## 2022-10-22 MED ORDER — CIPROFLOXACIN HCL 750 MG PO TABS: 750.0000 mg | ORAL_TABLET | Freq: Two times a day (BID) | ORAL | 0 refills | Status: DC

## 2022-10-22 NOTE — Progress Notes (Addendum)
Established Patient Visit  Patient: Jasmine Watson   DOB: 09/02/1960   63 y.o. Female  MRN: [id number] Visit Date: 10/22/2022  Subjective:    Chief Complaint  Patient presents with   Ear Pain    Ear Popping, feels full  and left neck lymph pain    Ear Fullness  There is pain in the left ear. This is a new problem. The current episode started 1 to 4 weeks ago. The problem occurs constantly. The problem has been unchanged. There has been no fever. The pain is moderate. Associated symptoms include hearing loss. Pertinent negatives include no abdominal pain, coughing, diarrhea, ear discharge, headaches, neck pain, rash, rhinorrhea, sore throat or vomiting. She has tried nothing for the symptoms. There is no history of a chronic ear infection, hearing loss or a tympanostomy tube.  Allergic reaction to metronidazole and clarithromycin: rash Use able to use bactrim due to DDI with methotrexate. Completed oral abx course for acute diverticulitis within the last 23month (augmentin and avelox).  Reviewed medical, surgical, and social history today  Medications: Outpatient Medications Prior to Visit  Medication Sig   albuterol (VENTOLIN HFA) 108 (90 Base) MCG/ACT inhaler Inhale 1-2 puffs into the lungs every 6 (six) hours as needed for wheezing or shortness of breath.   amLODipine (NORVASC) 5 MG tablet Take 1 tablet (5 mg total) by mouth every evening.   folic acid (FOLVITE) 1 MG tablet Take 1 mg by mouth daily.    losartan (COZAAR) 100 MG tablet Take 1 tablet (100 mg total) by mouth in the morning.   methotrexate (RHEUMATREX) 2.5 MG tablet Take 2.5 mg by mouth once a week.    sucralfate (CARAFATE) 1 g tablet Take 1 tablet (1 g total) by mouth 3 (three) times daily before meals.   triamcinolone ointment (KENALOG) 0.1 % Apply topically 2 (two) times daily.   [DISCONTINUED] promethazine-dextromethorphan (PROMETHAZINE-DM) 6.25-15 MG/5ML syrup Take 5 mLs by mouth 3 (three) times daily as  needed for cough.   [DISCONTINUED] amoxicillin-clavulanate (AUGMENTIN) 875-125 MG tablet Take 1 tablet by mouth 2 (two) times daily. (Patient not taking: Reported on 10/22/2022)   [DISCONTINUED] methylPREDNISolone (MEDROL DOSEPAK) 4 MG TBPK tablet Take as directed on package (Patient not taking: Reported on 10/22/2022)   No facility-administered medications prior to visit.   Reviewed past medical and social history.   ROS per HPI above      Objective:  BP 128/80 (BP Location: Left Arm, Patient Position: Sitting, Cuff Size: Normal)   Pulse 83   Temp 98.4 F (36.9 C) (Other (Comment))   Resp 16   Ht '5\' 4"'$  (1.626 m)   Wt 198 lb (89.8 kg)   SpO2 98%   BMI 33.99 kg/m      Physical Exam Vitals reviewed.  Constitutional:      General: She is not in acute distress.    Appearance: She is not ill-appearing.  HENT:     Head: Normocephalic.     Jaw: Tenderness, swelling and pain on movement present. No trismus or malocclusion.     Salivary Glands: Right salivary gland is not diffusely enlarged or tender. Left salivary gland is diffusely enlarged and tender.      Right Ear: Hearing, tympanic membrane, ear canal and external ear normal. No middle ear effusion. No mastoid tenderness.     Left Ear: Tympanic membrane, ear canal and external ear normal. Decreased hearing noted. Tenderness  present.  No middle ear effusion. There is no impacted cerumen. No mastoid tenderness.     Nose:     Right Sinus: No maxillary sinus tenderness or frontal sinus tenderness.     Left Sinus: Maxillary sinus tenderness present. No frontal sinus tenderness.     Mouth/Throat:     Tongue: No lesions. Tongue does not deviate from midline.     Pharynx: Oropharynx is clear. Uvula midline.     Tonsils: No tonsillar exudate or tonsillar abscesses.  Neck:     Thyroid: No thyroid mass, thyromegaly or thyroid tenderness.  Cardiovascular:     Rate and Rhythm: Normal rate.     Pulses: Normal pulses.  Pulmonary:      Effort: Pulmonary effort is normal.  Musculoskeletal:     Cervical back: Normal range of motion and neck supple.  Lymphadenopathy:     Cervical: No cervical adenopathy.  Neurological:     Mental Status: She is alert and oriented to person, place, and time.     No results found for any visits on 10/22/22.    Assessment & Plan:   Collaborated with Dr. Redmond Baseman ([facility]) via telephone. He agreed with prescribed oral abx course. Jasmine Watson is scheduled to be seen on 10/25/2022 '@2'$ : 50pm. 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 Risk of MRSA due to recent oral abx use and  Start clindamycin and cipro Also start oral probiotic to prevent diarrhea Apply warm compress 2-3x/day Use ibuprofen as needed for pain Advised to call office if no improvement in 1week  Return if symptoms worsen or fail to improve.     Jasmine Lacy, NP

## 2022-10-22 NOTE — Addendum Note (Signed)
Addended by: Wilfred Lacy L on: 10/22/2022 04:08 PM   Modules accepted: Level of Service

## 2022-10-22 NOTE — Patient Instructions (Addendum)
Start clindamycin and cipro Also start oral probiotic to prevent diarrhea Apply warm compress 2-3x/day Use ibuprofen as needed for pain Advised to call office if no improvement in 1week scheduled with [facility] on 10/25/2022 '@2'$ : 50pm, [address] [place], [place] [address]  Salivary Gland Infection  A salivary gland infection is an infection in one or more of the glands that make saliva. There are six major salivary glands. Each gland has a tube (duct) that carries saliva into the mouth. Saliva keeps the mouth moist and breaks down the food that a person eats. It also helps prevent tooth decay. You have two salivary glands just in front of your ears (parotid). The ducts for these glands open up inside your cheeks, near your back teeth. You also have two glands under your tongue (sublingual) and two glands under your jaw (submandibular). The ducts for these glands open under your tongue. Any salivary gland can get infected. Most infections occur in the parotid glands or submandibular glands. What are the causes? This condition may be caused by bacteria or viruses. The bacteria that cause salivary gland infections are usually the same bacteria that normally live in your mouth. A stone can form in a salivary gland and block the flow of saliva. Bacteria may then start to grow behind the blockage and cause infection. Bacterial infections usually cause pain and swelling on one side of the face. Submandibular gland swelling occurs under the jaw. Parotid swelling occurs in front of the ear. Bacterial infections are more common in adults. The most common cause of viral salivary gland infections is the mumps virus. However, vaccination has made mumps rare. This infection causes swelling in both parotid glands. Viral infections are more common in children. What increases the risk? You are more likely to develop a bacterial infection if: You do not take good care of your mouth and teeth (have poor oral  hygiene). You smoke. You do not drink enough water. You have a disease that causes dry mouth and dry eyes (Mikulicz syndrome or Sjgren syndrome). A viral infection is more likely to occur in children who do not get the measles, mumps, and rubella (MMR) vaccine. What are the signs or symptoms? The main sign of a salivary gland infection is sialadenitis, which is swelling in a salivary gland. You may have swelling in front of your ear, under your jaw, or under your tongue. Swelling may get worse when you eat and decrease after you eat. Other signs and symptoms include: Pain. Tenderness. Redness. Dry mouth. Bad taste in your mouth. Trouble chewing and swallowing. Fever. How is this diagnosed? This condition may be diagnosed based on: Your signs and symptoms. A physical exam. During the exam, your health care provider will look and feel inside your mouth to see whether a stone is blocking a salivary gland duct. Tests, such as: An X-ray to check for a stone. An ultrasound, CT scan, or MRI to look for an infected gland (abscess) and to rule out other causes of swelling. A culture and sensitivity test. In this test, a sample of pus is taken from the salivary gland by using a swab or a needle (aspiration). The sample is tested in a lab to determine the type of bacteria involved and which antibiotic medicines will work against it. You may need to see an ear, nose, and throat specialist (ENT or otolaryngologist) for diagnosis and treatment. How is this treated? Viral salivary gland infections usually clear up without treatment. Bacterial infections  are usually treated with antibiotic medicine. Severe infections that cause trouble swallowing may be treated with an IV antibiotic in the hospital. Other treatments may include: Probing and widening the salivary duct to let a stone pass. In some cases, a thin, flexible scope (endoscope) may be put into the duct to find a stone and remove it. Breaking up  a stone using sound waves. Draining an abscess with a needle. Surgery to: Remove a stone. Drain pus from an abscess. Remove a gland that has a bad or long-term infection. Follow these instructions at home: Medicines Take over-the-counter and prescription medicines only as told by your health care provider. If you were prescribed an antibiotic medicine, take it as told by your health care provider. Do not stop taking the antibiotic even if you start to feel better. To relieve discomfort Follow these instructions every few hours: Suck on a lemon candy or a vitamin C lozenge to prompt the flow of saliva. Put a warm, wet cloth (warmcompress) over the gland. Gently massage the gland. Gargle with a mixture of salt and water 3-4 times a day or as needed. To make salt water, completely dissolve -1 tsp (3-6 g) of salt in 1 cup (237 mL) of warm water. General instructions  Practice good oral hygiene by brushing and flossing your teeth after meals and before you go to bed. Drink enough fluid to keep your urine pale yellow. Do not use any products that contain nicotine or tobacco. These products include cigarettes, chewing tobacco, and vaping devices, such as e-cigarettes. If you need help quitting, ask your health care provider. Keep all follow-up visits. This is important. Contact a health care provider if: You have pain and swelling in your face, jaw, or mouth after eating. You keep having swelling in any of these places: In front of your ear. Under your jaw. Inside your mouth. Get help right away if: You have pain and swelling in your face, jaw, or mouth that suddenly gets worse. Your pain and swelling make it hard to swallow, talk, or breathe. These symptoms may be an emergency. Get help right away. Call 911. Do not wait to see if the symptoms will go away. Do not drive yourself to the hospital. Summary A salivary gland infection is an infection in one or more of the glands that make  saliva. Any salivary gland can get infected. This condition may be caused by bacteria or viruses. Salivary gland infections caused by a virus usually clear up without treatment. Bacterial infections are usually treated with antibiotic medicine. This information is not intended to replace advice given to you by your health care provider. Make sure you discuss any questions you have with your health care provider. Document Revised: 08/02/2021 Document Reviewed: 08/02/2021 Elsevier Patient Education  [place].

## 2022-10-22 NOTE — Telephone Encounter (Signed)
Pt returned Charlotte's call.

## 2022-10-23 ENCOUNTER — Telehealth: Payer: Self-pay | Admitting: Nurse Practitioner

## 2022-10-23 NOTE — Telephone Encounter (Signed)
Called pt back and made her aware that Baldo Ash ordered the referral.  Our Referrals coordinator will contact her.

## 2022-10-23 NOTE — Telephone Encounter (Signed)
Please call the pt . Pt stated charlotte is suppose to refer the pt to ENT

## 2023-01-02 ENCOUNTER — Ambulatory Visit: Payer: PRIVATE HEALTH INSURANCE | Admitting: Nurse Practitioner

## 2023-01-10 ENCOUNTER — Telehealth: Payer: Self-pay | Admitting: Nurse Practitioner

## 2023-01-10 NOTE — Telephone Encounter (Signed)
Please give the patient a call. Patient want to know if she has to fast for her appointment that is scheduled for tomorrow

## 2023-01-11 ENCOUNTER — Encounter: Payer: Self-pay | Admitting: Nurse Practitioner

## 2023-01-11 ENCOUNTER — Ambulatory Visit (INDEPENDENT_AMBULATORY_CARE_PROVIDER_SITE_OTHER): Payer: PRIVATE HEALTH INSURANCE | Admitting: Nurse Practitioner

## 2023-01-11 VITALS — BP 140/100 | HR 73 | Temp 98.6°F | Resp 16 | Ht 64.0 in | Wt 174.0 lb

## 2023-01-11 DIAGNOSIS — G4700 Insomnia, unspecified: Secondary | ICD-10-CM | POA: Insufficient documentation

## 2023-01-11 DIAGNOSIS — E7800 Pure hypercholesterolemia, unspecified: Secondary | ICD-10-CM

## 2023-01-11 DIAGNOSIS — K2100 Gastro-esophageal reflux disease with esophagitis, without bleeding: Secondary | ICD-10-CM

## 2023-01-11 DIAGNOSIS — F1010 Alcohol abuse, uncomplicated: Secondary | ICD-10-CM

## 2023-01-11 DIAGNOSIS — I10 Essential (primary) hypertension: Secondary | ICD-10-CM

## 2023-01-11 DIAGNOSIS — F5101 Primary insomnia: Secondary | ICD-10-CM | POA: Diagnosis not present

## 2023-01-11 DIAGNOSIS — Z1211 Encounter for screening for malignant neoplasm of colon: Secondary | ICD-10-CM

## 2023-01-11 DIAGNOSIS — E876 Hypokalemia: Secondary | ICD-10-CM

## 2023-01-11 DIAGNOSIS — R7989 Other specified abnormal findings of blood chemistry: Secondary | ICD-10-CM

## 2023-01-11 LAB — COMPREHENSIVE METABOLIC PANEL
ALT: 60 U/L — ABNORMAL HIGH (ref 0–35)
AST: 53 U/L — ABNORMAL HIGH (ref 0–37)
Albumin: 3.9 g/dL (ref 3.5–5.2)
Alkaline Phosphatase: 52 U/L (ref 39–117)
BUN: 13 mg/dL (ref 6–23)
CO2: 26 mEq/L (ref 19–32)
Calcium: 9.1 mg/dL (ref 8.4–10.5)
Chloride: 106 mEq/L (ref 96–112)
Creatinine, Ser: 0.75 mg/dL (ref 0.40–1.20)
GFR: 85.82 mL/min (ref >60.00)
Glucose, Bld: 94 mg/dL (ref 70–99)
Potassium: 3.3 mEq/L — ABNORMAL LOW (ref 3.5–5.1)
Sodium: 142 mEq/L (ref 135–145)
Total Bilirubin: 1.7 mg/dL — ABNORMAL HIGH (ref 0.2–1.2)
Total Protein: 7.2 g/dL (ref 6.0–8.3)

## 2023-01-11 LAB — LIPID PANEL
Cholesterol: 255 mg/dL — ABNORMAL HIGH (ref 0–200)
HDL: 74.8 mg/dL (ref >39.00)
LDL Cholesterol: 165 mg/dL — ABNORMAL HIGH (ref 0–99)
NonHDL: 180.4
Total CHOL/HDL Ratio: 3
Triglycerides: 77 mg/dL (ref 0.0–149.0)
VLDL: 15.4 mg/dL (ref 0.0–40.0)

## 2023-01-11 MED ORDER — SUCRALFATE 1 G PO TABS: 1.0000 g | ORAL_TABLET | Freq: Every day | ORAL | 0 refills | Status: DC | PRN

## 2023-01-11 MED ORDER — AMLODIPINE BESYLATE 5 MG PO TABS: 5.0000 mg | ORAL_TABLET | Freq: Every evening | ORAL | 3 refills | Status: AC

## 2023-01-11 NOTE — Progress Notes (Signed)
Established Patient Visit  Patient: Jasmine Watson   DOB: Jan 01, 1961   62 y.o. Female  MRN: [id number] Visit Date: 01/11/2023  Subjective:    Chief Complaint  Patient presents with   Hypertension    Fasting    Hypertension   Gastroesophageal reflux disease with esophagitis without hemorrhage Resolved  Use of carafate prn  Insomnia Chronic fragmented sleep.  Advised to minimize use of ETOH Try melatonin 5-10mg  or unisom or magnesium glycinate at bedtime as needed  Hypercholesterolemia Repeat lipid panel Advised about importance of DASH diet and minimize ETOH consumption  Primary hypertension BP not at goal due to amlodipine non compliance. No BP check at home BP Readings from Last 3 Encounters:  01/11/23 (!) 140/100  10/22/22 128/80  10/04/22 122/68    Advised about the importance of med compliance and possible complications of uncontrolled HTN. Advised to monitor BP at hoem and send BP reading viq mychart in 1week F/up in 61month   Wt Readings from Last 3 Encounters:  01/11/23 174 lb (78.9 kg)  10/22/22 198 lb (89.8 kg)  10/04/22 174 lb (78.9 kg)    Reviewed medical, surgical, and social history today  Medications: Outpatient Medications Prior to Visit  Medication Sig   albuterol (VENTOLIN HFA) 108 (90 Base) MCG/ACT inhaler Inhale 1-2 puffs into the lungs every 6 (six) hours as needed for wheezing or shortness of breath.   clindamycin (CLEOCIN) 300 MG capsule Take 1 capsule (300 mg total) by mouth 3 (three) times daily.   folic acid (FOLVITE) 1 MG tablet Take 1 mg by mouth daily.    losartan (COZAAR) 100 MG tablet Take 1 tablet (100 mg total) by mouth in the morning.   methotrexate (RHEUMATREX) 2.5 MG tablet Take 2.5 mg by mouth once a week.    triamcinolone ointment (KENALOG) 0.1 % Apply topically 2 (two) times daily.   [DISCONTINUED] amLODipine (NORVASC) 5 MG tablet Take 1 tablet (5 mg total) by mouth every evening.   [DISCONTINUED]  ciprofloxacin (CIPRO) 750 MG tablet Take 1 tablet (750 mg total) by mouth 2 (two) times daily. (Patient not taking: Reported on 01/11/2023)   [DISCONTINUED] sucralfate (CARAFATE) 1 g tablet Take 1 tablet (1 g total) by mouth 3 (three) times daily before meals. (Patient not taking: Reported on 01/11/2023)   No facility-administered medications prior to visit.   Reviewed past medical and social history.   ROS per HPI above      Objective:  BP (!) 140/100   Pulse 73   Temp 98.6 F (37 C) (Temporal)   Resp 16   Ht 5\' 4"  (1.626 m)   Wt 174 lb (78.9 kg)   SpO2 97%   BMI 29.87 kg/m      Physical Exam Cardiovascular:     Rate and Rhythm: Normal rate and regular rhythm.     Pulses: Normal pulses.     Heart sounds: Normal heart sounds.  Pulmonary:     Effort: Pulmonary effort is normal.     Breath sounds: Normal breath sounds.  Neurological:     Mental Status: She is alert and oriented to person, place, and time.     No results found for any visits on 01/11/23.    Assessment & Plan:    Problem List Items Addressed This Visit       Cardiovascular and Mediastinum   Primary hypertension - Primary    BP not at  goal due to amlodipine non compliance. No BP check at home BP Readings from Last 3 Encounters:  01/11/23 (!) 140/100  10/22/22 128/80  10/04/22 122/68    Advised about the importance of med compliance and possible complications of uncontrolled HTN. Advised to monitor BP at hoem and send BP reading viq mychart in 1week F/up in 42month       Relevant Medications   amLODipine (NORVASC) 5 MG tablet   Other Relevant Orders   Comprehensive metabolic panel     Digestive   Gastroesophageal reflux disease with esophagitis without hemorrhage    Resolved  Use of carafate prn      Relevant Medications   sucralfate (CARAFATE) 1 g tablet     Other   Hypercholesterolemia    Repeat lipid panel Advised about importance of DASH diet and minimize ETOH consumption       Relevant Medications   amLODipine (NORVASC) 5 MG tablet   Other Relevant Orders   Lipid panel   Comprehensive metabolic panel   Insomnia    Chronic fragmented sleep.  Advised to minimize use of ETOH Try melatonin 5-10mg  or unisom or magnesium glycinate at bedtime as needed      Other Visit Diagnoses     Colon cancer screening       Relevant Orders   Cologuard      Return in about 6 months (around 07/14/2023) for HTN, hyperlipidemia (fasting).     Alysia Penna, NP

## 2023-01-11 NOTE — Assessment & Plan Note (Signed)
Resolved  Use of carafate prn

## 2023-01-11 NOTE — Assessment & Plan Note (Signed)
BP not at goal due to amlodipine non compliance. No BP check at home BP Readings from Last 3 Encounters:  01/11/23 (!) 140/100  10/22/22 128/80  10/04/22 122/68    Advised about the importance of med compliance and possible complications of uncontrolled HTN. Advised to monitor BP at hoem and send BP reading viq mychart in 1week F/up in 53month

## 2023-01-11 NOTE — Assessment & Plan Note (Signed)
Repeat lipid panel Advised about importance of DASH diet and minimize ETOH consumption

## 2023-01-11 NOTE — Patient Instructions (Addendum)
Try melatonin 5-10mg  or unisom or magnesium glycinate at bedtime as needed  Take meds as prescribed Monitor BP at home 2-3x/week Send BP readings via mychart in 1week. Maintain low salt diet  Go to lab

## 2023-01-11 NOTE — Assessment & Plan Note (Signed)
Chronic fragmented sleep.  Advised to minimize use of ETOH Try melatonin 5-10mg  or unisom or magnesium glycinate at bedtime as needed

## 2023-01-17 DIAGNOSIS — F1010 Alcohol abuse, uncomplicated: Secondary | ICD-10-CM | POA: Insufficient documentation

## 2023-01-17 MED ORDER — POTASSIUM CHLORIDE CRYS ER 20 MEQ PO TBCR: 40.0000 meq | EXTENDED_RELEASE_TABLET | Freq: Once | ORAL | 0 refills | Status: DC

## 2023-01-17 NOTE — Addendum Note (Signed)
Addended by: Michaela Corner on: 01/17/2023 03:43 PM   Modules accepted: Orders

## 2023-03-05 ENCOUNTER — Encounter: Payer: Self-pay | Admitting: Nurse Practitioner

## 2023-03-06 ENCOUNTER — Other Ambulatory Visit (INDEPENDENT_AMBULATORY_CARE_PROVIDER_SITE_OTHER): Payer: PRIVATE HEALTH INSURANCE

## 2023-03-06 ENCOUNTER — Encounter: Payer: Self-pay | Admitting: Internal Medicine

## 2023-03-06 ENCOUNTER — Ambulatory Visit: Payer: PRIVATE HEALTH INSURANCE | Admitting: Internal Medicine

## 2023-03-06 VITALS — BP 124/82 | HR 84 | Ht 63.25 in | Wt 176.1 lb

## 2023-03-06 DIAGNOSIS — Z1211 Encounter for screening for malignant neoplasm of colon: Secondary | ICD-10-CM | POA: Diagnosis not present

## 2023-03-06 DIAGNOSIS — Z8719 Personal history of other diseases of the digestive system: Secondary | ICD-10-CM

## 2023-03-06 DIAGNOSIS — R634 Abnormal weight loss: Secondary | ICD-10-CM

## 2023-03-06 DIAGNOSIS — R7989 Other specified abnormal findings of blood chemistry: Secondary | ICD-10-CM | POA: Diagnosis not present

## 2023-03-06 DIAGNOSIS — K449 Diaphragmatic hernia without obstruction or gangrene: Secondary | ICD-10-CM

## 2023-03-06 DIAGNOSIS — K219 Gastro-esophageal reflux disease without esophagitis: Secondary | ICD-10-CM

## 2023-03-06 DIAGNOSIS — R1310 Dysphagia, unspecified: Secondary | ICD-10-CM

## 2023-03-06 LAB — CBC WITH DIFFERENTIAL/PLATELET
Basophils Absolute: 0 10*3/uL (ref 0.0–0.1)
Basophils Relative: 0.6 % (ref 0.0–3.0)
Eosinophils Absolute: 0 10*3/uL (ref 0.0–0.7)
Eosinophils Relative: 1 % (ref 0.0–5.0)
HCT: 42.2 % (ref 36.0–46.0)
Hemoglobin: 13.8 g/dL (ref 12.0–15.0)
Lymphocytes Relative: 30.7 % (ref 12.0–46.0)
Lymphs Abs: 1 10*3/uL (ref 0.7–4.0)
MCHC: 32.7 g/dL (ref 30.0–36.0)
MCV: 90.6 fl (ref 78.0–100.0)
Monocytes Absolute: 0.4 10*3/uL (ref 0.1–1.0)
Monocytes Relative: 12.3 % — ABNORMAL HIGH (ref 3.0–12.0)
Neutro Abs: 1.9 10*3/uL (ref 1.4–7.7)
Neutrophils Relative %: 55.4 % (ref 43.0–77.0)
Platelets: 189 10*3/uL (ref 150.0–400.0)
RBC: 4.66 Mil/uL (ref 3.87–5.11)
RDW: 12.8 % (ref 11.5–15.5)
WBC: 3.4 10*3/uL — ABNORMAL LOW (ref 4.0–10.5)

## 2023-03-06 LAB — PROTIME-INR
INR: 1.1 ratio — ABNORMAL HIGH (ref 0.8–1.0)
Prothrombin Time: 12.1 s (ref 9.6–13.1)

## 2023-03-06 MED ORDER — OMEPRAZOLE 20 MG PO CPDR: 20.0000 mg | DELAYED_RELEASE_CAPSULE | Freq: Every day | ORAL | 3 refills | Status: DC

## 2023-03-06 MED ORDER — NA SULFATE-K SULFATE-MG SULF 17.5-3.13-1.6 GM/177ML PO SOLN: 1.0000 | Freq: Once | ORAL | 0 refills | Status: AC

## 2023-03-06 NOTE — Patient Instructions (Signed)
Your provider has requested that you go to the basement level for lab work before leaving today. Press "B" on the elevator. The lab is located at the first door on the left as you exit the elevator.  You have been scheduled for an endoscopy and colonoscopy. Please follow the written instructions given to you at your visit today.  Please pick up your prep supplies at the pharmacy within the next 1-3 days.  If you use inhalers (even only as needed), please bring them with you on the day of your procedure.  DO NOT TAKE 7 DAYS PRIOR TO TEST- Trulicity (dulaglutide) Ozempic, Wegovy (semaglutide) Mounjaro (tirzepatide) Bydureon Bcise (exanatide extended release)  DO NOT TAKE 1 DAY PRIOR TO YOUR TEST Rybelsus (semaglutide) Adlyxin (lixisenatide) Victoza (liraglutide) Byetta (exanatide) ______________________________________________________  You have been scheduled for an abdominal ultrasound with elastography at [facility] Radiology (1st floor). Your appointment is scheduled for 03/11/23 at 10:00 am. Please arrive 30 minutes prior to your scheduled appointment for registration purposes. Make certain not to have anything to eat or drink 6 hours prior to your procedure. Should you need to reschedule your appointment, you may contact radiology at [phone].  Reduce alcohol use

## 2023-03-06 NOTE — Progress Notes (Signed)
Chief Complaint: Elevated LFTs  HPI : 62 year old female with history of SLE, PE, and asthma presents with elevated LFTs  She is here to talk about elevated liver tests. She has previously seen Dr. Christella Hartigan for this back in 2020. She is no longer on methotrexate because she needs to meet with her dermatologist to get the re-prescribed. The last time she took methotrexate was a couple of months ago. She is drinking 2-4 alcoholic beverages per day. Sometimes her stools are soft at times. Sometimes if she eats a lot food, she will have some loose stools. Denies blood in the stools. If she drinks more alcohol, she will have more ab pain in the mid-abdomen. Denies N&V. Sometimes she has trouble swallowing. She will feel choked in her throat. Endorses chest burning and regurgitation pretty much all the time. Denies odynophagia. She lost 10-20 lbs over this year. This weight loss has been unintentional. Denies family history of colon cancer and liver disease. She is not on any blood thinners.  Wt Readings from Last 3 Encounters:  03/06/23 176 lb 2 oz (79.9 kg)  01/11/23 174 lb (78.9 kg)  10/22/22 198 lb (89.8 kg)   Past Medical History:  Diagnosis Date   Childhood asthma    History of chicken pox    History of pulmonary embolism 10/2007   Hoarseness 12/12/2017   Hypertension    Lichen planus pigmentosus 04/11/2016   Lupus (HCC)    skin   Past Surgical History:  Procedure Laterality Date   TOTAL ABDOMINAL HYSTERECTOMY W/ BILATERAL SALPINGOOPHORECTOMY     secondary to fibroids   Family History  Problem Relation Age of Onset   Prostate cancer Father        Survivor   Diabetes Father    Hypertension Father    Hypertension Sister    Cancer Brother        Renal cell carcinoma   Coronary artery disease Other    Colon cancer Neg Hx    Breast cancer Neg Hx    Stomach cancer Neg Hx    Social History   Tobacco Use   Smoking status: Never   Smokeless tobacco: Never  Vaping Use   Vaping  status: Never Used  Substance Use Topics   Alcohol use: Yes    Alcohol/week: 6.0 standard drinks of alcohol    Types: 6 Shots of liquor per week   Drug use: No   Current Outpatient Medications  Medication Sig Dispense Refill   amLODipine (NORVASC) 5 MG tablet Take 1 tablet (5 mg total) by mouth every evening. 90 tablet 3   folic acid (FOLVITE) 1 MG tablet Take 1 mg by mouth daily.      losartan (COZAAR) 100 MG tablet Take 1 tablet (100 mg total) by mouth in the morning. 90 tablet 3   methotrexate (RHEUMATREX) 2.5 MG tablet Take 2.5 mg by mouth once a week.      triamcinolone ointment (KENALOG) 0.1 % Apply topically 2 (two) times daily.     No current facility-administered medications for this visit.   Allergies  Allergen Reactions   Shellfish Allergy Anaphylaxis   Shrimp Extract Hives   Clarithromycin Rash   Metronidazole Rash     Review of Systems: All systems reviewed and negative except where noted in HPI.   Physical Exam: BP 124/82 (BP Location: Left Arm, Patient Position: Sitting, Cuff Size: Normal)   Pulse 84   Ht 5' 3.25" (1.607 m)   Wt 176 lb  2 oz (79.9 kg)   BMI 30.95 kg/m  Constitutional: Pleasant,well-developed, female in no acute distress. HEENT: Normocephalic and atraumatic. Conjunctivae are normal. No scleral icterus. Cardiovascular: Normal rate, regular rhythm.  Pulmonary/chest: Effort normal and breath sounds normal. No wheezing, rales or rhonchi. Abdominal: Soft, nondistended, tender in the epigastric area and lower abdomen. Bowel sounds active throughout. There are no masses palpable. No hepatomegaly. Extremities: No edema Neurological: Alert and oriented to person place and time. Skin: Skin is warm and dry. No rashes noted. Psychiatric: Normal mood and affect. Behavior is normal.  Labs 03/2019: Hep A ab NR. Hep B surface antigen NR. Hep B surface antibody NR. Hep C antibody NR. Iron studies normal. ANA positive at 1:1280. AMA negative.  Labs 06/2022:  CBC nml.   Labs 12/2022: CMP with elevated AST 53, ALT 60, elevated total bilirubin of 1.7.  CT A/P w/contrast 07/05/22: IMPRESSION: Focal diverticulitis is seen involving the proximal sigmoid colon. No definite abscess formation is noted. Small sliding-type hiatal hernia  Colonoscopy 09/05/12 with mild left sided diverticulosis  ASSESSMENT AND PLAN: Elevated LFTs Colon cancer screening History of diverticulitis GERD Dysphagia Epigastric and lower ab pain Hiatal hernia Weight loss Patient presents for follow up of elevated LFTs. Her FIB-4 is indeterminate so she warrants further evaluation with elastography. Will update some of her labs as well since patient has history of positive ANA, which was attributed to her SLE diagnosis. Previously it was thought that her elevated LFTs are due to alcohol use and/or methotrexate use. I encouraged the patient to try to reduce her alcohol use at this time. Patient also has other GI symptoms such as uncontrolled GERD, dysphagia, abdominal pain, and weight loss. Will plan for further evaluation with an EGD. For improved GERD control, will start her on some PPI therapy. She is also due for a colonoscopy for colon cancer screening. Her last colonoscopy was in 2014, which was normal. Based upon her CT in 06/2022, she was diagnosed with diverticulitis and thus needs a colonoscopy instead of a Cologuard test. - Encourage alcohol cessation - GERD handout - Check CBC, PT/INR, ASMA, IgG - Ab U/S with elastography - Start omeprazole 20 mg QD - EGD/colonoscopy [facility]  Eulah Pont, MD  I spent 60 minutes of time, including in depth chart review, independent review of results as outlined above, communicating results with the patient directly, face-to-face time with the patient, coordinating care, ordering studies and medications as appropriate, and documentation.

## 2023-03-11 ENCOUNTER — Ambulatory Visit (HOSPITAL_COMMUNITY)
Admission: RE | Admit: 2023-03-11 | Discharge: 2023-03-11 | Disposition: A | Discharge status: Home / Self Care | Payer: PRIVATE HEALTH INSURANCE | Source: Ambulatory Visit | Attending: Internal Medicine | Admitting: Internal Medicine

## 2023-03-11 DIAGNOSIS — R1310 Dysphagia, unspecified: Secondary | ICD-10-CM | POA: Diagnosis present

## 2023-03-11 DIAGNOSIS — Z8719 Personal history of other diseases of the digestive system: Secondary | ICD-10-CM

## 2023-03-11 DIAGNOSIS — R634 Abnormal weight loss: Secondary | ICD-10-CM | POA: Diagnosis present

## 2023-03-11 DIAGNOSIS — K219 Gastro-esophageal reflux disease without esophagitis: Secondary | ICD-10-CM

## 2023-03-11 DIAGNOSIS — Z1211 Encounter for screening for malignant neoplasm of colon: Secondary | ICD-10-CM

## 2023-03-11 DIAGNOSIS — R7989 Other specified abnormal findings of blood chemistry: Secondary | ICD-10-CM

## 2023-03-11 DIAGNOSIS — K449 Diaphragmatic hernia without obstruction or gangrene: Secondary | ICD-10-CM

## 2023-03-12 LAB — IGG: IgG (Immunoglobin G), Serum: 1584 mg/dL — ABNORMAL HIGH (ref 600–1540)

## 2023-03-12 LAB — ANTI-SMOOTH MUSCLE ANTIBODY, IGG: Actin (Smooth Muscle) Antibody (IGG): <20 U (ref <20)

## 2023-03-22 NOTE — Progress Notes (Signed)
Spoke to the patient about the results of her elastography, which shows that she does not have advanced liver disease at this time.  Her liver ultrasound does show fatty liver infiltration.  However her liver test do remain elevated so I did counsel the patient to try to cut down on her alcohol use.

## 2023-03-25 ENCOUNTER — Other Ambulatory Visit: Payer: Self-pay | Admitting: *Deleted

## 2023-03-25 DIAGNOSIS — L438 Other lichen planus: Secondary | ICD-10-CM

## 2023-03-25 DIAGNOSIS — M329 Systemic lupus erythematosus, unspecified: Secondary | ICD-10-CM

## 2023-03-26 ENCOUNTER — Encounter: Payer: Self-pay | Admitting: Certified Registered Nurse Anesthetist

## 2023-03-28 ENCOUNTER — Encounter: Payer: Self-pay | Admitting: Internal Medicine

## 2023-03-28 ENCOUNTER — Ambulatory Visit: Payer: PRIVATE HEALTH INSURANCE | Admitting: Internal Medicine

## 2023-03-28 VITALS — BP 173/94 | HR 65 | Temp 98.1°F | Resp 13 | Ht 63.25 in | Wt 176.0 lb

## 2023-03-28 DIAGNOSIS — Z1211 Encounter for screening for malignant neoplasm of colon: Secondary | ICD-10-CM

## 2023-03-28 DIAGNOSIS — K2980 Duodenitis without bleeding: Secondary | ICD-10-CM

## 2023-03-28 DIAGNOSIS — K219 Gastro-esophageal reflux disease without esophagitis: Secondary | ICD-10-CM

## 2023-03-28 DIAGNOSIS — K2950 Unspecified chronic gastritis without bleeding: Secondary | ICD-10-CM

## 2023-03-28 DIAGNOSIS — D123 Benign neoplasm of transverse colon: Secondary | ICD-10-CM | POA: Diagnosis not present

## 2023-03-28 DIAGNOSIS — K635 Polyp of colon: Secondary | ICD-10-CM

## 2023-03-28 DIAGNOSIS — D122 Benign neoplasm of ascending colon: Secondary | ICD-10-CM

## 2023-03-28 DIAGNOSIS — R1310 Dysphagia, unspecified: Secondary | ICD-10-CM

## 2023-03-28 DIAGNOSIS — K317 Polyp of stomach and duodenum: Secondary | ICD-10-CM | POA: Diagnosis not present

## 2023-03-28 DIAGNOSIS — R1013 Epigastric pain: Secondary | ICD-10-CM

## 2023-03-28 DIAGNOSIS — D125 Benign neoplasm of sigmoid colon: Secondary | ICD-10-CM

## 2023-03-28 MED ORDER — SODIUM CHLORIDE 0.9 % IV SOLN: 500.0000 mL | Freq: Once | INTRAVENOUS | Status: DC

## 2023-03-28 MED ORDER — OMEPRAZOLE 40 MG PO CPDR: DELAYED_RELEASE_CAPSULE | ORAL | 3 refills | Status: DC

## 2023-03-28 NOTE — Progress Notes (Signed)
Called to room to assist during endoscopic procedure.  Patient ID and intended procedure confirmed with present staff. Received instructions for my participation in the procedure from the performing physician.  

## 2023-03-28 NOTE — Op Note (Signed)
[facility] Patient Name: Jasmine Watson Procedure Date: 03/28/2023 9:04 AM MRN: [id number] Endoscopist: Madelyn Brunner [place] , , [id number] Age: 62 Referring MD:  Date of Birth: [birth year] Gender: Female Account #: [id number] Procedure:                Colonoscopy Indications:              Screening for colorectal malignant neoplasm Medicines:                Monitored Anesthesia Care Procedure:                Pre-Anesthesia Assessment:                           - Prior to the procedure, a History and Physical                            was performed, and patient medications and                            allergies were reviewed. The patient's tolerance of                            previous anesthesia was also reviewed. The risks                            and benefits of the procedure and the sedation                            options and risks were discussed with the patient.                            All questions were answered, and informed consent                            was obtained. Prior Anticoagulants: The patient has                            taken no anticoagulant or antiplatelet agents. ASA                            Grade Assessment: III - A patient with severe                            systemic disease. After reviewing the risks and                            benefits, the patient was deemed in satisfactory                            condition to undergo the procedure.                           After obtaining informed consent, the colonoscope  was passed under direct vision. Throughout the                            procedure, the patient's blood pressure, pulse, and                            oxygen saturations were monitored continuously. The                            Olympus Scope [id number] was introduced through the                            anus and advanced to the the terminal ileum. The                             colonoscopy was performed without difficulty. The                            patient tolerated the procedure well. The quality                            of the bowel preparation was good. The terminal                            ileum, ileocecal valve, appendiceal orifice, and                            rectum were photographed. Scope In: 9:20:08 AM Scope Out: 9:32:57 AM Scope Withdrawal Time: 0 hours 10 minutes 43 seconds  Total Procedure Duration: 0 hours 12 minutes 49 seconds  Findings:                 The terminal ileum appeared normal.                           Two sessile polyps were found in the transverse                            colon and ascending colon. The polyps were 4 to 5                            mm in size. These polyps were removed with a cold                            snare. Resection and retrieval were complete.                           A 3 mm polyp was found in the sigmoid colon. The                            polyp was sessile. The polyp was removed with a  cold snare. Resection and retrieval were complete.                           Multiple diverticula were found in the sigmoid                            colon.                           Non-bleeding internal hemorrhoids were found during                            retroflexion. Complications:            No immediate complications. Estimated Blood Loss:     Estimated blood loss was minimal. Impression:               - The examined portion of the ileum was normal.                           - Two 4 to 5 mm polyps in the transverse colon and                            in the ascending colon, removed with a cold snare.                            Resected and retrieved.                           - One 3 mm polyp in the sigmoid colon, removed with                            a cold snare. Resected and retrieved.                           - Diverticulosis in the sigmoid colon.                            - Non-bleeding internal hemorrhoids. Recommendation:           - Discharge patient to home (with escort).                           - Await pathology results.                           - The findings and recommendations were discussed                            with the patient. Dr Particia Lather "[place]ray" [place],  03/28/2023 9:44:08 AM

## 2023-03-28 NOTE — Op Note (Signed)
[facility] Patient Name: Jasmine Watson Procedure Date: 03/28/2023 9:06 AM MRN: [id number] Endoscopist: Madelyn Brunner [place] , , [id number] Age: 62 Referring MD:  Date of Birth: Feb 08, 1961 Gender: Female Account #: [id number] Procedure:                Upper GI endoscopy Indications:              Epigastric abdominal pain, Dysphagia, Heartburn Medicines:                Monitored Anesthesia Care Procedure:                Pre-Anesthesia Assessment:                           - Prior to the procedure, a History and Physical                            was performed, and patient medications and                            allergies were reviewed. The patient's tolerance of                            previous anesthesia was also reviewed. The risks                            and benefits of the procedure and the sedation                            options and risks were discussed with the patient.                            All questions were answered, and informed consent                            was obtained. Prior Anticoagulants: The patient has                            taken no anticoagulant or antiplatelet agents. ASA                            Grade Assessment: III - A patient with severe                            systemic disease. After reviewing the risks and                            benefits, the patient was deemed in satisfactory                            condition to undergo the procedure.                           After obtaining informed consent, the endoscope was  passed under direct vision. Throughout the                            procedure, the patient's blood pressure, pulse, and                            oxygen saturations were monitored continuously. The                            GIF [id number] #[id number] was introduced through the                            mouth, and advanced to the second part of duodenum.                             The upper GI endoscopy was accomplished without                            difficulty. The patient tolerated the procedure                            well. Scope In: Scope Out: Findings:                 The examined esophagus was normal. A guidewire was                            placed and the scope was withdrawn. Dilation was                            performed with a Savary dilator with mild                            resistance at 19 mm. Biopsies were taken with a                            cold forceps for histology.                           A hiatal hernia was present.                           Localized mildly erythematous mucosa without                            bleeding was found in the gastric antrum. Biopsies                            were taken with a cold forceps for histology.                           Multiple diminutive sessile polyps with no bleeding                            were found in the  duodenal bulb. Biopsies were                            taken with a cold forceps for histology. Complications:            No immediate complications. Estimated Blood Loss:     Estimated blood loss was minimal. Impression:               - Normal esophagus. Dilated. Biopsied.                           - Hiatal hernia.                           - Erythematous mucosa in the antrum. Biopsied.                           - Multiple duodenal polyps. Biopsied. Recommendation:           - Await pathology results.                           - Trial of Prilosec (omeprazole) 40 mg PO BID for 8                            weeks, then decrease to QD.                           - Return to GI clinic in 2-3 months.                           - Perform a colonoscopy today. Dr Particia Lather "Alan Ripper" Leonides Schanz,  03/28/2023 9:39:51 AM

## 2023-03-28 NOTE — Progress Notes (Signed)
Report given to PACU, vss 

## 2023-03-28 NOTE — Progress Notes (Signed)
0849 Robinul 0.1 mg IV given due large amount of secretions upon assessment.  MD made aware, vss  

## 2023-03-28 NOTE — Patient Instructions (Addendum)
YOU HAD AN ENDOSCOPIC PROCEDURE TODAY AT THE [facility]:   Refer to the procedure report that was given to you for any specific questions about what was found during the examination.  If the procedure report does not answer your questions, please call your gastroenterologist to clarify.  If you requested that your care partner not be given the details of your procedure findings, then the procedure report has been included in a sealed envelope for you to review at your convenience later.  YOU SHOULD EXPECT: Some feelings of bloating in the abdomen. Passage of more gas than usual.  Walking can help get rid of the air that was put into your GI tract during the procedure and reduce the bloating. If you had a lower endoscopy (such as a colonoscopy or flexible sigmoidoscopy) you may notice spotting of blood in your stool or on the toilet paper. If you underwent a bowel prep for your procedure, you may not have a normal bowel movement for a few days.  Please Note:  You might notice some irritation and congestion in your nose or some drainage.  This is from the oxygen used during your procedure.  There is no need for concern and it should clear up in a day or so.  SYMPTOMS TO REPORT IMMEDIATELY:  Following lower endoscopy (colonoscopy or flexible sigmoidoscopy):  Excessive amounts of blood in the stool  Significant tenderness or worsening of abdominal pains  Swelling of the abdomen that is new, acute  Fever of 100F or higher  Following upper endoscopy (EGD)  Vomiting of blood or coffee ground material  New chest pain or pain under the shoulder blades  Painful or persistently difficult swallowing  New shortness of breath  Fever of 100F or higher  Black, tarry-looking stools  For urgent or emergent issues, a gastroenterologist can be reached at any hour by calling [phone]. Do not use MyChart messaging for urgent concerns.    DIET:  We do recommend a small meal at first, but  then you may proceed to your regular diet.  Drink plenty of fluids but you should avoid alcoholic beverages for 24 hours.  MEDICATIONS: Continue present medications. Trial of Prilosec (omeprazole) 40 mg by mouth twice daily for 8 weeks, then decrease to once daily thereafter.  Please see handouts given to you by your recovery nurse: Hiatal Hernia, Polyps, Diverticulosis, Hemorrhoids.  FOLLOW UP: Await pathology results. Return to Dr. Derek Mound office for an appointment in 2-3 months. Appointment scheduled for 07/02/23 at 0850 with Dr. Leonides Schanz in her office (3rd floor).  Thank you for allowing [place] to provide for your healthcare needs today.  ACTIVITY:  You should plan to take it easy for the rest of today and you should NOT DRIVE or use heavy machinery until tomorrow (because of the sedation medicines used during the test).    FOLLOW UP: Our staff will call the number listed on your records the next business day following your procedure.  We will call around 7:15- 8:00 am to check on you and address any questions or concerns that you may have regarding the information given to you following your procedure. If we do not reach you, we will leave a message.     If any biopsies were taken you will be contacted by phone or by letter within the next 1-3 weeks.  Please call us at [phone] if you have not heard about the biopsies in 3 weeks.    SIGNATURES/CONFIDENTIALITY: You and/or your  care partner have signed paperwork which will be entered into your electronic medical record.  These signatures attest to the fact that that the information above on your After Visit Summary has been reviewed and is understood.  Full responsibility of the confidentiality of this discharge information lies with you and/or your care-partner.

## 2023-03-28 NOTE — Progress Notes (Signed)
GASTROENTEROLOGY PROCEDURE H&P NOTE   Primary Care Physician: Anne Ng, NP    Reason for Procedure:   Dysphagia, epigastric ab pain, GERD, colon cancer screening  Plan:    EGD/colonoscopy  Patient is appropriate for endoscopic procedure(s) in the ambulatory ([facility]) setting.  The nature of the procedure, as well as the risks, benefits, and alternatives were carefully and thoroughly reviewed with the patient. Ample time for discussion and questions allowed. The patient understood, was satisfied, and agreed to proceed.     HPI: Jasmine Watson is a 62 y.o. female who presents for EGD/colonoscopy for evaluation of dysphagia, epigastric ab pain, GERD, and colon cancer screening .  Patient was most recently seen in the Gastroenterology Clinic on 03/06/23.  No interval change in medical history since that appointment. Please refer to that note for full details regarding GI history and clinical presentation.   Past Medical History:  Diagnosis Date   Childhood asthma    History of chicken pox    History of pulmonary embolism 10/2007   Hoarseness 12/12/2017   Hypertension    Lichen planus pigmentosus 04/11/2016   Lupus (HCC)    skin    Past Surgical History:  Procedure Laterality Date   TOTAL ABDOMINAL HYSTERECTOMY W/ BILATERAL SALPINGOOPHORECTOMY     secondary to fibroids    Prior to Admission medications   Medication Sig Start Date End Date Taking? Authorizing Provider  amLODipine (NORVASC) 5 MG tablet Take 1 tablet (5 mg total) by mouth every evening. 01/11/23  Yes Nche, Bonna Gains, NP  folic acid (FOLVITE) 1 MG tablet Take 1 mg by mouth daily.  10/15/17  Yes [provider]  losartan (COZAAR) 100 MG tablet Take 1 tablet (100 mg total) by mouth in the morning. 06/20/22  Yes Nche, Bonna Gains, NP  omeprazole (PRILOSEC) 20 MG capsule Take 1 capsule (20 mg total) by mouth daily. 03/06/23  Yes Imogene Burn, MD  triamcinolone ointment (KENALOG) 0.1 % Apply  topically 2 (two) times daily. 09/19/22  Yes [provider]  methotrexate (RHEUMATREX) 2.5 MG tablet Take 2.5 mg by mouth once a week.  10/15/17   [provider]    Current Outpatient Medications  Medication Sig Dispense Refill   amLODipine (NORVASC) 5 MG tablet Take 1 tablet (5 mg total) by mouth every evening. 90 tablet 3   folic acid (FOLVITE) 1 MG tablet Take 1 mg by mouth daily.      losartan (COZAAR) 100 MG tablet Take 1 tablet (100 mg total) by mouth in the morning. 90 tablet 3   omeprazole (PRILOSEC) 20 MG capsule Take 1 capsule (20 mg total) by mouth daily. 90 capsule 3   triamcinolone ointment (KENALOG) 0.1 % Apply topically 2 (two) times daily.     methotrexate (RHEUMATREX) 2.5 MG tablet Take 2.5 mg by mouth once a week.      Current Facility-Administered Medications  Medication Dose Route Frequency Provider Last Rate Last Admin   0.9 %  sodium chloride infusion  500 mL Intravenous Once Imogene Burn, MD        Allergies as of 03/28/2023 - Review Complete 03/28/2023  Allergen Reaction Noted   Shellfish allergy Anaphylaxis 02/23/2013   Shrimp extract Hives 12/09/2014   Clarithromycin Rash 06/20/2020   Metronidazole Rash 06/20/2020    Family History  Problem Relation Age of Onset   Prostate cancer Father        Survivor   Diabetes Father    Hypertension Father  Hypertension Sister    Cancer Brother        Renal cell carcinoma   Coronary artery disease Other    Colon cancer Neg Hx    Breast cancer Neg Hx    Stomach cancer Neg Hx     Social History   Socioeconomic History   Marital status: Married    Spouse name: Not on file   Number of children: 2   Years of education: Not on file   Highest education level: Not on file  Occupational History   Occupation: Nursing ass't [employer]    Employer: [employer]  Tobacco Use   Smoking status: Never   Smokeless tobacco: Never  Vaping Use   Vaping status: Never Used  Substance and  Sexual Activity   Alcohol use: Yes    Alcohol/week: 6.0 standard drinks of alcohol    Types: 6 Shots of liquor per week   Drug use: No   Sexual activity: Not on file  Other Topics Concern   Not on file  Social History Narrative   HSG, 1 semester college   Married '92   1 son - '93, 1 daughter - '98         Social Determinants of Corporate investment banker Strain: Not on Ship broker Insecurity: Not on file  Transportation Needs: Not on file  Physical Activity: Not on file  Stress: Not on file  Social Connections: Not on file  Intimate Partner Violence: Not on file    Physical Exam: Vital signs in last 24 hours: BP (!) 180/98   Pulse 75   Temp 98.1 F (36.7 C)   Ht 5' 3.25" (1.607 m)   Wt 176 lb (79.8 kg)   SpO2 98%   BMI 30.93 kg/m  GEN: NAD EYE: Sclerae anicteric ENT: MMM CV: Non-tachycardic Pulm: No increased WOB GI: Soft NEURO:  Alert & Oriented   Eulah Pont, MD  Gastroenterology   03/28/2023 8:39 AM

## 2023-03-29 ENCOUNTER — Telehealth: Payer: Self-pay

## 2023-03-29 NOTE — Telephone Encounter (Signed)
Follow up call to pt, vm box is full; unable to leave message.

## 2023-04-10 ENCOUNTER — Encounter: Payer: Self-pay | Admitting: Internal Medicine

## 2023-07-02 ENCOUNTER — Encounter: Payer: Self-pay | Admitting: Internal Medicine

## 2023-07-02 ENCOUNTER — Other Ambulatory Visit (INDEPENDENT_AMBULATORY_CARE_PROVIDER_SITE_OTHER): Payer: PRIVATE HEALTH INSURANCE

## 2023-07-02 ENCOUNTER — Ambulatory Visit: Payer: PRIVATE HEALTH INSURANCE | Admitting: Internal Medicine

## 2023-07-02 VITALS — BP 150/100 | HR 84 | Ht 63.0 in | Wt 177.1 lb

## 2023-07-02 DIAGNOSIS — K219 Gastro-esophageal reflux disease without esophagitis: Secondary | ICD-10-CM

## 2023-07-02 DIAGNOSIS — R7989 Other specified abnormal findings of blood chemistry: Secondary | ICD-10-CM | POA: Diagnosis not present

## 2023-07-02 DIAGNOSIS — K449 Diaphragmatic hernia without obstruction or gangrene: Secondary | ICD-10-CM

## 2023-07-02 DIAGNOSIS — R1013 Epigastric pain: Secondary | ICD-10-CM

## 2023-07-02 DIAGNOSIS — R1310 Dysphagia, unspecified: Secondary | ICD-10-CM

## 2023-07-02 LAB — HEPATIC FUNCTION PANEL
ALT: 36 U/L — ABNORMAL HIGH (ref 0–35)
AST: 35 U/L (ref 0–37)
Albumin: 4 g/dL (ref 3.5–5.2)
Alkaline Phosphatase: 64 U/L (ref 39–117)
Bilirubin, Direct: 0.2 mg/dL (ref 0.0–0.3)
Total Bilirubin: 1.3 mg/dL — ABNORMAL HIGH (ref 0.2–1.2)
Total Protein: 7.7 g/dL (ref 6.0–8.3)

## 2023-07-02 MED ORDER — OMEPRAZOLE 40 MG PO CPDR: DELAYED_RELEASE_CAPSULE | ORAL | 3 refills | Status: AC

## 2023-07-02 NOTE — Progress Notes (Signed)
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 Christella Hartigan for this back in 2020. She is no longer on methotrexate because she needs to meet with her dermatologist to get the re-prescribed. The last time she took methotrexate was in early 2024.   Interval History: She is still has a little bit of trouble swallowing but this is improved. The dysphagia is better after her esophageal dilation. The omeprazole medication is helping a lot. Denies ab pain. Denies chest burning or regurgitation. She is not losing weight anymore. She is drinking alcohol about 2-3 times per week and is drinking 3 mixed alcoholic beverages each time. She does cough at night sometimes.  Denies feeling like her mouth is dry.  Wt Readings from Last 3 Encounters:  07/02/23 177 lb 2 oz (80.3 kg)  03/28/23 176 lb (79.8 kg)  03/06/23 176 lb 2 oz (79.9 kg)   Past Medical History:  Diagnosis Date   Childhood asthma    History of chicken pox    History of pulmonary embolism 10/2007   Hoarseness 12/12/2017   Hypertension    Lichen planus pigmentosus 04/11/2016   Lupus    skin   Past Surgical History:  Procedure Laterality Date   TOTAL ABDOMINAL HYSTERECTOMY W/ BILATERAL SALPINGOOPHORECTOMY     secondary to fibroids   Family History  Problem Relation Age of Onset   Prostate cancer Father        Survivor   Diabetes Father    Hypertension Father    Hypertension Sister    Cancer Brother        Renal cell carcinoma   Coronary artery disease Other    Colon cancer Neg Hx    Breast cancer Neg Hx    Stomach cancer Neg Hx    Social History   Tobacco Use   Smoking status: Never   Smokeless tobacco: Never  Vaping Use   Vaping status: Never Used  Substance Use Topics   Alcohol use: Yes    Alcohol/week: 6.0 standard drinks of alcohol    Types: 6  Shots of liquor per week   Drug use: No   Current Outpatient Medications  Medication Sig Dispense Refill   amLODipine (NORVASC) 5 MG tablet Take 1 tablet (5 mg total) by mouth every evening. 90 tablet 3   folic acid (FOLVITE) 1 MG tablet Take 1 mg by mouth daily.      losartan (COZAAR) 100 MG tablet Take 1 tablet (100 mg total) by mouth in the morning. 90 tablet 3   omeprazole (PRILOSEC) 20 MG capsule Take 1 capsule (20 mg total) by mouth daily. 90 capsule 3   omeprazole (PRILOSEC) 40 MG capsule Take 40 mg by mouth twice daily for 8 weeks, then decrease to once daily thereafter. 90 capsule 3   triamcinolone ointment (KENALOG) 0.1 % Apply topically 2 (two) times daily.     methotrexate (RHEUMATREX) 2.5 MG tablet Take 2.5 mg by mouth once a week.  (Patient not taking: Reported on 07/02/2023)     Current Facility-Administered Medications  Medication Dose Route Frequency Provider Last Rate Last Admin   0.9 %  sodium chloride infusion  500 mL Intravenous Once Imogene Burn, MD       Allergies  Allergen Reactions   Shellfish Allergy Anaphylaxis   Shrimp Extract Hives   Clarithromycin  Rash   Metronidazole Rash   Review of Systems: All systems reviewed and negative except where noted in HPI.   Physical Exam: BP (!) 150/100 (BP Location: Left Arm, Patient Position: Sitting, Cuff Size: Normal)   Pulse 84   Ht 5\' 3"  (1.6 m)   Wt 177 lb 2 oz (80.3 kg)   BMI 31.38 kg/m  Constitutional: Pleasant,well-developed, female in no acute distress. HEENT: Normocephalic and atraumatic. Conjunctivae are normal. No scleral icterus. Cardiovascular: Normal rate, regular rhythm.  Pulmonary/chest: Effort normal and breath sounds normal. No wheezing, rales or rhonchi. Abdominal: Soft, nondistended, mildly tender in the epigastric area. Bowel sounds active throughout. There are no masses palpable. No hepatomegaly. Extremities: No edema Neurological: Alert and oriented to person place and time. Skin: Skin  is warm and dry. No rashes noted. Psychiatric: Normal mood and affect. Behavior is normal.  Labs 03/2019: Hep A ab NR. Hep B surface antigen NR. Hep B surface antibody NR. Hep C antibody NR. Iron studies normal. ANA positive at 1:1280. AMA negative.  Labs 06/2022: CBC nml.   Labs 12/2022: CMP with elevated AST 53, ALT 60, elevated total bilirubin of 1.7.  Labs 02/2023: ASMA negative. IgG elevated at 1584. CBC with low WBC of 3.4. INR is mildly elevated at 1.1.   CT A/P w/contrast 07/05/22: IMPRESSION: Focal diverticulitis is seen involving the proximal sigmoid colon. No definite abscess formation is noted. Small sliding-type hiatal hernia  U/S abdomen w/contrast 03/11/23: IMPRESSION: ULTRASOUND ABDOMEN: Fatty liver infiltration.  No splenomegaly or ascites. No gallstones or ductal dilatation. ULTRASOUND HEPATIC ELASTOGRAPHY: Median kPa:  6.3 Diagnostic category: < or = 9 kPa: in the absence of other known clinical signs, rules out cACLD  Colonoscopy 09/05/12 with mild left sided diverticulosis  EGD 03/28/23: - Normal esophagus. Dilated. Biopsied. - Hiatal hernia. - Erythematous mucosa in the antrum. Biopsied. - Multiple duodenal polyps. Biopsied. Path: 1. Surgical [P], duodenal polyp POLYPOID DUODENAL MUCOSA WITH FOVEOLAR METAPLASIA AND BRUNNER GLAND HYPERPLASIA CONSISTENT WITH POLYPOID CHRONIC PEPTIC DUODENITIS. NEGATIVE FOR DYSPLASIA. 2. Surgical [P], gastric GASTRIC ANTRAL / OXYNTIC MUCOSA WITH FOCAL MILD CHRONIC INACTIVE GASTRITIS. NO H. PYLORI IDENTIFIED ON H&E. NEGATIVE FOR INTESTINAL METAPLASIA OR DYSPLASIA. 3. Surgical [P], esophageal bx ESOPHAGEAL SQUAMOUS MUCOSA WITH NO SIGNIFICANT DIAGNOSTIC ALTERATION. NO EVIDENCE OF INTRAEPITHELIAL EOSINOPHILS OR LYMPHOCYTES.  Colonoscopy 03/28/23: - The examined portion of the ileum was normal. - Two 4 to 5 mm polyps in the transverse colon and in the ascending colon, removed with a cold snare. Resected and retrieved. - One 3 mm  polyp in the sigmoid colon, removed with a cold snare. Resected and retrieved. - Diverticulosis in the sigmoid colon. - Non- bleeding internal hemorrhoids. Path: 4. Surgical [P], colon, ascending, transverse, polyp (2) FRAGMENTS OF TUBULAR ADENOMA. NEGATIVE FOR HIGH-GRADE DYSPLASIA. 5. Surgical [P], colon, sigmoid, polyp (1) HYPERPLASIA POLYP. NEGATIVE FOR DYSPLASIA.  ASSESSMENT AND PLAN: Elevated LFTs GERD - improved Dysphagia - improved Epigastric and lower ab pain - improved Hiatal hernia Weight loss - resolved History of colon polyps Patient overall has had significant improvement in her abdominal pain, dysphagia, and acid reflux symptoms on omeprazole therapy and after esophageal dilation.  Will continue her on daily omeprazole therapy for now.  Patient has been working on reducing her alcohol intake and has been making meaningful strides in this.  Her last elastography did not show signs of advanced liver disease, which is reassuring.  Her previously elevated LFTs have been attributed to a combination of alcohol use and prior methotrexate  therapy.  There could also be a question of autoimmune hepatitis with the patient's elevated ANA and mildly elevated IgG in the past.  If the patient's LFTs were ever to increase more significantly in the future, could consider liver biopsy at that time.  In the meantime, we will continue surveillance of her elevated LFTs with interval labs. - Encourage alcohol cessation - Previously gave GERD handout - Check LFTs.  During next visit we will plan to check a CBC, CMP, and PT/INR. - Cont omeprazole 40 mg every day. Refill.  - Patient will reach out to dermatology to follow-up on her dermatologic lupus/lichen planus - Next colonoscopy due in 03/2030 for polyp surveillance - RTC 6 months  Eulah Pont, MD  I spent 35 minutes of time, including in depth chart review, independent review of results as outlined above, communicating results with the patient  directly, face-to-face time with the patient, coordinating care, ordering studies and medications as appropriate, and documentation.

## 2023-07-02 NOTE — Patient Instructions (Addendum)
Your provider has requested that you go to the basement level for lab work before leaving today. Press "B" on the elevator. The lab is located at the first door on the left as you exit the elevator.  We have sent the following medications to your pharmacy for you to pick up at your convenience: Omeprazole   If your blood pressure at your visit was 140/90 or greater, please contact your primary care physician to follow up on this.  _______________________________________________________  If you are age 101 or older, your body mass index should be between 23-30. Your Body mass index is 31.38 kg/m. If this is out of the aforementioned range listed, please consider follow up with your Primary Care Provider.  If you are age 54 or younger, your body mass index should be between 19-25. Your Body mass index is 31.38 kg/m. If this is out of the aformentioned range listed, please consider follow up with your Primary Care Provider.   ________________________________________________________  The [facility] providers would like to encourage you to use [facility] to communicate with providers for non-urgent requests or questions.  Due to long hold times on the telephone, sending your provider a message by [facility] may be a faster and more efficient way to get a response.  Please allow 48 business hours for a response.  Please remember that this is for non-urgent requests.  _______________________________________________________   Thank you for entrusting me with your care and for choosing [facility],  Dr. Eulah Pont

## 2023-07-08 ENCOUNTER — Other Ambulatory Visit: Payer: Self-pay | Admitting: Nurse Practitioner

## 2023-07-08 DIAGNOSIS — I10 Essential (primary) hypertension: Secondary | ICD-10-CM

## 2023-07-15 ENCOUNTER — Ambulatory Visit (INDEPENDENT_AMBULATORY_CARE_PROVIDER_SITE_OTHER): Payer: PRIVATE HEALTH INSURANCE | Admitting: Nurse Practitioner

## 2023-07-15 ENCOUNTER — Encounter: Payer: Self-pay | Admitting: Nurse Practitioner

## 2023-07-15 VITALS — BP 160/90 | HR 68 | Temp 98.2°F | Resp 18 | Wt 176.4 lb

## 2023-07-15 DIAGNOSIS — F1010 Alcohol abuse, uncomplicated: Secondary | ICD-10-CM

## 2023-07-15 DIAGNOSIS — I10 Essential (primary) hypertension: Secondary | ICD-10-CM

## 2023-07-15 DIAGNOSIS — E876 Hypokalemia: Secondary | ICD-10-CM

## 2023-07-15 DIAGNOSIS — E7800 Pure hypercholesterolemia, unspecified: Secondary | ICD-10-CM

## 2023-07-15 DIAGNOSIS — E782 Mixed hyperlipidemia: Secondary | ICD-10-CM | POA: Diagnosis not present

## 2023-07-15 LAB — BASIC METABOLIC PANEL
BUN: 17 mg/dL (ref 6–23)
CO2: 25 meq/L (ref 19–32)
Calcium: 9 mg/dL (ref 8.4–10.5)
Chloride: 104 meq/L (ref 96–112)
Creatinine, Ser: 0.8 mg/dL (ref 0.40–1.20)
GFR: 79.14 mL/min (ref >60.00)
Glucose, Bld: 90 mg/dL (ref 70–99)
Potassium: 3.4 meq/L — ABNORMAL LOW (ref 3.5–5.1)
Sodium: 139 meq/L (ref 135–145)

## 2023-07-15 LAB — LIPID PANEL
Cholesterol: 277 mg/dL — ABNORMAL HIGH (ref 0–200)
HDL: 71.3 mg/dL (ref >39.00)
LDL Cholesterol: 190 mg/dL — ABNORMAL HIGH (ref 0–99)
NonHDL: 206.09
Total CHOL/HDL Ratio: 4
Triglycerides: 82 mg/dL (ref 0.0–149.0)
VLDL: 16.4 mg/dL (ref 0.0–40.0)

## 2023-07-15 NOTE — Assessment & Plan Note (Signed)
Repeat lipid panel ?

## 2023-07-15 NOTE — Assessment & Plan Note (Signed)
3shots of vodka per day Increased ALCOHOL consumption with recent death of brother Declined referral to therapist. Declined help with ALCOHOL cessation.

## 2023-07-15 NOTE — Progress Notes (Signed)
Established Patient Visit  Patient: Jasmine Watson   DOB: Dec 24, 1960   62 y.o. Female  MRN: [id number] Visit Date: 07/15/2023  Subjective:    Chief Complaint  Patient presents with   OFFICE VISIT     6 month follow up    Hyperlipidemia   Hypertension   Hyperlipidemia  Hypertension   Primary hypertension Continuous med non compliance-reports she forgets to take amlodipine in PM. States she has been compliant with losartan dose. BP Readings from Last 3 Encounters:  07/15/23 (!) 160/90  07/02/23 (!) 150/100  03/28/23 (!) 173/94    Advised to take losartan and amlodipine in AM Maintain DASH diet Repeat BMP F/up in 343month  Hypercholesterolemia Repeat lipid panel  ETOH abuse 3shots of vodka per day Increased ALCOHOL consumption with recent death of brother Declined referral to therapist. Declined help with ALCOHOL cessation.  Reviewed medical, surgical, and social history today  Medications: Outpatient Medications Prior to Visit  Medication Sig Note   amLODipine (NORVASC) 5 MG tablet Take 1 tablet (5 mg total) by mouth every evening.    folic acid (FOLVITE) 1 MG tablet Take 1 mg by mouth daily.     losartan (COZAAR) 100 MG tablet TAKE 1 TABLET (100 MG TOTAL) BY MOUTH IN THE MORNING    methotrexate (RHEUMATREX) 2.5 MG tablet Take 2.5 mg by mouth once a week.    omeprazole (PRILOSEC) 40 MG capsule Take 1 capsule daily 07/15/2023: Refills are needed   triamcinolone ointment (KENALOG) 0.1 % Apply topically 2 (two) times daily.    [DISCONTINUED] 0.9 %  sodium chloride infusion     No facility-administered medications prior to visit.   Reviewed past medical and social history.   ROS per HPI above      Objective:  BP (!) 160/90   Pulse 68   Temp 98.2 F (36.8 C) (Temporal)   Resp 18   Wt 176 lb 6.4 oz (80 kg)   SpO2 99%   BMI 31.25 kg/m      Physical Exam Vitals and nursing note reviewed.  Cardiovascular:     Rate and Rhythm: Normal rate  and regular rhythm.     Pulses: Normal pulses.     Heart sounds: Normal heart sounds.  Pulmonary:     Effort: Pulmonary effort is normal.     Breath sounds: Normal breath sounds.  Neurological:     Mental Status: She is alert and oriented to person, place, and time.     No results found for any visits on 07/15/23.    Assessment & Plan:    Problem List Items Addressed This Visit     ETOH abuse    3shots of vodka per day Increased ALCOHOL consumption with recent death of brother Declined referral to therapist. Declined help with ALCOHOL cessation.      Hypercholesterolemia - Primary    Repeat lipid panel      Relevant Orders   Lipid panel   Primary hypertension    Continuous med non compliance-reports she forgets to take amlodipine in PM. States she has been compliant with losartan dose. BP Readings from Last 3 Encounters:  07/15/23 (!) 160/90  07/02/23 (!) 150/100  03/28/23 (!) 173/94    Advised to take losartan and amlodipine in AM Maintain DASH diet Repeat BMP F/up in 343month      Relevant Orders   Basic metabolic panel   Return in about 4  weeks (around 08/12/2023) for HTN.     Alysia Penna, NP

## 2023-07-15 NOTE — Assessment & Plan Note (Signed)
Continuous med non compliance-reports she forgets to take amlodipine in PM. States she has been compliant with losartan dose. BP Readings from Last 3 Encounters:  07/15/23 (!) 160/90  07/02/23 (!) 150/100  03/28/23 (!) 173/94    Advised to take losartan and amlodipine in AM Maintain DASH diet Repeat BMP F/up in 64month

## 2023-07-15 NOTE — Patient Instructions (Signed)
Go to lab Continue Heart healthy diet and daily exercise. Maintain current medications.

## 2023-07-16 MED ORDER — ROSUVASTATIN CALCIUM 20 MG PO TABS: 20.0000 mg | ORAL_TABLET | Freq: Every day | ORAL | 3 refills | Status: AC

## 2023-07-16 MED ORDER — POTASSIUM CHLORIDE CRYS ER 20 MEQ PO TBCR: 20.0000 meq | EXTENDED_RELEASE_TABLET | Freq: Every day | ORAL | 0 refills | Status: AC

## 2023-07-16 NOTE — Addendum Note (Signed)
Addended by: Michaela Corner on: 07/16/2023 09:14 AM   Modules accepted: Orders

## 2023-08-27 ENCOUNTER — Ambulatory Visit: Payer: PRIVATE HEALTH INSURANCE | Admitting: Nurse Practitioner

## 2023-10-02 DIAGNOSIS — Z419 Encounter for procedure for purposes other than remedying health state, unspecified: Secondary | ICD-10-CM | POA: Diagnosis not present

## 2023-10-07 ENCOUNTER — Encounter: Payer: Self-pay | Admitting: Internal Medicine

## 2023-10-19 DIAGNOSIS — Z419 Encounter for procedure for purposes other than remedying health state, unspecified: Secondary | ICD-10-CM | POA: Diagnosis not present

## 2023-11-30 DIAGNOSIS — Z419 Encounter for procedure for purposes other than remedying health state, unspecified: Secondary | ICD-10-CM | POA: Diagnosis not present

## 2023-12-30 DIAGNOSIS — Z419 Encounter for procedure for purposes other than remedying health state, unspecified: Secondary | ICD-10-CM | POA: Diagnosis not present

## 2024-01-30 DIAGNOSIS — Z419 Encounter for procedure for purposes other than remedying health state, unspecified: Secondary | ICD-10-CM | POA: Diagnosis not present

## 2024-02-29 DIAGNOSIS — Z419 Encounter for procedure for purposes other than remedying health state, unspecified: Secondary | ICD-10-CM | POA: Diagnosis not present

## 2024-03-31 DIAGNOSIS — Z419 Encounter for procedure for purposes other than remedying health state, unspecified: Secondary | ICD-10-CM | POA: Diagnosis not present

## 2024-05-01 DIAGNOSIS — Z419 Encounter for procedure for purposes other than remedying health state, unspecified: Secondary | ICD-10-CM | POA: Diagnosis not present

## 2024-07-01 DIAGNOSIS — Z419 Encounter for procedure for purposes other than remedying health state, unspecified: Secondary | ICD-10-CM | POA: Diagnosis not present
